# Patient Record
Sex: Female | Born: 1945
Health system: Southern US, Community
[De-identification: ages and names within clinical notes are randomized; demographics above are authoritative.]

## PROBLEM LIST (undated history)

## (undated) DIAGNOSIS — C50412 Malignant neoplasm of upper-outer quadrant of left female breast: Secondary | ICD-10-CM

## (undated) DIAGNOSIS — K5792 Diverticulitis of intestine, part unspecified, without perforation or abscess without bleeding: Secondary | ICD-10-CM

## (undated) DIAGNOSIS — T8859XA Other complications of anesthesia, initial encounter: Secondary | ICD-10-CM

## (undated) DIAGNOSIS — C50919 Malignant neoplasm of unspecified site of unspecified female breast: Secondary | ICD-10-CM

## (undated) DIAGNOSIS — Z9889 Other specified postprocedural states: Secondary | ICD-10-CM

## (undated) DIAGNOSIS — M899 Disorder of bone, unspecified: Secondary | ICD-10-CM

## (undated) DIAGNOSIS — K2100 Gastro-esophageal reflux disease with esophagitis, without bleeding: Secondary | ICD-10-CM

## (undated) DIAGNOSIS — M949 Disorder of cartilage, unspecified: Secondary | ICD-10-CM

## (undated) DIAGNOSIS — M545 Low back pain, unspecified: Secondary | ICD-10-CM

## (undated) DIAGNOSIS — IMO0001 Reserved for inherently not codable concepts without codable children: Secondary | ICD-10-CM

## (undated) DIAGNOSIS — M1990 Unspecified osteoarthritis, unspecified site: Secondary | ICD-10-CM

## (undated) DIAGNOSIS — E039 Hypothyroidism, unspecified: Secondary | ICD-10-CM

## (undated) DIAGNOSIS — M25559 Pain in unspecified hip: Secondary | ICD-10-CM

## (undated) DIAGNOSIS — R4701 Aphasia: Secondary | ICD-10-CM

## (undated) DIAGNOSIS — R0602 Shortness of breath: Secondary | ICD-10-CM

## (undated) DIAGNOSIS — N951 Menopausal and female climacteric states: Secondary | ICD-10-CM

## (undated) DIAGNOSIS — T4145XA Adverse effect of unspecified anesthetic, initial encounter: Secondary | ICD-10-CM

## (undated) HISTORY — PX: BREAST SURGERY: SHX581

## (undated) HISTORY — PX: TONSILLECTOMY: SUR1361

## (undated) HISTORY — DX: Diverticulitis of intestine, part unspecified, without perforation or abscess without bleeding: K57.92

## (undated) HISTORY — PX: ECTOPIC PREGNANCY SURGERY: SHX613

## (undated) HISTORY — PX: EYE SURGERY: SHX253

## (undated) HISTORY — PX: OVARIAN CYST SURGERY: SHX726

## (undated) HISTORY — DX: Menopausal and female climacteric states: N95.1

## (undated) HISTORY — DX: Low back pain, unspecified: M54.50

## (undated) HISTORY — DX: Low back pain: M54.5

## (undated) HISTORY — DX: Other specified postprocedural states: Z98.890

## (undated) HISTORY — PX: APPENDECTOMY: SHX54

## (undated) HISTORY — DX: Disorder of cartilage, unspecified: M94.9

## (undated) HISTORY — DX: Malignant neoplasm of unspecified site of unspecified female breast: C50.919

## (undated) HISTORY — DX: Disorder of bone, unspecified: M89.9

## (undated) HISTORY — DX: Malignant neoplasm of upper-outer quadrant of left female breast: C50.412

## (undated) HISTORY — DX: Unspecified osteoarthritis, unspecified site: M19.90

## (undated) HISTORY — DX: Aphasia: R47.01

## (undated) HISTORY — DX: Pain in unspecified hip: M25.559

## (undated) HISTORY — DX: Hypothyroidism, unspecified: E03.9

---

## 1976-04-29 HISTORY — PX: DILATION AND CURETTAGE OF UTERUS: SHX78

## 2003-08-26 ENCOUNTER — Ambulatory Visit (HOSPITAL_COMMUNITY): Admission: RE | Admit: 2003-08-26 | Discharge: 2003-08-26 | Payer: Self-pay | Admitting: *Deleted

## 2005-08-17 ENCOUNTER — Encounter: Admission: RE | Admit: 2005-08-17 | Discharge: 2005-08-17 | Payer: Self-pay | Admitting: Endocrinology

## 2010-04-29 HISTORY — PX: CATARACT EXTRACTION: SUR2

## 2010-07-31 ENCOUNTER — Other Ambulatory Visit: Payer: Self-pay | Admitting: Obstetrics and Gynecology

## 2010-08-07 ENCOUNTER — Ambulatory Visit (HOSPITAL_COMMUNITY)
Admission: RE | Admit: 2010-08-07 | Discharge: 2010-08-07 | Disposition: A | Discharge status: Home / Self Care | Payer: Medicare Other | Source: Ambulatory Visit | Attending: Obstetrics and Gynecology | Admitting: Obstetrics and Gynecology

## 2010-08-07 DIAGNOSIS — M47817 Spondylosis without myelopathy or radiculopathy, lumbosacral region: Secondary | ICD-10-CM | POA: Insufficient documentation

## 2010-08-07 DIAGNOSIS — R142 Eructation: Secondary | ICD-10-CM | POA: Insufficient documentation

## 2010-08-07 DIAGNOSIS — Q618 Other cystic kidney diseases: Secondary | ICD-10-CM | POA: Insufficient documentation

## 2010-08-07 DIAGNOSIS — R109 Unspecified abdominal pain: Secondary | ICD-10-CM | POA: Insufficient documentation

## 2010-08-07 DIAGNOSIS — R141 Gas pain: Secondary | ICD-10-CM | POA: Insufficient documentation

## 2010-08-07 DIAGNOSIS — I700 Atherosclerosis of aorta: Secondary | ICD-10-CM | POA: Insufficient documentation

## 2010-08-07 DIAGNOSIS — K5730 Diverticulosis of large intestine without perforation or abscess without bleeding: Secondary | ICD-10-CM | POA: Insufficient documentation

## 2010-08-07 MED ORDER — IOHEXOL 300 MG/ML  SOLN
100.0000 mL | Freq: Once | INTRAMUSCULAR | Status: AC | PRN
  Administered 2010-08-07: 100 mL via INTRAVENOUS

## 2011-04-30 DIAGNOSIS — Z9889 Other specified postprocedural states: Secondary | ICD-10-CM

## 2011-04-30 HISTORY — DX: Other specified postprocedural states: Z98.890

## 2011-06-19 DIAGNOSIS — Z1231 Encounter for screening mammogram for malignant neoplasm of breast: Secondary | ICD-10-CM | POA: Diagnosis not present

## 2011-06-20 DIAGNOSIS — R928 Other abnormal and inconclusive findings on diagnostic imaging of breast: Secondary | ICD-10-CM | POA: Diagnosis not present

## 2011-06-24 ENCOUNTER — Other Ambulatory Visit: Payer: Self-pay

## 2011-06-24 DIAGNOSIS — R928 Other abnormal and inconclusive findings on diagnostic imaging of breast: Secondary | ICD-10-CM | POA: Diagnosis not present

## 2011-06-24 DIAGNOSIS — Z0189 Encounter for other specified special examinations: Secondary | ICD-10-CM | POA: Diagnosis not present

## 2011-06-24 DIAGNOSIS — C50919 Malignant neoplasm of unspecified site of unspecified female breast: Secondary | ICD-10-CM | POA: Diagnosis not present

## 2011-06-25 ENCOUNTER — Other Ambulatory Visit: Payer: Self-pay | Admitting: Radiology

## 2011-06-25 DIAGNOSIS — D249 Benign neoplasm of unspecified breast: Secondary | ICD-10-CM | POA: Diagnosis not present

## 2011-06-25 DIAGNOSIS — C50912 Malignant neoplasm of unspecified site of left female breast: Secondary | ICD-10-CM

## 2011-06-27 ENCOUNTER — Other Ambulatory Visit: Payer: Self-pay | Admitting: *Deleted

## 2011-06-27 ENCOUNTER — Telehealth: Payer: Self-pay | Admitting: *Deleted

## 2011-06-27 DIAGNOSIS — C50419 Malignant neoplasm of upper-outer quadrant of unspecified female breast: Secondary | ICD-10-CM

## 2011-06-27 NOTE — Telephone Encounter (Signed)
Confirmed [facility] for 07/03/11 at 1200 .  Instructions and contact information given.  

## 2011-07-01 ENCOUNTER — Ambulatory Visit
Admission: RE | Admit: 2011-07-01 | Discharge: 2011-07-01 | Disposition: A | Discharge status: Home / Self Care | Payer: Medicare Other | Source: Ambulatory Visit | Attending: Radiology | Admitting: Radiology

## 2011-07-01 DIAGNOSIS — C50912 Malignant neoplasm of unspecified site of left female breast: Secondary | ICD-10-CM

## 2011-07-01 DIAGNOSIS — N63 Unspecified lump in unspecified breast: Secondary | ICD-10-CM | POA: Diagnosis not present

## 2011-07-01 DIAGNOSIS — C50919 Malignant neoplasm of unspecified site of unspecified female breast: Secondary | ICD-10-CM | POA: Diagnosis not present

## 2011-07-01 MED ORDER — GADOBENATE DIMEGLUMINE 529 MG/ML IV SOLN
14.0000 mL | Freq: Once | INTRAVENOUS | Status: AC | PRN
  Administered 2011-07-01: 14 mL via INTRAVENOUS

## 2011-07-02 ENCOUNTER — Other Ambulatory Visit: Payer: Self-pay | Admitting: Radiology

## 2011-07-02 DIAGNOSIS — R928 Other abnormal and inconclusive findings on diagnostic imaging of breast: Secondary | ICD-10-CM

## 2011-07-02 DIAGNOSIS — Z0189 Encounter for other specified special examinations: Secondary | ICD-10-CM | POA: Diagnosis not present

## 2011-07-02 DIAGNOSIS — C50919 Malignant neoplasm of unspecified site of unspecified female breast: Secondary | ICD-10-CM | POA: Diagnosis not present

## 2011-07-02 DIAGNOSIS — N63 Unspecified lump in unspecified breast: Secondary | ICD-10-CM | POA: Diagnosis not present

## 2011-07-02 DIAGNOSIS — D249 Benign neoplasm of unspecified breast: Secondary | ICD-10-CM | POA: Diagnosis not present

## 2011-07-03 ENCOUNTER — Ambulatory Visit (HOSPITAL_BASED_OUTPATIENT_CLINIC_OR_DEPARTMENT_OTHER): Payer: Medicare Other | Admitting: Oncology

## 2011-07-03 ENCOUNTER — Ambulatory Visit: Payer: Medicare Other

## 2011-07-03 ENCOUNTER — Encounter (INDEPENDENT_AMBULATORY_CARE_PROVIDER_SITE_OTHER): Payer: Self-pay | Admitting: General Surgery

## 2011-07-03 ENCOUNTER — Ambulatory Visit (HOSPITAL_BASED_OUTPATIENT_CLINIC_OR_DEPARTMENT_OTHER): Payer: Medicare Other | Admitting: General Surgery

## 2011-07-03 ENCOUNTER — Ambulatory Visit: Payer: Medicare Other | Attending: General Surgery | Admitting: Physical Therapy

## 2011-07-03 ENCOUNTER — Encounter: Payer: Self-pay | Admitting: Oncology

## 2011-07-03 ENCOUNTER — Ambulatory Visit
Admission: RE | Admit: 2011-07-03 | Discharge: 2011-07-03 | Disposition: A | Discharge status: Home / Self Care | Payer: Medicare Other | Source: Ambulatory Visit | Attending: Radiation Oncology | Admitting: Radiation Oncology

## 2011-07-03 ENCOUNTER — Other Ambulatory Visit (HOSPITAL_BASED_OUTPATIENT_CLINIC_OR_DEPARTMENT_OTHER): Payer: Medicare Other | Admitting: Lab

## 2011-07-03 VITALS — BP 138/78 | HR 76 | Temp 98.1°F | Ht 62.3 in | Wt 153.2 lb

## 2011-07-03 DIAGNOSIS — C50919 Malignant neoplasm of unspecified site of unspecified female breast: Secondary | ICD-10-CM | POA: Diagnosis not present

## 2011-07-03 DIAGNOSIS — D249 Benign neoplasm of unspecified breast: Secondary | ICD-10-CM | POA: Diagnosis not present

## 2011-07-03 DIAGNOSIS — R293 Abnormal posture: Secondary | ICD-10-CM | POA: Diagnosis not present

## 2011-07-03 DIAGNOSIS — C50419 Malignant neoplasm of upper-outer quadrant of unspecified female breast: Secondary | ICD-10-CM

## 2011-07-03 DIAGNOSIS — IMO0001 Reserved for inherently not codable concepts without codable children: Secondary | ICD-10-CM | POA: Diagnosis not present

## 2011-07-03 LAB — CBC WITH DIFFERENTIAL/PLATELET
BASO%: 0.5 % (ref 0.0–2.0)
Eosinophils Absolute: 0.1 10*3/uL (ref 0.0–0.5)
HCT: 40.9 % (ref 34.8–46.6)
MCHC: 33.5 g/dL (ref 31.5–36.0)
MONO%: 6.1 % (ref 0.0–14.0)
NEUT#: 2.8 10*3/uL (ref 1.5–6.5)
NEUT%: 60.9 % (ref 38.4–76.8)
RBC: 4.42 10*6/uL (ref 3.70–5.45)

## 2011-07-03 LAB — COMPREHENSIVE METABOLIC PANEL
ALT: 9 U/L (ref 0–35)
AST: 14 U/L (ref 0–37)
Alkaline Phosphatase: 60 U/L (ref 39–117)
CO2: 27 mEq/L (ref 19–32)
Calcium: 9.2 mg/dL (ref 8.4–10.5)
Chloride: 103 mEq/L (ref 96–112)
Creatinine, Ser: 0.77 mg/dL (ref 0.50–1.10)
Potassium: 4.3 mEq/L (ref 3.5–5.3)
Sodium: 138 mEq/L (ref 135–145)
Total Protein: 7.4 g/dL (ref 6.0–8.3)

## 2011-07-03 LAB — CANCER ANTIGEN 27.29: CA 27.29: 10 U/mL (ref 0–39)

## 2011-07-03 NOTE — Progress Notes (Signed)
Patient ID: Sylvia [name], female   DOB: 1945-10-27, 66 y.o.   MRN: [id number]  Chief Complaint  Patient presents with  . Other    Breast cancer    HPI Sylvia [name] is a 66 y.o. female.  Referred by Dr. Anselmo Pickler HPI This is a 67 yof who underwent a screening mammogram that showed a 1 cm mass in the lateral aspect of the left breast.  There are no right breast abnormalities.  She has no complaints referable to her breasts with no masses, discharge or tenderness.  Ultrasound shows a 13x10x8 mm mass.  She underwent ultrasound guided biopsy with clip placement.  Her pathology shows IDC with DCIS.  This is grade II.  This is ER positive 62%, PR negative, Ki67 is 12%, and Her2neu is not amplified.  She has also since undergone an MRI that showsa 2x1.5x1.5 cm irregular enhancing mass in the left breast.  There is an irregular 1.1x0.9x0.9cm located within the upper inner quadrant of left breast.  Also located centrally within the right breast in superior subareolar location at 12 o'clock is 1.3x0.6x0.6cm area of enhancement.  This has undergone a biopsy with finding of intraductal papilloma.  She comes in today to [facility] to discuss options.  Past Medical History  Diagnosis Date  . Hypothyroid     Past Surgical History  Procedure Date  . Cataract extraction 2012  . Abdominal hysterectomy   . Dilation and curettage of uterus 1978    Family History  Problem Relation Age of Onset  . Cancer Sister   . Cancer Brother   . Cancer Maternal Aunt   . Cancer Cousin   . Cancer Cousin     Social History History  Substance Use Topics  . Smoking status: Former Smoker -- 0.2 packs/day  . Smokeless tobacco: Former Neurosurgeon    Quit date: 04/29/1978  . Alcohol Use: No    Allergies  Allergen Reactions  . Benadryl (Diphenhydramine Hcl) Other (See Comments)    "Super hyper"  . Tape     Skin breakdown/redness   . Codeine Other (See Comments)    "can't wake up from it."    Current  Outpatient Prescriptions  Medication Sig Dispense Refill  . Calcium Carbonate-Vit D-Min (CALTRATE 600+D PLUS) 600-400 MG-UNIT per tablet Take 2 tablets by mouth daily.      . fish oil-omega-3 fatty acids 1000 MG capsule Take 2 g by mouth daily.      . Flaxseed, Linseed, (FLAXSEED OIL PO) Take 2 capsules by mouth daily.      Marland Kitchen GLUCOSA-CHONDR-NA CHONDR-MSM PO Take 2 tablets by mouth.      Marland Kitchen ibuprofen (ADVIL,MOTRIN) 100 MG tablet Take 200 mg by mouth every 6 (six) hours as needed.      Marland Kitchen levothyroxine (SYNTHROID, LEVOTHROID) 137 MCG tablet Take 137 mcg by mouth daily.        Review of Systems Review of Systems  Constitutional: Negative for fever, chills and unexpected weight change.  HENT: Positive for sinus pressure. Negative for hearing loss, congestion, sore throat, trouble swallowing and voice change.   Eyes: Negative for visual disturbance.  Respiratory: Positive for shortness of breath. Negative for cough and wheezing.   Cardiovascular: Negative for chest pain, palpitations and leg swelling.  Gastrointestinal: Negative for nausea, vomiting, abdominal pain, diarrhea, constipation, blood in stool, abdominal distention and anal bleeding.  Genitourinary: Negative for hematuria, vaginal bleeding and difficulty urinating.  Musculoskeletal: Positive for back pain. Negative for arthralgias.  Skin:  Negative for rash and wound.  Neurological: Negative for seizures, syncope and headaches.  Hematological: Negative for adenopathy. Does not bruise/bleed easily.  Psychiatric/Behavioral: Negative for confusion.    Wt Readings from Last 3 Encounters:  07/03/11 153 lb 3.2 oz (69.491 kg)   Temp Readings from Last 3 Encounters:  07/03/11 98.1 F (36.7 C)    BP Readings from Last 3 Encounters:  07/03/11 138/78   Pulse Readings from Last 3 Encounters:  07/03/11 76    Physical Exam Physical Exam  Vitals reviewed. Constitutional: She appears well-developed and well-nourished.  Neck: Neck  supple.  Cardiovascular: Normal rate, regular rhythm and normal heart sounds.   Pulmonary/Chest: Effort normal and breath sounds normal. She has no wheezes. She has no rales. Right breast exhibits no inverted nipple, no mass, no nipple discharge, no skin change and no tenderness. Left breast exhibits no inverted nipple, no mass, no nipple discharge, no skin change and no tenderness. Breasts are symmetrical.  Abdominal: Soft. There is no hepatomegaly.  Lymphadenopathy:    She has no cervical adenopathy.    Data Reviewed BILATERAL BREAST MRI WITH AND WITHOUT CONTRAST  Technique: Multiplanar, multisequence MR images of both breasts  were obtained prior to and following the intravenous administration  of 14ml of Multihance. Three dimensional images were evaluated at  the independent DynaCad workstation.  Comparison: 06/20/2011 and 06/23/2012 left breast ultrasound from  [facility] imaging. 06/27/2011, 06/13/2010, 05/01/2009, and 04/08/2008  digital mammograms from [facility] imaging.  Findings: There is diffuse bilateral multinodular parenchymal  enhancement with numerous tiny nodules present bilaterally . There  is a 2.0 x 1.5 x 1.5 cm oval mildly irregular enhancing mass with  central clip artifact located within the left breast located  slightly lateral and superior to the nipple at the 2 o'clock  position within the posterior third of the breast. This is  associated with plateau enhancement kinetics. This corresponds to  the recently biopsied left breast mass (ultrasound guidance). In  addition, there is an irregular 1.1 x 0.9 x 0.9 cm enhancing mass  located within the upper inner quadrant of the left breast with  plateau enhancement kinetics. This, also, is a worrisome mass and  second look ultrasound is recommended for further evaluation. If  no ultrasound correlate is found, and breast conservation is  planned, MR guided biopsy of this lesion is recommended.  Also, located centrally within  the right breast in a superior  subareolar location at the 12 o'clock position is an area of  nodular and clumped linear enhancement which measures 1.3 x 0.6 x  0.6 cm in size. This may represent an area of DCIS and second look  ultrasound of this area is recommended. If no ultrasound correlate  is found, MR guided biopsy is recommended.  IMPRESSION:  1. 2.0 cm enhancing mass within the left breast located at the 2  o'clock position with clip artifact corresponding to the recently  diagnosed invasive mammary carcinoma.  2. 1.1 cm irregular enhancing mass located within the upper inner  quadrant of the left breast. Second look ultrasound of this area  is recommended. If no ultrasound correlate is found, and breast  conservation is planned, recommend MR guided biopsy of this lesion.  3. 1.3 cm area of nodular and clumped linear enhancement located  within the superior subareolar portion of the right breast at 12  o'clock position worrisome for possible DCIS. Second look  ultrasound is recommended. If no ultrasound correlate is found, MR  guided biopsy is  recommended.    Assessment    Left breast cancer Right intraductal papilloma    Plan    She is due to get a left breast biopsy of the second lesion.  All of her options were discussed today but will wait until these final results are back. She does need a right breast wire guided excisional biopsy for the papilloma. We discussed this might be a cancer and that is reason for biopsy. We also discussed a left axillary sentinel node biopsy and the possibility of lumpectomy, lumpectomy and wire guided excisional biopsy of other area or mastectomy based on pathology from biopsy.   We discussed the staging and pathophysiology of breast cancer. We discussed all of the different options for treatment for breast cancer including surgery, chemotherapy, radiation therapy, Herceptin, and antiestrogen therapy.   We discussed a sentinel lymph node  biopsy as she does not appear to having lymph node involvement right now. We discussed the performance of that with injection of radioactive tracer and blue dye. We discussed that she would have an incision underneath her axillary hairline. We discussed that there is a bout a 10-20% chance of having a positive node with a sentinel lymph node biopsy and we will await the permanent pathology to make any other first further decisions in terms of her treatment. One of these options might be to return to the operating room to perform an axillary lymph node dissection. We discussed about a 1-2% risk lifetime of chronic shoulder pain as well as lymphedema associated with a sentinel lymph node biopsy.  We discussed the options for treatment of the breast cancer which included lumpectomy versus a mastectomy. We discussed the performance of the lumpectomy with a wire placement. We discussed a 10-20% chance of a positive margin requiring reexcision in the operating room. We also discussed that she may need radiation therapy or antiestrogen therapy or both if she undergoes lumpectomy. We discussed the mastectomy and the postoperative care for that as well. We discussed that there is no difference in her survival whether she undergoes lumpectomy with radiation therapy or antiestrogen therapy versus a mastectomy. There is a slight difference in the local recurrence rate being 3-5% with lumpectomy and about 1% with a mastectomy. We discussed the risks of operation including bleeding, infection, possible reoperation. She understands her further therapy will be based on what her stages at the time of her operation.         Welles Walthall 07/03/2011, 4:13 PM

## 2011-07-03 NOTE — Progress Notes (Signed)
Referral MD Dr Ernestina Patches Dr Karlene Lineman Dr Ewell Poe Reason for Referral: Abnormality detected on mammogram    Chief Complaint  Patient presents with  . Breast Cancer    HPI: 66 year old woman in good health, who presents with an abnormal mammogram and a  positive breast biopsy for cancer . She had a screening mammogram on 06/19/2011 which showed a 1 cm mass lateral aspect of the left breast. Ultrasound confirmed the presence of a 1.3 x 1 x 0.8 cm lobulated well-circumscribed mass at 3:00 4 cm from the nipple. Biopsy was recommended. Biopsy revealed a grade 2 ductal cancer ER +62% PR -0% proliferative index 12%. HER-2 was negative the ratio of 1. Bilateral MRI scan performed 07/01/2011 showed bilateral multinodular parenchymal enhancement with a 2 x 1.5 x 1.5 cm irregular enhancing mass left breast at the 2:00 position with corresponding to the recently biopsied mass. Initial enhancing mass was seen in the upper-outer quadrant of the left breast second look ultrasound is recommended. In addition in the right breast in 1.3 cm area of nodular clumped linear enhancement was located in the superior subareolar portion of the right breast second look ultrasound is recommended. A subsequent biopsy of the right side which showed intraductal papilloma.   Past Medical History  Diagnosis Date  . Hypothyroid   :  Past Surgical History  Procedure Date  . Cataract extraction 2012  . Abdominal hysterectomy   . Dilation and curettage of uterus 1978  :  Current outpatient prescriptions:Calcium Carbonate-Vit D-Min (CALTRATE 600+D PLUS) 600-400 MG-UNIT per tablet, Take 2 tablets by mouth daily., Disp: , Rfl: ;  fish oil-omega-3 fatty acids 1000 MG capsule, Take 2 g by mouth daily., Disp: , Rfl: ;  Flaxseed, Linseed, (FLAXSEED OIL PO), Take 2 capsules by mouth daily., Disp: , Rfl: ;  GLUCOSA-CHONDR-NA CHONDR-MSM PO, Take 2 tablets by mouth., Disp: , Rfl:  ibuprofen (ADVIL,MOTRIN) 100 MG tablet, Take 200 mg by mouth  every 6 (six) hours as needed., Disp: , Rfl: ;  levothyroxine (SYNTHROID, LEVOTHROID) 137 MCG tablet, Take 137 mcg by mouth daily., Disp: , Rfl: :    :  Allergies  Allergen Reactions  . Benadryl (Diphenhydramine Hcl) Other (See Comments)    "Super hyper"  . Tape     Skin breakdown/redness   . Codeine Other (See Comments)    "can't wake up from it."  :  Family History  Problem Relation Age of Onset  . Cancer Sister   . Cancer Brother   . Cancer breast Maternal Aunt in 13's   . Cancer breast Cousin 40-50   . Cancer Cousin   :  History   Social History  . 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 Years of Education: N/A   Occupational History  . - bookkeeper;    Social History Main Topics  . Smoking status: Former Smoker -- 0.2 packs/day  . Smokeless tobacco: Former Neurosurgeon    Quit date: 04/29/1978  . Alcohol Use: No  . Drug Use: No  . Sexually Active:    Other Topics Concern  . Not on file   Social History Narrative  . No narrative on file  :  @Reproductive  History Menarche age10 Menopause at hysterecotmy HRT x 7 y   A comprehensive review of systems was negative except for: Pt c/o soboe No recent weight changes or B symptoms  Exam:  @  IPVITALS@ General appearance: alert, cooperative and appears stated age Eyes: conjunctivae/corneas clear. PERRL, EOM's intact. Fundi benign. Throat: lips, mucosa, and tongue normal; teeth and gums normal Neck: no adenopathy, no carotid bruit, no JVD, supple, symmetrical, trachea midline and thyroid not enlarged, symmetric, no tenderness/mass/nodules Resp: clear to auscultation bilaterally and normal percussion bilaterally Chest wall: no tenderness Breasts: ecchymoses both breasts; fibrocystic type changes on both breasts Cardio: regular rate and rhythm, S1, S2 normal, no murmur, click, rub or gallop and normal apical impulse GI: soft,  non-tender; bowel sounds normal; no masses,  no organomegaly Extremities: extremities normal, atraumatic, no cyanosis or edema Pulses: 2+ and symmetric Lymph nodes: Cervical, supraclavicular, and axillary nodes normal. Neurologic: Grossly normal   Basename 07/03/11 1155  WBC 4.7  HGB 13.7  HCT 40.9  PLT 243    Basename 07/03/11 1155  NA 138  K 4.3  CL 103  CO2 27  GLUCOSE 90  BUN 9  CREATININE 0.77  CALCIUM 9.2    Blood smear review: n/a  Pathology: as above  Mr Breast Bilateral W Wo Contrast  07/01/2011  *RADIOLOGY REPORT*  Clinical Data: Recently diagnosed left breast invasive mammary carcinoma on ultrasound guided core biopsy.  Preoperative evaluation.  BUN and creatinine were obtained on site at [facility] at [address] Results:  BUN 8 mg/dL,  Creatinine 0.8 mg/dL.  BILATERAL BREAST MRI WITH AND WITHOUT CONTRAST  Technique: Multiplanar, multisequence MR images of both breasts were obtained prior to and following the intravenous administration of 14ml of Multihance.  Three dimensional images were evaluated at the independent DynaCad workstation.  Comparison:  06/20/2011 and 06/23/2012 left breast ultrasound from [facility] imaging.  06/27/2011, 06/13/2010, 05/01/2009, and 04/08/2008 digital mammograms from [facility] imaging.  Findings: There is diffuse bilateral multinodular parenchymal enhancement with numerous tiny nodules present bilaterally . There is a 2.0 x 1.5 x 1.5 cm oval mildly irregular enhancing mass with central clip artifact located within the left breast located slightly lateral and superior to the nipple at the 2 o'clock position within the posterior third of the breast. This is associated with plateau enhancement kinetics. This corresponds to the recently biopsied left breast mass (ultrasound guidance). In addition, there is an irregular 1.1 x 0.9 x 0.9 cm enhancing mass located within the upper inner quadrant of the left breast with plateau enhancement  kinetics.  This, also, is a worrisome mass and second look ultrasound is recommended for further evaluation.  If no ultrasound correlate is found,  and breast conservation is planned,  MR guided biopsy of this lesion is recommended.  Also, located centrally within the right breast in a superior subareolar location at the 12 o'clock position is an area of nodular and clumped linear enhancement which measures 1.3 x 0.6 x 0.6 cm in size.  This may represent an area of DCIS and second look ultrasound of this area is recommended.  If no ultrasound correlate is found, MR guided biopsy is recommended.  IMPRESSION:  1.  2.0 cm enhancing mass within the left breast located at the 2 o'clock position with clip artifact corresponding to the recently diagnosed invasive mammary carcinoma. 2.  1.1 cm irregular enhancing mass located within the upper inner quadrant of the left breast.  Second look ultrasound of this area is recommended.  If no ultrasound correlate is found, and breast conservation is planned, recommend MR guided biopsy of this lesion. 3.  1.3 cm area of nodular and clumped linear enhancement located within the superior subareolar  portion of the right breast at 12 o'clock position worrisome for possible DCIS.  Second look ultrasound is recommended.  If no ultrasound correlate is found, MR guided biopsy is recommended.  THREE-DIMENSIONAL MR IMAGE RENDERING ON INDEPENDENT WORKSTATION:  Three-dimensional MR images were rendered by post-processing of the original MR data on an independent workstation.  The three- dimensional MR images were interpreted, and findings were reported in the accompanying complete MRI report for this study.  BI-RADS CATEGORY 4:  Suspicious abnormality - biopsy should be considered.  Original Report Authenticated By: Rolla Plate, M.D.    Assessment and Plan:  66 year old woman with history of left-sided ER/PR positive invasive breast cancer. There is an additional lesion in the upper  quadrant that requires biopsy. In contralateral breast there is a papilloma needs to be resected as well. I spent about 45 minutes today with the patient and her family discussed the role of adjuvant chemotherapy and hormonal therapy.  In addition I outlined the role of the Oncotype test that would be performed on her invasive specimen to determine whether or not she would benefit from chemotherapy.  I plan to see her back in followup after she's had definitive surgery and Oncotype testing. In the interim we'll go ahead with a followup bone density test. She may also benefit from staging scans and we'll await results of her final pathology before ordering those.   Pierce Crane M.D. FRCP C.  45 minutes was spent with this patient half the time and patient-related counseling

## 2011-07-03 NOTE — Progress Notes (Signed)
Addended byEmelia Loron on: 07/03/2011 04:39 PM   Modules accepted: Orders

## 2011-07-03 NOTE — Progress Notes (Signed)
[facility] Radiation Oncology NEW PATIENT EVALUATION  Name: Sylvia [name] MRN: [id number]  Date: 07/03/2011  DOB: Nov 28, 1945  Status: outpatient   CC:   Emelia Loron, MD , Dr. Pierce Crane, Dr. Bufford Spikes, Dr. Kirkland Hun   REFERRING PHYSICIAN: Emelia Loron, MD   DIAGNOSIS: Stage I A. (T1, N0, M0) invasive ductal carcinoma the left breast    HISTORY OF PRESENT ILLNESS:  Sylvia [name] is a 66 y.o. female who is seen today at the [facility] for evaluation of her stage I 8 (T1, N0, M0) invasive ductal carcinoma the left breast. At the time of a screening mammogram at [facility] on 06/19/2011 she is node have a 1 cm mass along lateral aspect of left breast. Additional views and ultrasound showed a 1.3 x 1.0 x 0.8 cm mass along the lateral aspect of left breast at 3:00, 4 cm from the nipple. Ultrasound-guided biopsy on 06/24/2011 was diagnostic for invasive ductal carcinoma/DCIS which was strongly ER positive at 60%, PR negative with a proliferation marker/Ki-67 of 12%. Breast MRI on 07/01/2011 showed a 2.0 cm enhancing mass with the left breast located at 2:00 with clip artifact. In addition there was a 1.1 cm enhancing mass within the upper inner quadrant of the left breast a 1.3 cm nodular a clump linear enhancement located within the superior subareolar portion of the right breast at 12:00 worrisome for possible DCIS. The right breast was biopsied on 07/02/2011 and found to be consistent with an intraductal papilloma with microcalcifications. The second lesion within left breast is scheduled for MRI guided biopsy on March 8. Dr. Dwain Sarna feels that the right breast lesion should be excised to rule out the possibility of carcinoma. Of note is that she does report recent dyspnea on exertion and has not communicated this to her family.   PREVIOUS RADIATION THERAPY: No   PAST MEDICAL HISTORY:  has a past medical history of Hypothyroid.     PAST SURGICAL HISTORY:    Past Surgical History  Procedure Date  . Cataract extraction 2012  . Abdominal hysterectomy   . Dilation and curettage of uterus 1978     FAMILY HISTORY: family history includes Cancer in her brother, cousins, maternal aunt, and sister. Her father died of lung cancer at age 73. Her mother died from leukemia at age 54. There is a positive family history for breast cancer on her maternal side.   SOCIAL HISTORY:  reports that she has quit smoking. She quit smokeless tobacco use about 33 years ago. She reports that she does not drink alcohol or use illicit drugs.   ALLERGIES: Benadryl; Tape; and Codeine   MEDICATIONS:  Current Outpatient Prescriptions  Medication Sig Dispense Refill  . Calcium Carbonate-Vit D-Min (CALTRATE 600+D PLUS) 600-400 MG-UNIT per tablet Take 2 tablets by mouth daily.      . fish oil-omega-3 fatty acids 1000 MG capsule Take 2 g by mouth daily.      . Flaxseed, Linseed, (FLAXSEED OIL PO) Take 2 capsules by mouth daily.      Marland Kitchen GLUCOSA-CHONDR-NA CHONDR-MSM PO Take 2 tablets by mouth.      Marland Kitchen ibuprofen (ADVIL,MOTRIN) 100 MG tablet Take 200 mg by mouth every 6 (six) hours as needed.      Marland Kitchen levothyroxine (SYNTHROID, LEVOTHROID) 137 MCG tablet Take 137 mcg by mouth daily.          REVIEW OF SYSTEMS:  Pertinent items are noted in HPI.    PHYSICAL EXAM:  Alert  and oriented 66 year old white female appearing her stated age. Vital signs: Blood pressure 138/78, pulse 76, respiratory rate 20, temperature 98.1  Head and neck examination: Grossly unremarkable. Nodes: Without palpable cervical, supraclavicular, or axillary lymphadenopathy. Chest: Lungs clear. Heart: Regular rate and rhythm. Back: Without spinal or CVA tenderness. Breasts:: Right breast is a biopsy wound at approximately 9:00. No masses are appreciated. Left breast is a biopsy wound along the lateral aspect of the left breast. No discreet masses are appreciated. Abdomen: Without masses organomegaly.  Extremities: Without edema. Neurologic examination: Grossly nonfocal.    LABORATORY DATA:  Lab Results  Component Value Date   WBC 4.7 07/03/2011   HGB 13.7 07/03/2011   HCT 40.9 07/03/2011   MCV 92.4 07/03/2011   PLT 243 07/03/2011   Lab Results  Component Value Date   NA 138 07/03/2011   K 4.3 07/03/2011   CL 103 07/03/2011   CO2 27 07/03/2011   Lab Results  Component Value Date   ALT 9 07/03/2011   AST 14 07/03/2011   ALKPHOS 60 07/03/2011   BILITOT 0.4 07/03/2011      IMPRESSION: Stage I 8 (T1, N0, M0) invasive ductal carcinoma of the left breast. I explained to the patient and her family that her local treatment options include mastectomy versus partial mastectomy followed by radiation therapy. She will of the head with her MRI guided biopsy, and if this is benign, then she is a candidate for breast preservation. If this is malignant then she will be offered mastectomy, and probably not require post mastectomy radiation therapy. I reviewed the potential acute and late toxicities of radiation therapy.   PLAN: As discussed above. She'll make a decision regarding her desire for breast preservation following her left breast biopsy. Dr. Dwain Sarna will have her dyspnea on exertion evaluated prior to definitive surgery/anesthesia. I can see her for a followup visit after possible partial mastectomy.   I spent 40 minutes minutes face to face with the patient and more than 50% of that time was spent in counseling and/or coordination of care.

## 2011-07-04 ENCOUNTER — Telehealth (INDEPENDENT_AMBULATORY_CARE_PROVIDER_SITE_OTHER): Payer: Self-pay

## 2011-07-04 ENCOUNTER — Telehealth: Payer: Self-pay | Admitting: Oncology

## 2011-07-04 DIAGNOSIS — C50919 Malignant neoplasm of unspecified site of unspecified female breast: Secondary | ICD-10-CM

## 2011-07-04 NOTE — Telephone Encounter (Signed)
S/w the pt regarding her April 2013 appt along with the bone density appt at [facility]

## 2011-07-04 NOTE — Telephone Encounter (Signed)
Called pt to notify her that we made her a f/u appt to come back and see Dr Dwain Sarna on 07-09-11 to discuss her 2nd bx. I also made an appt for pt to see Dr Donato Schultz on 07-17-11 arrive at 2:30 for 3:00. I will fax records to their office for the appt. I notified Dr Renaldo Fiddler of the appt and he will email Dr Anne Fu. The pt made aware and understands.

## 2011-07-05 ENCOUNTER — Ambulatory Visit
Admission: RE | Admit: 2011-07-05 | Discharge: 2011-07-05 | Disposition: A | Discharge status: Home / Self Care | Payer: Medicare Other | Source: Ambulatory Visit | Attending: Radiology | Admitting: Radiology

## 2011-07-05 DIAGNOSIS — N6089 Other benign mammary dysplasias of unspecified breast: Secondary | ICD-10-CM | POA: Diagnosis not present

## 2011-07-05 DIAGNOSIS — R928 Other abnormal and inconclusive findings on diagnostic imaging of breast: Secondary | ICD-10-CM

## 2011-07-05 DIAGNOSIS — N6019 Diffuse cystic mastopathy of unspecified breast: Secondary | ICD-10-CM | POA: Diagnosis not present

## 2011-07-05 DIAGNOSIS — C50919 Malignant neoplasm of unspecified site of unspecified female breast: Secondary | ICD-10-CM | POA: Diagnosis not present

## 2011-07-05 MED ORDER — GADOBENATE DIMEGLUMINE 529 MG/ML IV SOLN
14.0000 mL | Freq: Once | INTRAVENOUS | Status: AC | PRN
  Administered 2011-07-05: 14 mL via INTRAVENOUS

## 2011-07-08 ENCOUNTER — Telehealth: Payer: Self-pay | Admitting: *Deleted

## 2011-07-08 ENCOUNTER — Encounter (INDEPENDENT_AMBULATORY_CARE_PROVIDER_SITE_OTHER): Payer: Self-pay | Admitting: General Surgery

## 2011-07-08 NOTE — Telephone Encounter (Signed)
Spoke to pt concerning [facility] from 3/6.  Pt denies questions or concerns regarding dx or treatment care plan.  Pt will see Dr. Dwain Sarna 3/12 to finalize surgery plan.  Encourage pt to call with needs.  Received verbal understanding.  Contact information given.

## 2011-07-09 ENCOUNTER — Ambulatory Visit (INDEPENDENT_AMBULATORY_CARE_PROVIDER_SITE_OTHER): Payer: Medicare Other | Admitting: General Surgery

## 2011-07-09 ENCOUNTER — Encounter (INDEPENDENT_AMBULATORY_CARE_PROVIDER_SITE_OTHER): Payer: Self-pay | Admitting: General Surgery

## 2011-07-09 VITALS — BP 134/88 | HR 75 | Temp 98.8°F | Ht 62.0 in | Wt 154.6 lb

## 2011-07-09 DIAGNOSIS — M949 Disorder of cartilage, unspecified: Secondary | ICD-10-CM | POA: Diagnosis not present

## 2011-07-09 DIAGNOSIS — C50419 Malignant neoplasm of upper-outer quadrant of unspecified female breast: Secondary | ICD-10-CM | POA: Diagnosis not present

## 2011-07-09 NOTE — Progress Notes (Signed)
Subjective:     Patient ID: Sylvia [name], female   DOB: 1945-07-07, 66 y.o.   MRN: [id number]  HPI This is a 66 year old female I saw her recently at the multidisciplinary clinic. She had a biopsy-proven left breast cancer in a right-sided intraductal papilloma at that time. An MRI showed an additional lesion in the upper inner quadrant of her left breast. This has since undergone an MRI guided biopsy which shows fibrocystic changes with usual ductal hyperplasia. This has been recommended for followup and does not need to be excised as it is it is been deemed concordant with the appearance. She returns today to discuss all of her surgical options which we have gone over previously in the multidisciplinary clinic. Additionally she has a new onset shortness of breath that she is concerned about and is sleeping on pillows that she is due to see cardiology on the 20th.  Review of Systems     Objective:   Physical Exam Deferred today     Assessment:     Left breast cancer    Plan:     We discussed pathology and options.  We discussed again lumpectomy vs mastectomy.  We discussed need for xrt after lumpectomy as well as positive margins requiring further surgery.  She would like to proceed with left breast lumpectomy, left ax snbx and right breast wire guided biopsy.  She is due to see cardiology for new onset sob and then I will schedule her.

## 2011-07-09 NOTE — Telephone Encounter (Signed)
error 

## 2011-07-09 NOTE — Patient Instructions (Signed)
We will schedule once seen by Dr. Anne Fu

## 2011-07-16 ENCOUNTER — Other Ambulatory Visit (INDEPENDENT_AMBULATORY_CARE_PROVIDER_SITE_OTHER): Payer: Self-pay | Admitting: General Surgery

## 2011-07-16 DIAGNOSIS — C50419 Malignant neoplasm of upper-outer quadrant of unspecified female breast: Secondary | ICD-10-CM

## 2011-07-17 ENCOUNTER — Telehealth (INDEPENDENT_AMBULATORY_CARE_PROVIDER_SITE_OTHER): Payer: Self-pay

## 2011-07-17 DIAGNOSIS — R0602 Shortness of breath: Secondary | ICD-10-CM | POA: Diagnosis not present

## 2011-07-17 DIAGNOSIS — R0789 Other chest pain: Secondary | ICD-10-CM | POA: Diagnosis not present

## 2011-07-17 DIAGNOSIS — Z01810 Encounter for preprocedural cardiovascular examination: Secondary | ICD-10-CM | POA: Diagnosis not present

## 2011-07-17 DIAGNOSIS — C50919 Malignant neoplasm of unspecified site of unspecified female breast: Secondary | ICD-10-CM | POA: Diagnosis not present

## 2011-07-17 NOTE — Telephone Encounter (Signed)
Dr Judd Gaudier nurse called to let me know that the pt has not been cleared yet b/c Dr Anne Fu has ordered some test on the pt. Dr Anne Fu wants to see the pt back in his office on 07-23-11. I notified the nurse that Dr Dwain Sarna was trying to do sx on 07-26-11 and would the pt be cleared by 07-24-11. The nurse will let Dr Anne Fu know what we are trying to plan for the pt. The nurse will call me on the 07-24-11 to let me know of the clearance.

## 2011-07-17 NOTE — Telephone Encounter (Signed)
LMOM for someone to call me back since im checking on the status of cardiac clearance. I am trying to schedule the surgery ASAP per Dr Dwain Sarna.

## 2011-07-17 NOTE — Telephone Encounter (Signed)
Can we just schedule for the 29th and cancel if we need to please?

## 2011-07-18 DIAGNOSIS — C50919 Malignant neoplasm of unspecified site of unspecified female breast: Secondary | ICD-10-CM | POA: Diagnosis not present

## 2011-07-18 DIAGNOSIS — R0789 Other chest pain: Secondary | ICD-10-CM | POA: Diagnosis not present

## 2011-07-18 DIAGNOSIS — Z01810 Encounter for preprocedural cardiovascular examination: Secondary | ICD-10-CM | POA: Diagnosis not present

## 2011-07-18 DIAGNOSIS — R0602 Shortness of breath: Secondary | ICD-10-CM | POA: Diagnosis not present

## 2011-07-19 ENCOUNTER — Encounter (HOSPITAL_COMMUNITY): Payer: Self-pay | Admitting: Pharmacy Technician

## 2011-07-22 ENCOUNTER — Encounter: Payer: Self-pay | Admitting: *Deleted

## 2011-07-22 NOTE — Progress Notes (Signed)
Mailed after appt letter to pt. 

## 2011-07-23 ENCOUNTER — Encounter (HOSPITAL_COMMUNITY): Payer: Self-pay

## 2011-07-23 ENCOUNTER — Encounter (HOSPITAL_COMMUNITY)
Admission: RE | Admit: 2011-07-23 | Discharge: 2011-07-23 | Disposition: A | Discharge status: Home / Self Care | Payer: Medicare Other | Source: Ambulatory Visit | Attending: General Surgery | Admitting: General Surgery

## 2011-07-23 DIAGNOSIS — D249 Benign neoplasm of unspecified breast: Secondary | ICD-10-CM | POA: Diagnosis not present

## 2011-07-23 DIAGNOSIS — Z01811 Encounter for preprocedural respiratory examination: Secondary | ICD-10-CM | POA: Diagnosis not present

## 2011-07-23 DIAGNOSIS — Z01818 Encounter for other preprocedural examination: Secondary | ICD-10-CM | POA: Diagnosis not present

## 2011-07-23 DIAGNOSIS — E039 Hypothyroidism, unspecified: Secondary | ICD-10-CM | POA: Diagnosis not present

## 2011-07-23 DIAGNOSIS — Z01812 Encounter for preprocedural laboratory examination: Secondary | ICD-10-CM | POA: Diagnosis not present

## 2011-07-23 DIAGNOSIS — D0590 Unspecified type of carcinoma in situ of unspecified breast: Secondary | ICD-10-CM | POA: Diagnosis not present

## 2011-07-23 DIAGNOSIS — C50919 Malignant neoplasm of unspecified site of unspecified female breast: Secondary | ICD-10-CM | POA: Diagnosis not present

## 2011-07-23 HISTORY — DX: Gastro-esophageal reflux disease with esophagitis, without bleeding: K21.00

## 2011-07-23 HISTORY — DX: Other complications of anesthesia, initial encounter: T88.59XA

## 2011-07-23 HISTORY — DX: Shortness of breath: R06.02

## 2011-07-23 HISTORY — DX: Reserved for inherently not codable concepts without codable children: IMO0001

## 2011-07-23 HISTORY — DX: Unspecified osteoarthritis, unspecified site: M19.90

## 2011-07-23 HISTORY — DX: Gastro-esophageal reflux disease with esophagitis: K21.0

## 2011-07-23 HISTORY — DX: Adverse effect of unspecified anesthetic, initial encounter: T41.45XA

## 2011-07-23 LAB — SURGICAL PCR SCREEN
MRSA, PCR: NEGATIVE
Staphylococcus aureus: NEGATIVE

## 2011-07-23 LAB — CBC
HCT: 39.1 % (ref 36.0–46.0)
Platelets: 209 10*3/uL (ref 150–400)
RDW: 13 % (ref 11.5–15.5)
WBC: 4.1 10*3/uL (ref 4.0–10.5)

## 2011-07-23 LAB — BASIC METABOLIC PANEL
Chloride: 105 mEq/L (ref 96–112)
Creatinine, Ser: 0.66 mg/dL (ref 0.50–1.10)
GFR calc Af Amer: >90 mL/min (ref >90)
Sodium: 140 mEq/L (ref 135–145)

## 2011-07-23 NOTE — Progress Notes (Signed)
Call to [facility]. , left msg. with voicemail to send most recent OV note, ekg, stress test.

## 2011-07-23 NOTE — Pre-Procedure Instructions (Signed)
20 Sylvia [name]  07/23/2011   Your procedure is scheduled on:  07/26/2011  Report to [facility] at 8:00 a.m.   Call this number if you have problems the morning of surgery: [phone]   Remember:   Do not eat food:After Midnight.  May have clear liquids: up to 4 Hours before arrival.  Clear liquids include soda, tea, black coffee, apple or grape juice, broth.  Take these medicines the morning of surgery with A SIP OF WATER: SYNTHROID   Do not wear jewelry, make-up or nail polish.  Do not wear lotions, powders, or perfumes. You may wear deodorant.  Do not shave 48 hours prior to surgery.  Do not bring valuables to the hospital.  Contacts, dentures or bridgework may not be worn into surgery.  Leave suitcase in the car. After surgery it may be brought to your room.  For patients admitted to the hospital, checkout time is 11:00 AM the day of discharge.   Patients discharged the day of surgery will not be allowed to drive home.  Name and phone number of your driver: with family  Special Instructions: CHG Shower Use Special Wash: 1/2 bottle night before surgery and 1/2 bottle morning of surgery.   Please read over the following fact sheets that you were given: Pain Booklet, Coughing and Deep Breathing, MRSA Information and Surgical Site Infection Prevention

## 2011-07-23 NOTE — Progress Notes (Signed)
Call for cardiac records, [facility] on ans. Service, will try later.

## 2011-07-24 ENCOUNTER — Encounter (INDEPENDENT_AMBULATORY_CARE_PROVIDER_SITE_OTHER): Payer: Self-pay

## 2011-07-25 MED ORDER — CEFAZOLIN SODIUM 1-5 GM-% IV SOLN
1.0000 g | INTRAVENOUS | Status: AC
  Administered 2011-07-26: 1 g via INTRAVENOUS
  Filled 2011-07-25: qty 50

## 2011-07-26 ENCOUNTER — Ambulatory Visit (HOSPITAL_COMMUNITY)
Admission: RE | Admit: 2011-07-26 | Discharge: 2011-07-26 | Disposition: A | Discharge status: Home / Self Care | Payer: Medicare Other | Source: Ambulatory Visit | Attending: General Surgery | Admitting: General Surgery

## 2011-07-26 ENCOUNTER — Encounter (HOSPITAL_COMMUNITY): Payer: Self-pay | Admitting: Anesthesiology

## 2011-07-26 ENCOUNTER — Ambulatory Visit (HOSPITAL_COMMUNITY): Payer: Medicare Other | Admitting: Anesthesiology

## 2011-07-26 ENCOUNTER — Encounter (HOSPITAL_COMMUNITY)
Admission: RE | Disposition: A | Discharge status: Home / Self Care | Payer: Self-pay | Source: Ambulatory Visit | Attending: General Surgery

## 2011-07-26 DIAGNOSIS — D249 Benign neoplasm of unspecified breast: Secondary | ICD-10-CM | POA: Insufficient documentation

## 2011-07-26 DIAGNOSIS — D0590 Unspecified type of carcinoma in situ of unspecified breast: Secondary | ICD-10-CM | POA: Insufficient documentation

## 2011-07-26 DIAGNOSIS — C50919 Malignant neoplasm of unspecified site of unspecified female breast: Secondary | ICD-10-CM | POA: Diagnosis not present

## 2011-07-26 DIAGNOSIS — Z0189 Encounter for other specified special examinations: Secondary | ICD-10-CM | POA: Diagnosis not present

## 2011-07-26 DIAGNOSIS — E039 Hypothyroidism, unspecified: Secondary | ICD-10-CM | POA: Diagnosis not present

## 2011-07-26 DIAGNOSIS — N6019 Diffuse cystic mastopathy of unspecified breast: Secondary | ICD-10-CM | POA: Diagnosis not present

## 2011-07-26 DIAGNOSIS — Z01818 Encounter for other preprocedural examination: Secondary | ICD-10-CM | POA: Insufficient documentation

## 2011-07-26 DIAGNOSIS — N63 Unspecified lump in unspecified breast: Secondary | ICD-10-CM | POA: Diagnosis not present

## 2011-07-26 DIAGNOSIS — Z01812 Encounter for preprocedural laboratory examination: Secondary | ICD-10-CM | POA: Diagnosis not present

## 2011-07-26 DIAGNOSIS — C50419 Malignant neoplasm of upper-outer quadrant of unspecified female breast: Secondary | ICD-10-CM

## 2011-07-26 HISTORY — PX: BREAST BIOPSY: SHX20

## 2011-07-26 SURGERY — BREAST LUMPECTOMY WITH NEEDLE LOCALIZATION AND AXILLARY SENTINEL LYMPH NODE BX
Anesthesia: General | Site: Breast | Laterality: Right | Wound class: Clean

## 2011-07-26 MED ORDER — MEPERIDINE HCL 25 MG/ML IJ SOLN: 12.5000 mg | Freq: Once | INTRAMUSCULAR | Status: DC

## 2011-07-26 MED ORDER — 0.9 % SODIUM CHLORIDE (POUR BTL) OPTIME
TOPICAL | Status: DC | PRN
  Administered 2011-07-26: 1000 mL

## 2011-07-26 MED ORDER — PROPOFOL 10 MG/ML IV EMUL
INTRAVENOUS | Status: DC | PRN
  Administered 2011-07-26: 150 mg via INTRAVENOUS

## 2011-07-26 MED ORDER — METHYLENE BLUE 1 % INJ SOLN
INTRAMUSCULAR | Status: DC | PRN
  Administered 2011-07-26: 2 mL

## 2011-07-26 MED ORDER — LACTATED RINGERS IV SOLN
INTRAVENOUS | Status: DC | PRN
  Administered 2011-07-26 (×2): via INTRAVENOUS

## 2011-07-26 MED ORDER — ONDANSETRON HCL 4 MG/2ML IJ SOLN: 4.0000 mg | Freq: Once | INTRAMUSCULAR | Status: DC | PRN

## 2011-07-26 MED ORDER — FENTANYL CITRATE 0.05 MG/ML IJ SOLN
50.0000 ug | INTRAMUSCULAR | Status: DC | PRN
  Administered 2011-07-26: 100 ug via INTRAVENOUS

## 2011-07-26 MED ORDER — ONDANSETRON HCL 4 MG/2ML IJ SOLN
INTRAMUSCULAR | Status: DC | PRN
  Administered 2011-07-26: 4 mg via INTRAVENOUS

## 2011-07-26 MED ORDER — MIDAZOLAM HCL 2 MG/2ML IJ SOLN
INTRAMUSCULAR | Status: AC
  Filled 2011-07-26: qty 2

## 2011-07-26 MED ORDER — FENTANYL CITRATE 0.05 MG/ML IJ SOLN
INTRAMUSCULAR | Status: AC
  Filled 2011-07-26: qty 2

## 2011-07-26 MED ORDER — FENTANYL CITRATE 0.05 MG/ML IJ SOLN
INTRAMUSCULAR | Status: DC | PRN
  Administered 2011-07-26: 50 ug via INTRAVENOUS
  Administered 2011-07-26: 100 ug via INTRAVENOUS
  Administered 2011-07-26: 50 ug via INTRAVENOUS

## 2011-07-26 MED ORDER — BUPIVACAINE-EPINEPHRINE 0.25% -1:200000 IJ SOLN
INTRAMUSCULAR | Status: DC | PRN
  Administered 2011-07-26: 30 mL

## 2011-07-26 MED ORDER — MIDAZOLAM HCL 2 MG/2ML IJ SOLN
1.0000 mg | INTRAMUSCULAR | Status: DC | PRN
  Administered 2011-07-26: 2 mg via INTRAVENOUS

## 2011-07-26 MED ORDER — OXYCODONE-ACETAMINOPHEN 5-325 MG PO TABS: 1.0000 | ORAL_TABLET | ORAL | Status: AC | PRN

## 2011-07-26 MED ORDER — LACTATED RINGERS IV SOLN
INTRAVENOUS | Status: DC
  Administered 2011-07-26: 10:00:00 via INTRAVENOUS

## 2011-07-26 MED ORDER — SODIUM CHLORIDE 0.9 % IJ SOLN
INTRAMUSCULAR | Status: DC | PRN
  Administered 2011-07-26: 3 mL via INTRAVENOUS

## 2011-07-26 MED ORDER — TECHNETIUM TC 99M SULFUR COLLOID FILTERED
1.0000 | Freq: Once | INTRAVENOUS | Status: AC | PRN
  Administered 2011-07-26: 1 via INTRADERMAL

## 2011-07-26 MED ORDER — HYDROMORPHONE HCL PF 1 MG/ML IJ SOLN
0.2500 mg | INTRAMUSCULAR | Status: DC | PRN
  Administered 2011-07-26 (×2): 0.5 mg via INTRAVENOUS

## 2011-07-26 SURGICAL SUPPLY — 61 items
ADH SKN CLS APL DERMABOND .7 (GAUZE/BANDAGES/DRESSINGS)
APL SKNCLS STERI-STRIP NONHPOA (GAUZE/BANDAGES/DRESSINGS) ×4
APPLIER CLIP 9.375 MED OPEN (MISCELLANEOUS) ×3
APR CLP MED 9.3 20 MLT OPN (MISCELLANEOUS) ×2
BENZOIN TINCTURE PRP APPL 2/3 (GAUZE/BANDAGES/DRESSINGS) ×2 IMPLANT
BINDER BREAST LRG (GAUZE/BANDAGES/DRESSINGS) IMPLANT
BINDER BREAST XLRG (GAUZE/BANDAGES/DRESSINGS) ×1 IMPLANT
BLADE SURG 10 STRL SS (BLADE) ×3 IMPLANT
BLADE SURG 15 STRL LF DISP TIS (BLADE) ×2 IMPLANT
BLADE SURG 15 STRL SS (BLADE) ×3
CANISTER SUCTION 2500CC (MISCELLANEOUS) ×3 IMPLANT
CHLORAPREP W/TINT 26ML (MISCELLANEOUS) ×3 IMPLANT
CLIP APPLIE 9.375 MED OPEN (MISCELLANEOUS) ×2 IMPLANT
CLOTH BEACON ORANGE TIMEOUT ST (SAFETY) ×3 IMPLANT
CONT SPEC STER OR (MISCELLANEOUS) ×3 IMPLANT
COVER PROBE W GEL 5X96 (DRAPES) ×3 IMPLANT
COVER SURGICAL LIGHT HANDLE (MISCELLANEOUS) ×3 IMPLANT
DECANTER SPIKE VIAL GLASS SM (MISCELLANEOUS) ×3 IMPLANT
DERMABOND ADVANCED (GAUZE/BANDAGES/DRESSINGS)
DERMABOND ADVANCED .7 DNX12 (GAUZE/BANDAGES/DRESSINGS) IMPLANT
DEVICE DUBIN SPECIMEN MAMMOGRA (MISCELLANEOUS) ×3 IMPLANT
DRAPE CHEST BREAST 15X10 FENES (DRAPES) ×3 IMPLANT
ELECT CAUTERY BLADE 6.4 (BLADE) ×3 IMPLANT
ELECT REM PT RETURN 9FT ADLT (ELECTROSURGICAL) ×3
ELECTRODE REM PT RTRN 9FT ADLT (ELECTROSURGICAL) ×2 IMPLANT
GLOVE BIO SURGEON STRL SZ7 (GLOVE) ×3 IMPLANT
GLOVE BIOGEL PI IND STRL 7.0 (GLOVE) IMPLANT
GLOVE BIOGEL PI IND STRL 7.5 (GLOVE) ×2 IMPLANT
GLOVE BIOGEL PI INDICATOR 7.0 (GLOVE) ×2
GLOVE BIOGEL PI INDICATOR 7.5 (GLOVE) ×1
GLOVE SURG SS PI 6.5 STRL IVOR (GLOVE) ×1 IMPLANT
GOWN STRL NON-REIN LRG LVL3 (GOWN DISPOSABLE) ×6 IMPLANT
KIT BASIN OR (CUSTOM PROCEDURE TRAY) ×3 IMPLANT
KIT MARKER MARGIN INK (KITS) ×1 IMPLANT
KIT ROOM TURNOVER OR (KITS) ×3 IMPLANT
NDL 18GX1X1/2 (RX/OR ONLY) (NEEDLE) ×2 IMPLANT
NDL HYPO 25GX1X1/2 BEV (NEEDLE) ×4 IMPLANT
NEEDLE 18GX1X1/2 (RX/OR ONLY) (NEEDLE) ×3 IMPLANT
NEEDLE HYPO 25GX1X1/2 BEV (NEEDLE) ×6 IMPLANT
NS IRRIG 1000ML POUR BTL (IV SOLUTION) ×3 IMPLANT
PACK GENERAL/GYN (CUSTOM PROCEDURE TRAY) ×3 IMPLANT
PACK SURGICAL SETUP 50X90 (CUSTOM PROCEDURE TRAY) ×3 IMPLANT
PAD ARMBOARD 7.5X6 YLW CONV (MISCELLANEOUS) ×6 IMPLANT
PENCIL BUTTON HOLSTER BLD 10FT (ELECTRODE) ×3 IMPLANT
SPONGE GAUZE 4X4 12PLY (GAUZE/BANDAGES/DRESSINGS) ×1 IMPLANT
SPONGE LAP 18X18 X RAY DECT (DISPOSABLE) ×3 IMPLANT
STAPLER VISISTAT 35W (STAPLE) ×3 IMPLANT
STOCKINETTE IMPERVIOUS 9X36 MD (GAUZE/BANDAGES/DRESSINGS) IMPLANT
STRIP CLOSURE SKIN 1/2X4 (GAUZE/BANDAGES/DRESSINGS) ×2 IMPLANT
SUT MNCRL AB 4-0 PS2 18 (SUTURE) ×6 IMPLANT
SUT SILK 2 0 SH (SUTURE) IMPLANT
SUT VIC AB 2-0 SH 27 (SUTURE) ×6
SUT VIC AB 2-0 SH 27XBRD (SUTURE) ×4 IMPLANT
SUT VIC AB 3-0 SH 18 (SUTURE) ×3 IMPLANT
SUT VIC AB 3-0 SH 27 (SUTURE) ×6
SUT VIC AB 3-0 SH 27XBRD (SUTURE) ×4 IMPLANT
SYR CONTROL 10ML LL (SYRINGE) ×6 IMPLANT
TOWEL OR 17X24 6PK STRL BLUE (TOWEL DISPOSABLE) ×3 IMPLANT
TOWEL OR 17X26 10 PK STRL BLUE (TOWEL DISPOSABLE) ×3 IMPLANT
TUBE CONNECTING 12X1/4 (SUCTIONS) ×3 IMPLANT
YANKAUER SUCT BULB TIP NO VENT (SUCTIONS) ×3 IMPLANT

## 2011-07-26 NOTE — Transfer of Care (Signed)
Immediate Anesthesia Transfer of Care Note  Patient: Sylvia [name]  Procedure(s) Performed: Procedure(s) (LRB): BREAST LUMPECTOMY WITH NEEDLE LOCALIZATION AND AXILLARY SENTINEL LYMPH NODE BX (Left) BREAST BIOPSY WITH NEEDLE LOCALIZATION (Right)  Patient Location: PACU  Anesthesia Type: General  Level of Consciousness: awake, alert , oriented and sedated  Airway & Oxygen Therapy: Patient Spontanous Breathing and Patient connected to nasal cannula oxygen  Post-op Assessment: Report given to PACU RN, Post -op Vital signs reviewed and stable and Patient moving all extremities  Post vital signs: Reviewed  Complications: No apparent anesthesia complications

## 2011-07-26 NOTE — Anesthesia Postprocedure Evaluation (Signed)
  Anesthesia Post-op Note  Patient: Sylvia [name]  Procedure(s) Performed: Procedure(s) (LRB): BREAST LUMPECTOMY WITH NEEDLE LOCALIZATION AND AXILLARY SENTINEL LYMPH NODE BX (Left) BREAST BIOPSY WITH NEEDLE LOCALIZATION (Right)  Patient Location: PACU  Anesthesia Type: General  Level of Consciousness: awake, alert  and oriented  Airway and Oxygen Therapy: Patient Spontanous Breathing  Post-op Pain: none  Post-op Assessment: Post-op Vital signs reviewed and Patient's Cardiovascular Status Stable  Post-op Vital Signs: stable  Complications: No apparent anesthesia complications

## 2011-07-26 NOTE — Anesthesia Preprocedure Evaluation (Addendum)
Anesthesia Evaluation  Patient identified by MRN, date of birth, ID band Patient awake    Reviewed: Allergy & Precautions, H&P , Patient's Chart, lab work & pertinent test results, reviewed documented beta blocker date and time   Airway Mallampati: I TM Distance: >3 FB Neck ROM: Full    Dental  (+) Teeth Intact   Pulmonary shortness of breath and with exertion,  breath sounds clear to auscultation        Cardiovascular Rhythm:Regular Rate:Normal     Neuro/Psych    GI/Hepatic   Endo/Other  Hypothyroidism   Renal/GU      Musculoskeletal   Abdominal (+)  Abdomen: soft.    Peds  Hematology   Anesthesia Other Findings   Reproductive/Obstetrics                          Anesthesia Physical Anesthesia Plan  ASA: II  Anesthesia Plan: General   Post-op Pain Management:    Induction: Intravenous  Airway Management Planned: Oral ETT  Additional Equipment:   Intra-op Plan:   Post-operative Plan: Extubation in OR  Informed Consent: I have reviewed the patients History and Physical, chart, labs and discussed the procedure including the risks, benefits and alternatives for the proposed anesthesia with the patient or authorized representative who has indicated his/her understanding and acceptance.   Dental advisory given  Plan Discussed with:   Anesthesia Plan Comments:         Anesthesia Quick Evaluation

## 2011-07-26 NOTE — Op Note (Signed)
Preoperative diagnosis: Clinical stage I left breast cancer, right breast papilloma Postoperative diagnosis: Same as above Procedure: #1 right breast wire-guided biopsy #2 left breast wire-guided lumpectomy #3 left axillary sentinel lymph node biopsy #4 injection of blue dye for sentinel node identification  Surgeon: Dr. Harden Mo Estimated blood loss: Minimal Drains: None Complications: None Specimens: #1 right breast tissue marked with short stitch superior, long stitch lateral, double stitch in the #2 left breast tissue marked with pain kit #3 left axillary sentinel lymph nodes with counts of 3359 and 212  Sponge needle count was correct x2 at operation Disposition of patient to recovery in stable condition  Indications: This is a 66 year old female who on a screening mammogram was found to have a left breast mass. She was also noted to have a right breast mass at that same time. An MRI was performed confirming both of these lesions as well as an additional upper inner quadrant lesion of the left breast. Biopsies of all these were taken. The right breast was a papilloma and recommended for excision. The left upper outer quadrant lesion is an invasive ductal carcinoma. The upper inner quadrant lesion is fibrocystic changes that have been deemed concordant with the appearance and not recommended for excision. She now discussed all of her options and decided to proceed with a right breast excisional biopsy, left breast wire-guided lumpectomy, and left axillary sentinel lymph node biopsy we discussed the risks and benefits of breast conservation therapy including the need for radiation therapy as well as a possibility of positive margins of further surgery.  Procedure: After informed consent was obtained the patient first went to the breast center where she had wires placed in both of these lesions. There were 2 wires on the left side as one of these was short. She was then brought to the  hospital. She had 1 g of intravenous cefazolin administered. Sequential compression devices were placed on her legs. She was then placed under general anesthesia with an LMA. A surgical timeout was then performed.  I then injected a cc in each of the cardinal positions around the nipple areolar complex on the left adn this was massaged for one minute. She was then prepped and draped in the standard sterile surgical fashion.  I first approached the right side. I made a circumareolar incision and used cautery to excise the lesion as well as the wire which was brought in remotely from the incision. A Faxitron mammogram was taken confirming removal of the lesion, clip, and wire. Hemostasis was observed. The specimen was marked as above. I then closes with 3-0 Vicryl and 4-0 Monocryl. Steri-Strips and a sterile dressing were placed.  I then went to the left side. I made a radial incision in the upper outer quadrant and it excised an ellipse of skin. I then used cautery to dissect around the wires. I was able to identify the short wire and remove this at the beginning of the procedure. The other wire was brought into the incision remotely. I then used cautery to excise the wire and the surrounding tissue all the way down to and including the pectoralis fashion for the deep margin. This was marked with the paint kit. A Faxitron mammogram was taken and confirmed removal of the wire, lesion, and mass. This was confirmed by radiology. I then placed a sponge in this. I then made an incision her left axilla. Dissection was carried out through the axillary fascia. I identified 2 sentinel nodes one of which  was hot the other hot and blue. The counts are as listed as above. These were both passed off the table as specimens. There was radioactivity less than 20 upon completion. Hemostasis was observed. I then closed the axillary fascia 2-0 Vicryl. The skin was then closed with 3-0 Vicryl for Monocryl. Steri-Strips and a  dressing were placed over this as well. I then returned the breast and placed clips and all the positions around the cavity. A closer deep breast tissue with 2-0 Vicryl. I then closed the dermis with 3-0 Vicryl and the skin with 4-0 Monocryl. I then infiltrated 10 cc of quarter percent Marcaine throughout this. I then placed airstrips a sterile dressing overlying this. A breast binder was placed. She tolerated this well was extubated and transferred to recovery in stable condition.

## 2011-07-26 NOTE — H&P (View-Only) (Signed)
Subjective:     Patient ID: Sylvia [name], female   DOB: 12/30/1945, 65 y.o.   MRN: [id number]  HPI This is a 65-year-old female I saw her recently at the multidisciplinary clinic. She had a biopsy-proven left breast cancer in a right-sided intraductal papilloma at that time. An MRI showed an additional lesion in the upper inner quadrant of her left breast. This has since undergone an MRI guided biopsy which shows fibrocystic changes with usual ductal hyperplasia. This has been recommended for followup and does not need to be excised as it is it is been deemed concordant with the appearance. She returns today to discuss all of her surgical options which we have gone over previously in the multidisciplinary clinic. Additionally she has a new onset shortness of breath that she is concerned about and is sleeping on pillows that she is due to see cardiology on the 20th.  Review of Systems     Objective:   Physical Exam Deferred today     Assessment:     Left breast cancer    Plan:     We discussed pathology and options.  We discussed again lumpectomy vs mastectomy.  We discussed need for xrt after lumpectomy as well as positive margins requiring further surgery.  She would like to proceed with left breast lumpectomy, left ax snbx and right breast wire guided biopsy.  She is due to see cardiology for new onset sob and then I will schedule her.      

## 2011-07-26 NOTE — Discharge Instructions (Signed)
[facility] Office Phone Number [phone]  BREAST BIOPSY/ PARTIAL MASTECTOMY: POST OP INSTRUCTIONS  Always review your discharge instruction sheet given to you by the facility where your surgery was performed.  IF YOU HAVE DISABILITY OR FAMILY LEAVE FORMS, YOU MUST BRING THEM TO THE OFFICE FOR PROCESSING.  DO NOT GIVE THEM TO YOUR DOCTOR.  1. A prescription for pain medication may be given to you upon discharge.  Take your pain medication as prescribed, if needed.  If narcotic pain medicine is not needed, then you may take acetaminophen (Tylenol), naprosyn (Alleve) or ibuprofen (Advil) as needed. 2. Take your usually prescribed medications unless otherwise directed 3. If you need a refill on your pain medication, please contact your pharmacy.  They will contact our office to request authorization.  Prescriptions will not be filled after 5pm or on week-ends. 4. You should eat very light the first 24 hours after surgery, such as soup, crackers, pudding, etc.  Resume your normal diet the day after surgery. 5. Most patients will experience some swelling and bruising in the breast.  Ice packs and a good support bra will help.  Wear the breast binder provided or a sports bra for 72 hours day and night.  After that wear a sports bra during the day until you return to the office. Swelling and bruising can take several days to resolve.  6. It is common to experience some constipation if taking pain medication after surgery.  Increasing fluid intake and taking a stool softener will usually help or prevent this problem from occurring.  A mild laxative (Milk of Magnesia or Miralax) should be taken according to package directions if there are no bowel movements after 48 hours. 7. Unless discharge instructions indicate otherwise, you may remove your bandages 48 hours after surgery and you may shower at that time.  You may have steri-strips (small skin tapes) in place directly over the incision.   These strips should be left on the skin for 7-10 days and will come off on their own.  If your surgeon used skin glue on the incision, you may shower in 24 hours.  The glue will flake off over the next 2-3 weeks.  Any sutures or staples will be removed at the office during your follow-up visit. 8. ACTIVITIES:  You may resume regular daily activities (gradually increasing) beginning the next day.  Wearing a good support bra or sports bra minimizes pain and swelling.  You may have sexual intercourse when it is comfortable. a. You may drive when you no longer are taking prescription pain medication, you can comfortably wear a seatbelt, and you can safely maneuver your car and apply brakes. b. RETURN TO WORK:  ______________________________________________________________________________________ 9. You should see your doctor in the office for a follow-up appointment approximately two weeks after your surgery.  Your doctor's nurse will typically make your follow-up appointment when she calls you with your pathology report.  Expect your pathology report 3-4 business days after your surgery.  You may call to check if you do not hear from [place] after three days. 10. OTHER INSTRUCTIONS: _______________________________________________________________________________________________ _____________________________________________________________________________________________________________________________________ _____________________________________________________________________________________________________________________________________ _____________________________________________________________________________________________________________________________________  WHEN TO CALL DR WAKEFIELD: 1. Fever over 101.0 2. Nausea and/or vomiting. 3. Extreme swelling or bruising. 4. Continued bleeding from incision. 5. Increased pain, redness, or drainage from the incision.  The clinic staff is available to  answer your questions during regular business hours.  Please don't hesitate to call and ask to speak to one of the nurses for  clinical concerns.  If you have a medical emergency, go to the nearest emergency room or call 911.  A surgeon from [facility] is always on call at the hospital.  For further questions, please visit [email or web address]  Instructions Following General Anesthetic, Adult A nurse specialized in giving anesthesia (anesthetist) or a doctor specialized in giving anesthesia (anesthesiologist) gave you a medicine that made you sleep while a procedure was performed. For as long as 24 hours following this procedure, you may feel:  Dizzy.   Weak.   Drowsy.  AFTER THE PROCEDURE After surgery, you will be taken to the recovery area where a nurse will monitor your progress. You will be allowed to go home when you are awake, stable, taking fluids well, and without complications. For the first 24 hours following an anesthetic:  Have a responsible person with you.   Do not drive a car. If you are alone, do not take public transportation.   Do not drink alcohol.   Do not take medicine that has not been prescribed by your caregiver.   Do not sign important papers or make important decisions.   You may resume normal diet and activities as directed.   Change bandages (dressings) as directed.   Only take over-the-counter or prescription medicines for pain, discomfort, or fever as directed by your caregiver.  If you have questions or problems that seem related to the anesthetic, call the hospital and ask for the anesthetist or anesthesiologist on call. SEEK IMMEDIATE MEDICAL CARE IF:   You develop a rash.   You have difficulty breathing.   You have chest pain.   You develop any allergic problems.  Document Released: 07/22/2000 Document Revised: 04/04/2011 Document Reviewed: 03/02/2007 [facility] Patient Information 2012 [place], [place].

## 2011-07-26 NOTE — Interval H&P Note (Signed)
History and Physical Interval Note:  07/26/2011 10:08 AM  Sylvia [name]  has presented today for surgery, with the diagnosis of Left breast cancer, Right breast mass  The various methods of treatment have been discussed with the patient and family. After consideration of risks, benefits and other options for treatment, the patient has consented to  Procedure(s) (LRB): BREAST LUMPECTOMY WITH NEEDLE LOCALIZATION AND AXILLARY SENTINEL LYMPH NODE BX (Left) BREAST BIOPSY WITH NEEDLE LOCALIZATION (Right) as a surgical intervention .  The patients' history has been reviewed, patient examined, no change in status, stable for surgery.  I have reviewed the patients' chart and labs.  Questions were answered to the patient's satisfaction.     [name]

## 2011-07-26 NOTE — Preoperative (Addendum)
Beta Blockers   Reason not to administer Beta Blockers:Not Applicable 

## 2011-07-29 ENCOUNTER — Telehealth (INDEPENDENT_AMBULATORY_CARE_PROVIDER_SITE_OTHER): Payer: Self-pay | Admitting: General Surgery

## 2011-07-29 ENCOUNTER — Telehealth (INDEPENDENT_AMBULATORY_CARE_PROVIDER_SITE_OTHER): Payer: Self-pay

## 2011-07-29 MED FILL — Hydromorphone HCl Inj 1 MG/ML: INTRAMUSCULAR | Qty: 1 | Status: AC

## 2011-07-29 MED FILL — Meperidine HCl Inj 25 MG/ML: INTRAMUSCULAR | Qty: 1 | Status: AC

## 2011-07-29 NOTE — Telephone Encounter (Signed)
Pt calling c/o rash down left arm, chest wall and up neck than began the day after surgery. No swelling. No sob. No fever. Pt states she has showered and applied cortisone cream but rash has not resolved.Pt states she is allergic to benadryl. Pt has stopped pain med Saturday. I advised pt we will review this with our MD in urgent office and call her with recommendations. Pt to call if swelling, sob or difficulty swallowing occur.

## 2011-07-29 NOTE — Telephone Encounter (Signed)
Called pt to let her that Dr Magnus Ivan wanted her to do Zantac 300 mg daily and Zyrtec daily take both at the same time per Dr Magnus Ivan

## 2011-07-30 ENCOUNTER — Encounter (HOSPITAL_COMMUNITY): Payer: Self-pay | Admitting: General Surgery

## 2011-07-30 ENCOUNTER — Telehealth: Payer: Self-pay | Admitting: *Deleted

## 2011-07-30 DIAGNOSIS — D0590 Unspecified type of carcinoma in situ of unspecified breast: Secondary | ICD-10-CM | POA: Diagnosis not present

## 2011-07-30 DIAGNOSIS — D249 Benign neoplasm of unspecified breast: Secondary | ICD-10-CM | POA: Diagnosis not present

## 2011-07-30 DIAGNOSIS — E039 Hypothyroidism, unspecified: Secondary | ICD-10-CM | POA: Diagnosis not present

## 2011-07-30 DIAGNOSIS — Z01818 Encounter for other preprocedural examination: Secondary | ICD-10-CM | POA: Diagnosis not present

## 2011-07-30 DIAGNOSIS — Z01812 Encounter for preprocedural laboratory examination: Secondary | ICD-10-CM | POA: Diagnosis not present

## 2011-07-30 NOTE — Telephone Encounter (Signed)
Spoke with the patient on 07-30-2011 confirmed over the phone the new date and time on 08-12-2011

## 2011-08-01 ENCOUNTER — Telehealth (INDEPENDENT_AMBULATORY_CARE_PROVIDER_SITE_OTHER): Payer: Self-pay | Admitting: General Surgery

## 2011-08-02 ENCOUNTER — Telehealth (INDEPENDENT_AMBULATORY_CARE_PROVIDER_SITE_OTHER): Payer: Self-pay | Admitting: General Surgery

## 2011-08-02 NOTE — Telephone Encounter (Signed)
Message copied by Liliana Cline on Fri Aug 02, 2011  9:58 AM ------      Message from: Ethlyn Gallery      Created: Thu Aug 01, 2011  4:19 PM       Pt had lumpectomy on 3-29 by Dr Dwain Sarna and she is requesting her path report. I called pt to make her f/u appt and told her the path was not ready yet. The pt might call you tomorrow to check on path before the weekend. ahs

## 2011-08-02 NOTE — Telephone Encounter (Signed)
Called pathology to check on status of path report. I spoke with Dr Raynald Blend who wants to speak directly with Dr Dwain Sarna re: this. If you get a chance, please call. [phone].

## 2011-08-02 NOTE — Telephone Encounter (Signed)
She is scheduled 08/15/11. Elease Hashimoto spoke with patient and is documented in separate encounter.

## 2011-08-02 NOTE — Telephone Encounter (Signed)
Patient aware pathology was contacted this morning, written in a previous phone note, and we will call when ready. Pathology has still not been signed out.

## 2011-08-05 ENCOUNTER — Telehealth (INDEPENDENT_AMBULATORY_CARE_PROVIDER_SITE_OTHER): Payer: Self-pay | Admitting: General Surgery

## 2011-08-05 NOTE — Telephone Encounter (Signed)
Dr Dwain Sarna calling patient now.

## 2011-08-12 ENCOUNTER — Ambulatory Visit: Payer: Medicare Other | Admitting: Oncology

## 2011-08-12 ENCOUNTER — Other Ambulatory Visit: Payer: Medicare Other | Admitting: Lab

## 2011-08-15 ENCOUNTER — Other Ambulatory Visit: Payer: Medicare Other | Admitting: Lab

## 2011-08-15 ENCOUNTER — Encounter (INDEPENDENT_AMBULATORY_CARE_PROVIDER_SITE_OTHER): Payer: Self-pay | Admitting: General Surgery

## 2011-08-15 ENCOUNTER — Ambulatory Visit (INDEPENDENT_AMBULATORY_CARE_PROVIDER_SITE_OTHER): Payer: Medicare Other | Admitting: General Surgery

## 2011-08-15 ENCOUNTER — Ambulatory Visit: Payer: Medicare Other | Admitting: Oncology

## 2011-08-15 VITALS — BP 136/78 | HR 64 | Temp 97.4°F | Resp 16 | Ht 62.0 in | Wt 151.4 lb

## 2011-08-15 DIAGNOSIS — Z09 Encounter for follow-up examination after completed treatment for conditions other than malignant neoplasm: Secondary | ICD-10-CM

## 2011-08-15 NOTE — Progress Notes (Signed)
Subjective:     Patient ID: Sylvia [name], female   DOB: 07/18/1945, 66 y.o.   MRN: [id number]  HPI This is a 66 year old female who was diagnosed with invasive breast cancer on the left side at her initial diagnosis. She underwent a thorough evaluation. She ended up undergoing a right breast lumpectomy for a papilloma. 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 She has a stage 0 breast cancer. She and I discussed this today. She reports some pain at her surgical sites. She reports some tingling of her nipple as well as some warmth. She reports an burning on the inside of her arm as well. Review of Systems     Objective:   Physical Exam  Pulmonary/Chest:         Assessment:     S/p breast biopsy on right for papilloma and lump/sn for left breast stage 0 cancer    Plan:     We discussed the change in her pathology from preop. 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 She is done with surgery. I told her that most of what she is feeling his normal postoperatively and should improve with time. I will see er back in about 3 months just to ensure that she continues to heal. She will see radiation and medical oncology.

## 2011-08-21 ENCOUNTER — Telehealth (INDEPENDENT_AMBULATORY_CARE_PROVIDER_SITE_OTHER): Payer: Self-pay | Admitting: General Surgery

## 2011-08-21 DIAGNOSIS — C50912 Malignant neoplasm of unspecified site of left female breast: Secondary | ICD-10-CM

## 2011-08-21 NOTE — Telephone Encounter (Signed)
Reinaldo Berber, RN, with Dr. Chipper Herb at the [facility], calling for referral needed for this patient.  Was told by Dr. Dwain Sarna at [facility] that she was ready for radiation therapy and pt is calling her to get appt.  Please send referral through regular channels and "touch base" with Lelon Mast [phone].)

## 2011-08-21 NOTE — Telephone Encounter (Signed)
Returned Sylvia [name]'s call back to let her know that I did route the medical and radiation referral to [facility] w/ [facility] now. I will notify the pt that the referral is in process.

## 2011-08-23 ENCOUNTER — Telehealth: Payer: Self-pay | Admitting: Oncology

## 2011-08-23 ENCOUNTER — Telehealth: Payer: Self-pay | Admitting: Radiation Oncology

## 2011-08-23 NOTE — Telephone Encounter (Signed)
I left a message to schedule a follow up appt for the pt to see Dr. Donnie Coffin.

## 2011-08-23 NOTE — Telephone Encounter (Signed)
08/22/2011 1137 Patient phoned concerned that no one from Dr. Rennie Plowman office had contact her with an appointment. Also, patient reported that Dr. Dwain Sarna cleared her to begin radiation. Patient questioned if pathology had been obtained. Confirmed pathology had been obtained. Verbalized this findings to patient. Phoned Helmut Muster, Dr. Doreen Salvage nurse, who confirmed she had referred the patient back to the breast clinic. Patient scheduled to see Dr. Donnie Coffin 08/23/11 at 1100. Called the patient and verbalized these findings to her. Routed this message to Dr. Dayton Scrape.

## 2011-08-26 ENCOUNTER — Telehealth: Payer: Self-pay | Admitting: *Deleted

## 2011-08-26 ENCOUNTER — Ambulatory Visit (HOSPITAL_BASED_OUTPATIENT_CLINIC_OR_DEPARTMENT_OTHER): Payer: Medicare Other | Admitting: Oncology

## 2011-08-26 VITALS — BP 122/75 | HR 80 | Temp 97.9°F | Ht 62.0 in | Wt 154.0 lb

## 2011-08-26 DIAGNOSIS — C50419 Malignant neoplasm of upper-outer quadrant of unspecified female breast: Secondary | ICD-10-CM | POA: Diagnosis not present

## 2011-08-26 DIAGNOSIS — E559 Vitamin D deficiency, unspecified: Secondary | ICD-10-CM | POA: Diagnosis not present

## 2011-08-26 MED ORDER — TAMOXIFEN CITRATE 20 MG PO TABS: 20.0000 mg | ORAL_TABLET | Freq: Every day | ORAL | Status: DC

## 2011-08-26 NOTE — Telephone Encounter (Signed)
patient had bone density at [facility] on 07-29-2011

## 2011-08-26 NOTE — Patient Instructions (Signed)
Please take additional 2000 units of vitamin D3 per day.  We will start tamoxifen 20 mg/ d  For hot flashes I would recommend : Taking vitamin e 400 u/ 2 x per day peridin c 2 pills ~ 3 times per day

## 2011-08-26 NOTE — Progress Notes (Signed)
Hematology and Oncology Follow Up Visit  Sylvia [name] [id number] 1945/06/19 66 y.o. 08/26/2011 2:06 PM   DIAGNOSIS: 66 year old woman with history of ductal cancer left breast, status post recent bilateral  lumpectomy 07/26/2011.   Encounter Diagnoses  Name Primary?  . Cancer of upper-outer quadrant of female breast Yes  . Unspecified vitamin D deficiency      PAST THERAPY: As above   Interim History:  Patient returns for followup. She had her lumpectomy is as noted above. Pathology showed intraductal papilloma with biopsy change on the right side. On the left side there was low-grade ductal carcinoma in situ spanning -0.1 cm. The previously noted lesion felt to be a invasive cancer was now felt to be a myoepithelial node measured 1.5 cm. 2 lymph nodes were identified and negative for malignancy. The case has been presented at our tumor conference. It was felt that she did not require radiation therapy. It was felt that she would benefit from adjuvant hormonal therapy.  Medications: I have reviewed the patient's current medications.  Allergies:  Allergies  Allergen Reactions  . Benadryl (Diphenhydramine Hcl) Other (See Comments)    "Super hyper"  . Tape     Skin breakdown/redness   . Boniva (Ibandronate Sodium) Other (See Comments)    Neck, shoulder limited mobility   . Chlorhexidine Gluconate   . Codeine Other (See Comments)    "can't wake up from it."    Past Medical History, Surgical history, Social history, and Family History were reviewed and updated.  Review of Systems: Constitutional:  Negative for fever, chills, night sweats, anorexia, weight loss, pain. Cardiovascular: no chest pain or dyspnea on exertion Respiratory: negative Neurological: negative Dermatological: negative ENT: negative Skin Gastrointestinal: negative Genito-Urinary: negative Hematological and Lymphatic: negative Breast: negative Musculoskeletal: negative Remaining ROS  negative.  Physical Exam:  Blood pressure 122/75, pulse 80, temperature 97.9 F (36.6 C), temperature source Oral, height 5\' 2"  (1.575 m), weight 154 lb (69.854 kg).  ECOG: 0  General appearance: alert, cooperative and appears stated age Eyes: conjunctivae/corneas clear. PERRL, EOM's intact. Fundi benign. Throat: lips, mucosa, and tongue normal; teeth and gums normal Resp: clear to auscultation bilaterally and normal percussion bilaterally Breasts: normal appearance, no masses or tenderness Cardio: regular rate and rhythm, S1, S2 normal, no murmur, click, rub or gallop   Lab Results: Lab Results  Component Value Date   WBC 4.1 07/23/2011   HGB 13.5 07/23/2011   HCT 39.1 07/23/2011   MCV 90.1 07/23/2011   PLT 209 07/23/2011     Chemistry      Component Value Date/Time   NA 140 07/23/2011 1219   K 4.0 07/23/2011 1219   CL 105 07/23/2011 1219   CO2 27 07/23/2011 1219   BUN 11 07/23/2011 1219   CREATININE 0.66 07/23/2011 1219      Component Value Date/Time   CALCIUM 9.1 07/23/2011 1219   ALKPHOS 60 07/03/2011 1155   AST 14 07/03/2011 1155   ALT 9 07/03/2011 1155   BILITOT 0.4 07/03/2011 1155       Radiological Studies:  No results found.   IMPRESSIONS AND PLAN: A 66 y.o. female with history of 2 lesions in either breast both of which turned out to be benign lesions a slightly tiny focus of DCIS present in the left lumpectomy specimen. We will get confirmation that this is ER and PR positive. I did bring up the option of tamoxifen therapy with the patient. I did explain side effects. She is having  problems with hot flashes and discussed various maneuvers to help control that. I did explain risk-benefit ratio of taking tamoxifen in this setting. She has agreed take tamoxifen. I will see her in 3 months time for followup.      Spent more than half the time coordinating care.    Divine Hansley 4/29/20132:06 PM

## 2011-08-29 ENCOUNTER — Ambulatory Visit: Payer: Medicare Other | Admitting: Oncology

## 2011-08-29 ENCOUNTER — Other Ambulatory Visit: Payer: Medicare Other | Admitting: Lab

## 2011-10-15 DIAGNOSIS — H52209 Unspecified astigmatism, unspecified eye: Secondary | ICD-10-CM | POA: Diagnosis not present

## 2011-10-15 DIAGNOSIS — H524 Presbyopia: Secondary | ICD-10-CM | POA: Diagnosis not present

## 2011-10-15 DIAGNOSIS — H35379 Puckering of macula, unspecified eye: Secondary | ICD-10-CM | POA: Diagnosis not present

## 2011-10-28 ENCOUNTER — Ambulatory Visit
Admission: RE | Admit: 2011-10-28 | Discharge: 2011-10-28 | Disposition: A | Discharge status: Home / Self Care | Payer: Medicare Other | Source: Ambulatory Visit | Attending: Internal Medicine | Admitting: Internal Medicine

## 2011-10-28 ENCOUNTER — Other Ambulatory Visit: Payer: Self-pay | Admitting: Internal Medicine

## 2011-10-28 DIAGNOSIS — E785 Hyperlipidemia, unspecified: Secondary | ICD-10-CM | POA: Diagnosis not present

## 2011-10-28 DIAGNOSIS — M25552 Pain in left hip: Secondary | ICD-10-CM

## 2011-10-28 DIAGNOSIS — M949 Disorder of cartilage, unspecified: Secondary | ICD-10-CM | POA: Diagnosis not present

## 2011-10-28 DIAGNOSIS — M25559 Pain in unspecified hip: Secondary | ICD-10-CM | POA: Diagnosis not present

## 2011-10-28 DIAGNOSIS — C50919 Malignant neoplasm of unspecified site of unspecified female breast: Secondary | ICD-10-CM | POA: Diagnosis not present

## 2011-10-28 DIAGNOSIS — M899 Disorder of bone, unspecified: Secondary | ICD-10-CM | POA: Diagnosis not present

## 2011-10-28 DIAGNOSIS — M2550 Pain in unspecified joint: Secondary | ICD-10-CM | POA: Diagnosis not present

## 2011-10-28 DIAGNOSIS — E039 Hypothyroidism, unspecified: Secondary | ICD-10-CM | POA: Diagnosis not present

## 2011-11-18 ENCOUNTER — Encounter (INDEPENDENT_AMBULATORY_CARE_PROVIDER_SITE_OTHER): Payer: Medicare Other | Admitting: General Surgery

## 2011-11-22 ENCOUNTER — Ambulatory Visit (INDEPENDENT_AMBULATORY_CARE_PROVIDER_SITE_OTHER): Payer: Medicare Other | Admitting: General Surgery

## 2011-11-22 ENCOUNTER — Encounter (INDEPENDENT_AMBULATORY_CARE_PROVIDER_SITE_OTHER): Payer: Self-pay | Admitting: General Surgery

## 2011-11-22 VITALS — BP 140/82 | HR 72 | Temp 97.8°F | Resp 16 | Ht 62.0 in | Wt 153.4 lb

## 2011-11-22 DIAGNOSIS — Z853 Personal history of malignant neoplasm of breast: Secondary | ICD-10-CM | POA: Diagnosis not present

## 2011-11-22 NOTE — Progress Notes (Signed)
Subjective:     Patient ID: Sylvia [name], female   DOB: 1945/07/25, 66 y.o.   MRN: [id number]  HPI Emelia Loron, MD 08/15/2011 11:21 AM Signed  Subjective:   Patient ID: Sylvia [name], female DOB: 15-Jun-1945, 66 y.o. MRN: [id number]  HPI  This is a 66 year old female who was diagnosed with invasive breast cancer on the left side at her initial diagnosis. She underwent a thorough evaluation. She ended up undergoing a right breast lumpectomy for a papilloma. 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 She has since been seen by medical oncology and to go on tamoxifen. She comes back in today just for a check after her surgery again. She reports some numbness at her axillary incision but is otherwise doing well without any complaints. She has been having some significant hot flashes from her tamoxifen.   Review of Systems     Objective:   Physical Exam  Pulmonary/Chest: Right breast exhibits no inverted nipple, no mass, no nipple discharge, no skin change and no tenderness. Left breast exhibits no inverted nipple, no mass, no nipple discharge, no skin change and no tenderness.    Lymphadenopathy:    She has no cervical adenopathy.    She has no axillary adenopathy.       Right: No supraclavicular adenopathy present.       Left: No supraclavicular adenopathy present.       Assessment:     History stage 0 breast cancer status post lumpectomy, sentinel nodee biopsy, and tamoxifen now    Plan:     She is doing well and she is in a continuous selectivity. She's going to continue her on exams monthly. I told her I would happy to see her annually for examining conjunction with medical oncology. She has no clinical evidence of recurrence at this point. I also encouraged her to discuss with medical oncology the hot flashes and she is starting to think about not taking the tamoxifen right now.  She's going to follow up with him.

## 2011-11-22 NOTE — Patient Instructions (Signed)

## 2011-11-27 ENCOUNTER — Ambulatory Visit (HOSPITAL_BASED_OUTPATIENT_CLINIC_OR_DEPARTMENT_OTHER): Payer: Medicare Other | Admitting: Oncology

## 2011-11-27 ENCOUNTER — Telehealth: Payer: Self-pay | Admitting: *Deleted

## 2011-11-27 ENCOUNTER — Other Ambulatory Visit (HOSPITAL_BASED_OUTPATIENT_CLINIC_OR_DEPARTMENT_OTHER): Payer: Medicare Other

## 2011-11-27 VITALS — BP 117/76 | HR 62 | Temp 98.8°F | Ht 62.0 in | Wt 154.4 lb

## 2011-11-27 DIAGNOSIS — C50419 Malignant neoplasm of upper-outer quadrant of unspecified female breast: Secondary | ICD-10-CM

## 2011-11-27 DIAGNOSIS — M949 Disorder of cartilage, unspecified: Secondary | ICD-10-CM | POA: Diagnosis not present

## 2011-11-27 DIAGNOSIS — D0590 Unspecified type of carcinoma in situ of unspecified breast: Secondary | ICD-10-CM

## 2011-11-27 DIAGNOSIS — E559 Vitamin D deficiency, unspecified: Secondary | ICD-10-CM

## 2011-11-27 DIAGNOSIS — M899 Disorder of bone, unspecified: Secondary | ICD-10-CM

## 2011-11-27 LAB — CBC WITH DIFFERENTIAL/PLATELET
BASO%: 0.8 % (ref 0.0–2.0)
EOS%: 0.9 % (ref 0.0–7.0)
MCH: 31.2 pg (ref 25.1–34.0)
MCHC: 33.7 g/dL (ref 31.5–36.0)
NEUT%: 54.3 % (ref 38.4–76.8)
RBC: 4.12 10*6/uL (ref 3.70–5.45)
RDW: 13.8 % (ref 11.2–14.5)
lymph#: 1.5 10*3/uL (ref 0.9–3.3)

## 2011-11-27 NOTE — Progress Notes (Signed)
Hematology and Oncology Follow Up Visit  Sylvia [name] [id number] 1946-01-12 66 y.o. 11/27/2011 1:34 PM   DIAGNOSIS: 66 year old woman with history of ductal cancer left breast, status post recent bilateral  lumpectomy 07/26/2011, current tamoxifen therapy. No diagnosis found.   PAST THERAPY: As above   Interim History:  Patient returns for followup. She is doing fairly well on tamoxifen. Her major issues hot flashes. She did get the peridin c  and is taking 2 pills 3 times a day which seems to be helping quite a bit. She is able to sleep through the night. She is taking additional vitamin D and vitamin E as well. Review of her most recent bone density results from March which showed fairly stable osteopenic bone changes.  Medications: I have reviewed the patient's current medications.  Allergies:  Allergies  Allergen Reactions  . Benadryl (Diphenhydramine Hcl) Other (See Comments)    "Super hyper"  . Tape     Skin breakdown/redness   . Boniva (Ibandronate Sodium) Other (See Comments)    Neck, shoulder limited mobility   . Chlorhexidine Gluconate   . Codeine Other (See Comments)    "can't wake up from it."    Past Medical History, Surgical history, Social history, and Family History were reviewed and updated.  Review of Systems: Constitutional:  Negative for fever, chills, night sweats, anorexia, weight loss, pain. Cardiovascular: no chest pain or dyspnea on exertion Respiratory: negative Neurological: negative Dermatological: negative ENT: negative Skin Gastrointestinal: negative Genito-Urinary: negative Hematological and Lymphatic: negative Breast: negative Musculoskeletal: negative Remaining ROS negative.  Physical Exam:  Blood pressure 117/76, pulse 62, temperature 98.8 F (37.1 C), height 5\' 2"  (1.575 m), weight 154 lb 6.4 oz (70.035 kg).  ECOG: 0  General appearance: alert, cooperative and appears stated age Eyes: conjunctivae/corneas clear. PERRL,  EOM's intact. Fundi benign. Throat: lips, mucosa, and tongue normal; teeth and gums normal Resp: clear to auscultation bilaterally and normal percussion bilaterally Breasts: normal appearance, no masses or tenderness Cardio: regular rate and rhythm, S1, S2 normal, no murmur, click, rub or gallop   Lab Results: Lab Results  Component Value Date   WBC 4.1 11/27/2011   HGB 12.9 11/27/2011   HCT 38.2 11/27/2011   MCV 92.7 11/27/2011   PLT 194 11/27/2011     Chemistry      Component Value Date/Time   NA 140 07/23/2011 1219   K 4.0 07/23/2011 1219   CL 105 07/23/2011 1219   CO2 27 07/23/2011 1219   BUN 11 07/23/2011 1219   CREATININE 0.66 07/23/2011 1219      Component Value Date/Time   CALCIUM 9.1 07/23/2011 1219   ALKPHOS 60 07/03/2011 1155   AST 14 07/03/2011 1155   ALT 9 07/03/2011 1155   BILITOT 0.4 07/03/2011 1155       Radiological Studies:  No results found.   IMPRESSIONS AND PLAN: A 66 y.o. female with history of 2 lesions in either breast both of which turned out to be benign lesions a slightly tiny focus of DCIS present in the left lumpectomy specimen. We will get confirmation that this is ER and PR positive. I did bring up the option of tamoxifen therapy with the patient. Given her hot flashes are improved she is tolerating tamoxifen fairly well. She has had a baseline eye exam. I will plan serial followup in 6 months.    Spent more than half the time coordinating care.    Merrill Deanda 7/31/20131:34 PM

## 2011-11-27 NOTE — Patient Instructions (Addendum)
pls call if hot flashes continue to be a problem

## 2011-11-27 NOTE — Telephone Encounter (Signed)
06-26-2012 mailed out calendar to inform the patient of the new date and time

## 2011-11-28 ENCOUNTER — Encounter: Payer: Self-pay | Admitting: *Deleted

## 2011-11-28 LAB — COMPREHENSIVE METABOLIC PANEL
ALT: 9 U/L (ref 0–35)
AST: 13 U/L (ref 0–37)
Calcium: 8.7 mg/dL (ref 8.4–10.5)
Chloride: 108 mEq/L (ref 96–112)
Creatinine, Ser: 0.79 mg/dL (ref 0.50–1.10)
Potassium: 4.8 mEq/L (ref 3.5–5.3)

## 2011-11-28 NOTE — Progress Notes (Signed)
Mailed after appt letter to pt. 

## 2011-12-23 DIAGNOSIS — H35369 Drusen (degenerative) of macula, unspecified eye: Secondary | ICD-10-CM | POA: Diagnosis not present

## 2012-02-27 DIAGNOSIS — R197 Diarrhea, unspecified: Secondary | ICD-10-CM | POA: Diagnosis not present

## 2012-02-27 DIAGNOSIS — N951 Menopausal and female climacteric states: Secondary | ICD-10-CM | POA: Diagnosis not present

## 2012-03-02 DIAGNOSIS — R198 Other specified symptoms and signs involving the digestive system and abdomen: Secondary | ICD-10-CM | POA: Diagnosis not present

## 2012-03-02 DIAGNOSIS — Z800 Family history of malignant neoplasm of digestive organs: Secondary | ICD-10-CM | POA: Diagnosis not present

## 2012-03-10 ENCOUNTER — Encounter: Payer: Self-pay | Admitting: Obstetrics and Gynecology

## 2012-03-10 ENCOUNTER — Ambulatory Visit (INDEPENDENT_AMBULATORY_CARE_PROVIDER_SITE_OTHER): Payer: Medicare Other | Admitting: Obstetrics and Gynecology

## 2012-03-10 VITALS — BP 100/62 | Resp 14 | Ht 62.0 in | Wt 150.0 lb

## 2012-03-10 DIAGNOSIS — Z124 Encounter for screening for malignant neoplasm of cervix: Secondary | ICD-10-CM

## 2012-03-10 DIAGNOSIS — Z01419 Encounter for gynecological examination (general) (routine) without abnormal findings: Secondary | ICD-10-CM

## 2012-03-10 DIAGNOSIS — C50919 Malignant neoplasm of unspecified site of unspecified female breast: Secondary | ICD-10-CM | POA: Diagnosis not present

## 2012-03-10 NOTE — Progress Notes (Signed)
Subjective:    Sylvia [name] is a 66 y.o. female G1P1 who presents for annual exam. The patient complaints of recent diagnosis of breast cancer. She has hot flashes associated with her tamoxifen.  She discontinued her medication for her osteoporosis because of side effects.  She is currently not sexually active and she did not use Vagifem last year.  The following portions of the patient's history were reviewed and updated as appropriate: allergies, current medications, past family history, past medical history, past social history, past surgical history and problem list.  Review of Systems Pertinent items are noted in HPI. Gastrointestinal:No change in bowel habits, no abdominal pain, no rectal bleeding Genitourinary:negative for dysuria, frequency, hematuria, nocturia and urinary incontinence    Objective:     BP 100/62  Resp 14  Ht 5\' 2"  (1.575 m)  Wt 150 lb (68.04 kg)  BMI 27.44 kg/m2  Weight:  Wt Readings from Last 1 Encounters:  03/10/12 150 lb (68.04 kg)     BMI: Body mass index is 27.44 kg/(m^2). General Appearance: Alert, appropriate appearance for age. No acute distress HEENT: Grossly normal Neck / Thyroid: Supple, no masses, nodes or enlargement Lungs: clear to auscultation bilaterally Back: No CVA tenderness Breast Exam: healed scars on the left from her recent surgery, no masses appreciated Cardiovascular: Regular rate and rhythm. S1, S2, no murmur Gastrointestinal: Soft, non-tender, no masses or organomegaly  ++++++++++++++++++++++++++++++++++++++++++++++++++++++++  Pelvic Exam: External genitalia: normal general appearance Vaginal: normal without tenderness, induration or masses and atrophic mucosa Cervix: normal appearance Adnexa: normal bimanual exam Uterus: small, nontender Rectovaginal: normal rectal, no masses  ++++++++++++++++++++++++++++++++++++++++++++++++++++++++  Lymphatic Exam: Non-palpable nodes in neck, clavicular, axillary, or inguinal  regions  Psychiatric: Alert and oriented, appropriate affect.      Assessment:    Normal gyn exam Menopause breast cancer  osteoporosis  Overweight or obese: No  Pelvic relaxation: Yes  Menopausal symptoms: Yes. Severe: Yes.   Plan:    Mammogram. Pap smear.   Follow-up:  for annual exam  The updated Pap smear screening guidelines were discussed with the patient. The patient requested that I obtain a Pap smear: Yes.  Kegel exercises discussed: Yes.  Proper diet and regular exercise were reviewed.  Annual mammograms recommended starting at age 35. Proper breast care was discussed.  Screening colonoscopy is recommended beginning at age 46.  Regular health maintenance was reviewed.  Sleep hygiene was discussed.  Adequate calcium and vitamin D intake was emphasized.  Leonard Schwartz M.D.   Regular Periods: no Mammogram: yes  Monthly Breast Ex.: no Exercise: yes  Tetanus < 10 years: no Seatbelts: yes  NI. Bladder Functn.: yes Abuse at home: no  Daily BM's: yes issues back in 01-29-12 Stressful Work: no  Healthy Diet: yes Sigmoid-Colonoscopy: scheduled 03/23/2012  Calcium: no Medical problems this year:  Breast Cancer.   LAST PAP:10/15/2010 "WNL"  Contraception: None   Mammogram:  07/2011   PCP: Dr.Tiffany Reed   PMH: Breast Cancer   FMH: No Changes  Last Bone Scan: 06/2011

## 2012-03-11 LAB — PAP IG W/ RFLX HPV ASCU

## 2012-03-23 ENCOUNTER — Other Ambulatory Visit: Payer: Self-pay | Admitting: Gastroenterology

## 2012-03-23 DIAGNOSIS — K620 Anal polyp: Secondary | ICD-10-CM | POA: Diagnosis not present

## 2012-03-23 DIAGNOSIS — K648 Other hemorrhoids: Secondary | ICD-10-CM | POA: Diagnosis not present

## 2012-03-23 DIAGNOSIS — Z800 Family history of malignant neoplasm of digestive organs: Secondary | ICD-10-CM | POA: Diagnosis not present

## 2012-03-23 DIAGNOSIS — D128 Benign neoplasm of rectum: Secondary | ICD-10-CM | POA: Diagnosis not present

## 2012-03-23 DIAGNOSIS — K621 Rectal polyp: Secondary | ICD-10-CM | POA: Diagnosis not present

## 2012-03-23 DIAGNOSIS — Z1211 Encounter for screening for malignant neoplasm of colon: Secondary | ICD-10-CM | POA: Diagnosis not present

## 2012-03-23 DIAGNOSIS — D129 Benign neoplasm of anus and anal canal: Secondary | ICD-10-CM | POA: Diagnosis not present

## 2012-03-23 DIAGNOSIS — K5730 Diverticulosis of large intestine without perforation or abscess without bleeding: Secondary | ICD-10-CM | POA: Diagnosis not present

## 2012-03-23 DIAGNOSIS — D126 Benign neoplasm of colon, unspecified: Secondary | ICD-10-CM | POA: Diagnosis not present

## 2012-03-23 LAB — HM COLONOSCOPY

## 2012-03-24 ENCOUNTER — Other Ambulatory Visit: Payer: Self-pay | Admitting: Oncology

## 2012-03-24 DIAGNOSIS — C50419 Malignant neoplasm of upper-outer quadrant of unspecified female breast: Secondary | ICD-10-CM

## 2012-03-30 DIAGNOSIS — C50919 Malignant neoplasm of unspecified site of unspecified female breast: Secondary | ICD-10-CM | POA: Diagnosis not present

## 2012-03-30 DIAGNOSIS — E039 Hypothyroidism, unspecified: Secondary | ICD-10-CM | POA: Diagnosis not present

## 2012-03-30 DIAGNOSIS — M949 Disorder of cartilage, unspecified: Secondary | ICD-10-CM | POA: Diagnosis not present

## 2012-03-30 DIAGNOSIS — Z23 Encounter for immunization: Secondary | ICD-10-CM | POA: Diagnosis not present

## 2012-03-30 DIAGNOSIS — M899 Disorder of bone, unspecified: Secondary | ICD-10-CM | POA: Diagnosis not present

## 2012-04-06 ENCOUNTER — Other Ambulatory Visit: Payer: Self-pay | Admitting: *Deleted

## 2012-04-06 DIAGNOSIS — C50919 Malignant neoplasm of unspecified site of unspecified female breast: Secondary | ICD-10-CM

## 2012-04-06 MED ORDER — VENLAFAXINE HCL ER 37.5 MG PO CP24: 37.5000 mg | ORAL_CAPSULE | Freq: Every day | ORAL | Status: DC

## 2012-04-06 NOTE — Telephone Encounter (Signed)
Received call from patient stating she has been taking the peridin - C and is not working and has given her diarrhea.  Patient is requesting something else for hot flashes.  Per Dr. Donnie Coffin patient can take Effexor XR.  Rx called into her pharmacy.

## 2012-04-29 LAB — HM PAP SMEAR: HM Pap smear: NORMAL

## 2012-05-29 ENCOUNTER — Telehealth: Payer: Self-pay | Admitting: *Deleted

## 2012-05-29 NOTE — Telephone Encounter (Signed)
Called and spoke with patient to reschedule her appt. Confirmed appt. For 06/24/12 at 115 with Amy Berry,PA.  Patient requests Dr. Darnelle Catalan.

## 2012-05-30 ENCOUNTER — Encounter: Payer: Self-pay | Admitting: Oncology

## 2012-06-04 ENCOUNTER — Telehealth: Payer: Self-pay | Admitting: *Deleted

## 2012-06-04 NOTE — Telephone Encounter (Signed)
Called and spoke with patient to reschedule her appt per provider.  Confirmed appt for 06/24/12 at 11am with Melissa Cross,NP.

## 2012-06-08 NOTE — Telephone Encounter (Signed)
Error

## 2012-06-24 ENCOUNTER — Telehealth: Payer: Self-pay | Admitting: Oncology

## 2012-06-24 ENCOUNTER — Ambulatory Visit (HOSPITAL_BASED_OUTPATIENT_CLINIC_OR_DEPARTMENT_OTHER): Payer: Medicare Other | Admitting: Lab

## 2012-06-24 ENCOUNTER — Ambulatory Visit (HOSPITAL_BASED_OUTPATIENT_CLINIC_OR_DEPARTMENT_OTHER): Payer: Medicare Other | Admitting: Gynecologic Oncology

## 2012-06-24 ENCOUNTER — Encounter: Payer: Self-pay | Admitting: Gynecologic Oncology

## 2012-06-24 ENCOUNTER — Ambulatory Visit: Payer: Medicare Other | Admitting: Physician Assistant

## 2012-06-24 VITALS — BP 118/74 | HR 73 | Temp 98.3°F | Resp 20 | Ht 62.0 in | Wt 148.5 lb

## 2012-06-24 DIAGNOSIS — D249 Benign neoplasm of unspecified breast: Secondary | ICD-10-CM | POA: Diagnosis not present

## 2012-06-24 DIAGNOSIS — C50912 Malignant neoplasm of unspecified site of left female breast: Secondary | ICD-10-CM

## 2012-06-24 DIAGNOSIS — D0590 Unspecified type of carcinoma in situ of unspecified breast: Secondary | ICD-10-CM | POA: Diagnosis not present

## 2012-06-24 LAB — COMPREHENSIVE METABOLIC PANEL (CC13)
AST: 20 U/L (ref 5–34)
Albumin: 3.7 g/dL (ref 3.5–5.0)
CO2: 26 mEq/L (ref 22–29)
Calcium: 8.6 mg/dL (ref 8.4–10.4)
Potassium: 4.3 mEq/L (ref 3.5–5.1)
Sodium: 141 mEq/L (ref 136–145)
Total Bilirubin: 0.45 mg/dL (ref 0.20–1.20)
Total Protein: 7 g/dL (ref 6.4–8.3)

## 2012-06-24 LAB — CBC WITH DIFFERENTIAL/PLATELET
Eosinophils Absolute: 0.1 10*3/uL (ref 0.0–0.5)
LYMPH%: 33.4 % (ref 14.0–49.7)
MONO#: 0.3 10*3/uL (ref 0.1–0.9)
NEUT#: 2.6 10*3/uL (ref 1.5–6.5)
Platelets: 186 10*3/uL (ref 145–400)
RBC: 4.12 10*6/uL (ref 3.70–5.45)
RDW: 13.1 % (ref 11.2–14.5)
WBC: 4.5 10*3/uL (ref 3.9–10.3)

## 2012-06-24 NOTE — Patient Instructions (Addendum)
Doing well.  Continue taking Tamoxifen as prescribed.  Begin taking Caltrate plus D twice daily.  We will contact you with your lab work from today.  Plan to have your mammogram in April of 2014 and April of 2015 and follow up with Dr.  Darnelle Catalan in May of 2015 or sooner if needed.

## 2012-06-24 NOTE — Progress Notes (Signed)
ID: Sylvia [name]   DOB: 19-Aug-1945  MR#: [id number]  WUJ#:[id number]  PCP: Bufford Spikes, DO GYN: Dr. Brandy Hale SU: Dr. Dwain Sarna OTHER MD:  HISTORY OF PRESENT ILLNESS:  Sylvia [name] is a 67 year old [place] woman, who had a screening mammogram on 06/19/2011, which showed a 1 cm mass lateral aspect of the left breast. Ultrasound confirmed the presence of a 1.3 x 1 x 0.8 cm lobulated well-circumscribed mass at 3:00 4 cm from the nipple. 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 HER-2 was negative the ratio of 1.03. Bilateral MRI scan performed 07/01/2011 showed bilateral multinodular parenchymal enhancement with a 2 x 1.5 x 1.5 cm irregular enhancing mass left breast at the 2:00 position with corresponding to the recently biopsied mass. Initial enhancing mass was seen in the upper-outer quadrant of the left breast with a second look ultrasound recommended. In addition, in the right breast a 1.3 cm area of nodular clumped linear enhancement was located in the superior subareolar portion of the right breast. A subsequent biopsy of the right breast on 07/02/11 showed an intraductal papilloma with microcalcifications.  On 07/26/11, she underwent a left and right lumpectomy with left axillary sentinel lymph node biopsy by Dr. Dwain Sarna.  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  Two examined lymph nodes were negative.  She was started on Tamoxifen in April of 2013.  INTERVAL HISTORY:  She presents today for continued follow up.  She feels that she tolerates Tamoxifen well.  She reports that she had been having hot flashes since she was 44 but adding Tamoxifen worsened the problem.  She reports having 7 to 10 hot flashes a day.  She has been using a cooling pillow at night time that assists with symptom  relief.  She tried taking Peridin C, which caused severe diarrhea.  She also took Effexor once and "got really sick and did not take it anymore."  She reports that she did not receive radiation therapy after surgery because "Dr. Dayton Scrape did not feel that it was warranted."      REVIEW OF SYSTEMS: Constitutional: Feels well.  Cardiovascular: No chest pain, shortness of breath, or edema.  Pulmonary: No cough or wheeze.  Gastrointestinal: No nausea, vomiting, or diarrhea. No bright red blood per rectum or change in bowel movement.  Genitourinary: No frequency, urgency, or dysuria. No vaginal bleeding or discharge.  Musculoskeletal: No myalgia or joint pain. Neurologic: No weakness, numbness, or change in gait.  Psychology: No depression, anxiety, or insomnia.  PAST MEDICAL HISTORY: Past Medical History  Diagnosis Date  . Hypothyroid   . S/P bilateral breast biopsy 2013  . Complication of anesthesia     slow to wake up  . Normal nuclear stress test     [facility], 07/18/2011  . Shortness of breath     /w housework  . Reflux esophagitis     pt. knowledgeable about dietary intake relative to reflux  . Cancer     left breast cancer  . Arthritis     OA- fingers, toes, back pain, hip pain     PAST SURGICAL HISTORY: Past Surgical History  Procedure Laterality Date  . Cataract extraction  2012  . Dilation and curettage of uterus  1978  . Eye surgery      R cat. extracted /w IOL  . Tonsillectomy  as a child  . Ectopic pregnancy surgery      1980  . Breast surgery      R- 1996, benign biopsy,  Dr. Johna Sheriff  . Breast biopsy  07/26/2011    Procedure: BREAST BIOPSY WITH NEEDLE LOCALIZATION;  Surgeon: Emelia Loron, MD;  Location: [facility] OR;  Service: General;  Laterality: Right;  Right breast wire guided biospy Needle localization @ [facility]  7:30    FAMILY HISTORY Family History  Problem Relation Age of Onset  . Cancer Sister     colon  . Cancer Brother     renal  . Heart  disease Brother   . Cancer Maternal Aunt     breast  . Cancer Cousin   . Cancer Cousin   . Anesthesia problems Neg Hx   . Cancer Mother     leukemia  . 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  She lives with her husband.  Her daughter lives next door to her.    ADVANCED DIRECTIVES:  Living will and Healthcare POA  HEALTH MAINTENANCE: History  Substance Use Topics  . Smoking status: Former Smoker -- 0.25 packs/day    Quit date: 07/22/1980  . Smokeless tobacco: Never Used  . Alcohol Use: No    Mammogram: 07/29/11  Colonoscopy: 04/2012  PAP:  03/10/12  Bone density:  07/09/11 with low bone mass  Lipid panel:  Managed by Dr. Renato Gails  Allergies  Allergen Reactions  . Benadryl (Diphenhydramine Hcl) Other (See Comments)    "Super hyper"  . Tape     Skin breakdown/redness   . Boniva (Ibandronate Sodium) Other (See Comments)    Neck, shoulder limited mobility   . Chlorhexidine Gluconate   . Codeine Other (See Comments)    "can't wake up from it."    Current Outpatient Prescriptions  Medication Sig Dispense Refill  . cholecalciferol (VITAMIN D) 1000 UNITS tablet Take 1,000 Units by mouth 2 (two) times daily.      Marland Kitchen GLUCOSA-CHONDR-NA CHONDR-MSM PO Take 2 tablets by mouth daily.       Marland Kitchen ibuprofen (ADVIL,MOTRIN) 100 MG tablet Take 400 mg by mouth every 6 (six) hours as needed. For pain      . levothyroxine (SYNTHROID, LEVOTHROID) 137 MCG tablet Take 137 mcg by mouth every morning.       Marland Kitchen OVER THE COUNTER MEDICATION Take 2 capsules by mouth daily. Fish oil,flaxseed,borage oil,omega 3/6/9 combination softgel      . OVER THE COUNTER MEDICATION Peridin TID      . tamoxifen (NOLVADEX) 20 MG tablet daily.      . tamoxifen (NOLVADEX) 20 MG tablet TAKE 1 TABLET BY MOUTH EVERY DAY  30 tablet  3  . venlafaxine XR (EFFEXOR-XR) 37.5 MG  24 hr capsule Take 1 capsule (37.5 mg total) by mouth daily. Take 1 capsule daily x 2weeks, then may titrate up to 2 po daily  60 capsule  0  . vitamin E 400 UNIT capsule Take 400 Units by mouth 2 (two) times daily.      . VOLTAREN 1 % GEL Ad lib.      . [DISCONTINUED] Calcium Carbonate-Vit D-Min (CALTRATE 600+D PLUS) 600-400 MG-UNIT per tablet Take 2 tablets by mouth daily.       No current facility-administered medications for this visit.  OBJECTIVE: There were no vitals filed for this visit.   There is no weight on file to calculate BMI.    ECOG FS:  Symptomatic but completely ambulatory  General: Well developed, well nourished female in no acute distress. Alert and oriented x 3.  Head/ Neck: Oropharynx clear.  Sclerae anicteric.  Supple without any enlargements.  Lymph node survey: No cervical, supraclavicular, or axillary adenopathy.  Cardiovascular: Regular rate and rhythm. S1 and S2 normal.  Lungs: Clear to auscultation bilaterally. No wheezes/crackles/rhonchi noted.  Skin: No rashes or lesions present. Back: No CVA tenderness.  Abdomen: Abdomen soft, non-tender and obese. Active bowel sounds in all quadrants. No evidence of a fluid wave or abdominal masses.  Breasts:  Inspection negative with no nodularity, masses, erythema, or drainage noted bilaterally. Extremities: No bilateral cyanosis, edema, or clubbing.   LAB RESULTS: Lab Results  Component Value Date   WBC 4.1 11/27/2011   NEUTROABS 2.2 11/27/2011   HGB 12.9 11/27/2011   HCT 38.2 11/27/2011   MCV 92.7 11/27/2011   PLT 194 11/27/2011      Chemistry      Component Value Date/Time   NA 141 11/27/2011 1309   K 4.8 11/27/2011 1309   CL 108 11/27/2011 1309   CO2 24 11/27/2011 1309   BUN 10 11/27/2011 1309   CREATININE 0.79 11/27/2011 1309      Component Value Date/Time   CALCIUM 8.7 11/27/2011 1309   ALKPHOS 40 11/27/2011 1309   AST 13 11/27/2011 1309   ALT 9 11/27/2011 1309   BILITOT 0.5 11/27/2011 1309       Lab  Results  Component Value Date   LABCA2 10 07/03/2011    No components found with this basename: LABCA125    No results found for this basename: INR,  in the last 168 hours  Urinalysis No results found for this basename: colorurine, appearanceur, labspec, phurine, glucoseu, hgbur, bilirubinur, ketonesur, proteinur, urobilinogen, nitrite, leukocytesur    STUDIES: No results found.  ASSESSMENT: 67 y.o. [place] woman: #1  S/P right and left lumpectomy by Dr. Dwain Sarna on 07/26/11 for Tis N0 DCIS, low grade, Stage 0, ER 62%, PR 0%, Ki67 12%, HER2 - with the left breast and an intraductal papilloma with the right breast.  #2  She was started on Tamoxifen in April of 2013.  PLAN:  She is to continue taking Tamoxifen as prescribed.  It is recommended that she begin taking Caltrate plus D twice daily.  She will be contacted with the results of her lab work from today.  She is to plan to have her mammogram in April of 2014 and April of 2015 and follow up with Dr.  Darnelle Catalan in May of 2015 or sooner if needed.  The patient was reviewed with Dr. Darnelle Catalan and he spoke with the patient about future plans and recommendations.  Crystin Lechtenberg DEAL    06/24/2012

## 2012-06-26 ENCOUNTER — Other Ambulatory Visit: Payer: Medicare Other | Admitting: Lab

## 2012-06-26 ENCOUNTER — Ambulatory Visit: Payer: Medicare Other | Admitting: Oncology

## 2012-07-15 ENCOUNTER — Other Ambulatory Visit: Payer: Self-pay | Admitting: Oncology

## 2012-08-04 DIAGNOSIS — Z853 Personal history of malignant neoplasm of breast: Secondary | ICD-10-CM | POA: Diagnosis not present

## 2012-09-23 ENCOUNTER — Other Ambulatory Visit: Payer: Self-pay | Admitting: *Deleted

## 2012-09-23 ENCOUNTER — Other Ambulatory Visit: Payer: Medicare Other

## 2012-09-23 DIAGNOSIS — C50919 Malignant neoplasm of unspecified site of unspecified female breast: Secondary | ICD-10-CM | POA: Diagnosis not present

## 2012-09-23 DIAGNOSIS — M899 Disorder of bone, unspecified: Secondary | ICD-10-CM

## 2012-09-23 DIAGNOSIS — E039 Hypothyroidism, unspecified: Secondary | ICD-10-CM | POA: Diagnosis not present

## 2012-09-23 DIAGNOSIS — Z13220 Encounter for screening for lipoid disorders: Secondary | ICD-10-CM | POA: Diagnosis not present

## 2012-09-24 LAB — COMPREHENSIVE METABOLIC PANEL
ALT: 16 IU/L (ref 0–32)
AST: 19 IU/L (ref 0–40)
Albumin/Globulin Ratio: 1.8 (ref 1.1–2.5)
Albumin: 4.1 g/dL (ref 3.6–4.8)
Alkaline Phosphatase: 33 IU/L — ABNORMAL LOW (ref 39–117)
BUN/Creatinine Ratio: 17 (ref 11–26)
BUN: 14 mg/dL (ref 8–27)
CO2: 25 mmol/L (ref 19–28)
Calcium: 8.5 mg/dL — ABNORMAL LOW (ref 8.6–10.2)
Chloride: 106 mmol/L (ref 97–108)
Creatinine, Ser: 0.81 mg/dL (ref 0.57–1.00)
GFR calc Af Amer: 87 mL/min/{1.73_m2} (ref >59)
GFR calc non Af Amer: 75 mL/min/{1.73_m2} (ref >59)
Globulin, Total: 2.3 g/dL (ref 1.5–4.5)
Glucose: 83 mg/dL (ref 65–99)
Potassium: 4.5 mmol/L (ref 3.5–5.2)
Sodium: 141 mmol/L (ref 134–144)
Total Bilirubin: 0.5 mg/dL (ref 0.0–1.2)
Total Protein: 6.4 g/dL (ref 6.0–8.5)

## 2012-09-24 LAB — LIPID PANEL
Chol/HDL Ratio: 2.4 ratio units (ref 0.0–4.4)
Cholesterol, Total: 153 mg/dL (ref 100–199)
HDL: 65 mg/dL (ref >39)
LDL Calculated: 70 mg/dL (ref 0–99)
Triglycerides: 91 mg/dL (ref 0–149)
VLDL Cholesterol Cal: 18 mg/dL (ref 5–40)

## 2012-09-24 LAB — CBC WITH DIFFERENTIAL/PLATELET
Basophils Absolute: 0 10*3/uL (ref 0.0–0.2)
Basos: 1 % (ref 0–3)
Eos: 2 % (ref 0–5)
Eosinophils Absolute: 0.1 10*3/uL (ref 0.0–0.4)
HCT: 38.3 % (ref 34.0–46.6)
Hemoglobin: 13.1 g/dL (ref 11.1–15.9)
Immature Grans (Abs): 0 10*3/uL (ref 0.0–0.1)
Immature Granulocytes: 0 % (ref 0–2)
Lymphocytes Absolute: 1.2 10*3/uL (ref 0.7–3.1)
Lymphs: 38 % (ref 14–46)
MCH: 32.3 pg (ref 26.6–33.0)
MCHC: 34.2 g/dL (ref 31.5–35.7)
MCV: 95 fL (ref 79–97)
Monocytes Absolute: 0.3 10*3/uL (ref 0.1–0.9)
Monocytes: 8 % (ref 4–12)
Neutrophils Absolute: 1.7 10*3/uL (ref 1.4–7.0)
Neutrophils Relative %: 51 % (ref 40–74)
RBC: 4.05 x10E6/uL (ref 3.77–5.28)
RDW: 13.6 % (ref 12.3–15.4)
WBC: 3.3 10*3/uL — ABNORMAL LOW (ref 3.4–10.8)

## 2012-09-24 LAB — TSH: TSH: 0.529 u[IU]/mL (ref 0.450–4.500)

## 2012-09-25 ENCOUNTER — Encounter: Payer: Self-pay | Admitting: *Deleted

## 2012-09-28 ENCOUNTER — Ambulatory Visit (INDEPENDENT_AMBULATORY_CARE_PROVIDER_SITE_OTHER): Payer: Medicare Other | Admitting: Internal Medicine

## 2012-09-28 ENCOUNTER — Encounter: Payer: Self-pay | Admitting: Internal Medicine

## 2012-09-28 VITALS — BP 108/72 | HR 73 | Temp 98.0°F | Resp 14 | Ht 62.0 in | Wt 150.0 lb

## 2012-09-28 DIAGNOSIS — C50419 Malignant neoplasm of upper-outer quadrant of unspecified female breast: Secondary | ICD-10-CM | POA: Diagnosis not present

## 2012-09-28 DIAGNOSIS — Z23 Encounter for immunization: Secondary | ICD-10-CM

## 2012-09-28 DIAGNOSIS — N951 Menopausal and female climacteric states: Secondary | ICD-10-CM | POA: Diagnosis not present

## 2012-09-28 DIAGNOSIS — C50412 Malignant neoplasm of upper-outer quadrant of left female breast: Secondary | ICD-10-CM

## 2012-09-28 DIAGNOSIS — E039 Hypothyroidism, unspecified: Secondary | ICD-10-CM | POA: Diagnosis not present

## 2012-09-28 DIAGNOSIS — M1990 Unspecified osteoarthritis, unspecified site: Secondary | ICD-10-CM

## 2012-09-28 DIAGNOSIS — Z170 Estrogen receptor positive status [ER+]: Secondary | ICD-10-CM | POA: Insufficient documentation

## 2012-09-28 NOTE — Progress Notes (Signed)
Patient ID: Sylvia [name], female   DOB: 1946-03-15, 67 y.o.   MRN: [id number] Code Status: will discuss living will with her next visit.  Allergies  Allergen Reactions  . Benadryl (Diphenhydramine Hcl) Other (See Comments)    "Super hyper"  . Tape     Skin breakdown/redness   . Boniva (Ibandronate Sodium) Other (See Comments)    Neck, shoulder limited mobility   . Chlorhexidine Gluconate   . Peridin-C (Ascorbic Acid)   . Venlafaxine   . Codeine Other (See Comments)    "can't wake up from it."    Chief Complaint  Patient presents with  . Medical Managment of Chronic Issues    no new problems    HPI: Patient is a 67 y.o. white female seen in the office today for mgt of chronic conditions.  Not many changes--switched to Dr. Zenda Alpers put her on calcium and she goes back in a year.  Hot flashes still there and bad.  Cannot use any estrogen driven products.    Arthritis pain is bad, also.  Bones hurt especially hands.  Doesn't take a lot of ibuprofen.  Occasionally voltaren gel if severe.    Mood is ok.  Has new client and working more now--feels mentally much better since this.  Christmas was hard b/c of losing mom.  Granddaughter graduated last week.  She'll be gone in summer and going to college.    Review of Systems:  Review of Systems  Constitutional: Negative for weight loss.  HENT: Negative for hearing loss.   Eyes: Negative for blurred vision.       Due for routine eye visit in next couple of months  Respiratory: Negative for cough and shortness of breath.   Cardiovascular: Negative for chest pain.  Gastrointestinal: Negative for abdominal pain, constipation, blood in stool and melena.  Genitourinary: Negative for dysuria and urgency.  Musculoskeletal: Positive for joint pain. Negative for falls.       Hip pain improved.  Skin: Negative for rash.  Neurological: Negative for dizziness.       Had a couple episodes where turning her head made her briefly dizzy, but  resolved on their own  Psychiatric/Behavioral: Negative for depression and memory loss. The patient does not have insomnia.        Except if hot flashes awaken her, sleeps well    Past Medical History  Diagnosis Date  . Hypothyroid   . S/P bilateral breast biopsy 2013  . Complication of anesthesia     slow to wake up  . Normal nuclear stress test     [facility], 07/18/2011  . Shortness of breath     /w housework  . Reflux esophagitis     pt. knowledgeable about dietary intake relative to reflux  . Breast cancer of upper-outer quadrant of left female breast     left breast cancer  . Arthritis     OA- fingers, toes, back pain, hip pain   . Symptomatic menopausal or female climacteric states   . Malignant neoplasm of breast (female), unspecified site   . Pain in joint, pelvic region and thigh   . Disorder of bone and cartilage, unspecified   . Lumbago   . Aphasia   . Osteoarthritis (arthritis due to wear and tear of joints)    Past Surgical History  Procedure Laterality Date  . Cataract extraction  2012  . Dilation and curettage of uterus  1978  . Eye surgery  R cat. extracted /w IOL  . Tonsillectomy      as a child  . Ectopic pregnancy surgery      1980  . Breast surgery      R- 1996, benign biopsy,  Dr. Johna Sheriff  . Breast biopsy  07/26/2011    Procedure: BREAST BIOPSY WITH NEEDLE LOCALIZATION;  Surgeon: Emelia Loron, MD;  Location: [facility] OR;  Service: General;  Laterality: Right;  Right breast wire guided biospy Needle localization @ [facility]  7:30   Social History:   reports that she quit smoking about 32 years ago. She has never used smokeless tobacco. She reports that she does not drink alcohol or use illicit drugs.  Family History  Problem Relation Age of Onset  . Cancer Sister     colon  . Cancer Brother     renal  . Heart disease Brother   . Cancer Maternal Aunt     breast  . Cancer Cousin   . Cancer Cousin   . Anesthesia problems Neg Hx   . Cancer  Mother     leukemia  . Cancer Father     lung  . Heart attack Brother     Medications: Patient's Medications  New Prescriptions   No medications on file  Previous Medications   CALCIUM CARBONATE-VITAMIN D (CALCIUM + D PO)    Take 600 mg by mouth 2 (two) times daily.   CHOLECALCIFEROL (VITAMIN D) 1000 UNITS TABLET    Take 1,000 Units by mouth 2 (two) times daily.   GLUCOSA-CHONDR-NA CHONDR-MSM PO    Take 2 tablets by mouth daily.    IBUPROFEN (ADVIL,MOTRIN) 100 MG TABLET    Take 400 mg by mouth every 6 (six) hours as needed. For pain   LEVOTHYROXINE (SYNTHROID, LEVOTHROID) 137 MCG TABLET    Take 137 mcg by mouth every morning.    TAMOXIFEN (NOLVADEX) 20 MG TABLET    TAKE ONE TABLET BY MOUTH EVERY DAY   VITAMIN E 400 UNIT CAPSULE    Take 400 Units by mouth 2 (two) times daily.   VOLTAREN 1 % GEL    Ad lib.  Modified Medications   No medications on file  Discontinued Medications   No medications on file     Physical Exam: Filed Vitals:   09/28/12 0950  BP: 108/72  Pulse: 73  Temp: 98 F (36.7 C)  TempSrc: Oral  Resp: 14  Height: 5\' 2"  (1.575 m)  Weight: 150 lb (68.04 kg)  SpO2: 98%  Physical Exam  Constitutional: She is oriented to person, place, and time. She appears well-developed and well-nourished. No distress.  HENT:  Head: Normocephalic and atraumatic.  Cardiovascular: Normal rate, regular rhythm, normal heart sounds and intact distal pulses.   Pulmonary/Chest: Effort normal and breath sounds normal.  Abdominal: Soft. Bowel sounds are normal.  Musculoskeletal: Normal range of motion. She exhibits no edema and no tenderness.  Neurological: She is alert and oriented to person, place, and time.  Skin: Skin is warm and dry.  Psychiatric: She has a normal mood and affect. Her behavior is normal. Judgment and thought content normal.   Labs reviewed: Basic Metabolic Panel:  Recent Labs  16/10/96 1309 06/24/12 1207 09/23/12 1027  NA 141 141 141  K 4.8 4.3 4.5   CL 108 107 106  CO2 24 26 25   GLUCOSE 84 91 83  BUN 10 10.7 14  CREATININE 0.79 0.8 0.81  CALCIUM 8.7 8.6 8.5*  TSH  --   --  0.529   Liver Function Tests:  Recent Labs  11/27/11 1309 06/24/12 1207 09/23/12 1027  AST 13 20 19   ALT 9 19 16   ALKPHOS 40 45 33*  BILITOT 0.5 0.45 0.5  PROT 6.4 7.0 6.4  ALBUMIN 4.0 3.7  --   CBC:  Recent Labs  11/27/11 1309 06/24/12 1207 09/23/12 1027  WBC 4.1 4.5 3.3*  NEUTROABS 2.2 2.6 1.7  HGB 12.9 13.1 13.1  HCT 38.2 38.1 38.3  MCV 92.7 92.5 95  PLT 194 186  --    Lipid Panel:  Recent Labs  09/23/12 1027  HDL 65  LDLCALC 70  TRIG 91  CHOLHDL 2.4   Past Procedures:  Assessment/Plan Cancer of upper-outer quadrant of female breast Is to f/u in a year after her mammogram.  Doing well aside from hot flashes from tamoxifen.    Hypothyroid TSH level normal.  Discussed proper administration of synthroid is first thing in am separate from other meds, especially calcium.  Symptomatic menopausal or female climacteric states Persistent hot flashes.  Cannot take any of the treatments for these due to ER positive breast cancer.    Osteoarthritis (arthritis due to wear and tear of joints) Is stable.  Seems most bothersome in her fingers these days.  Occasionally, right hip is problematic, but generally better lately.  Rarely uses ibuprofen.  Need for prophylactic vaccination against Streptococcus pneumoniae (pneumococcus) Due for pneumovax--was given today and will not require any further vaccinations for this.     Labs/tests ordered:  Tsh, cmp, cbc

## 2012-09-28 NOTE — Assessment & Plan Note (Signed)
Due for pneumovax--was given today and will not require any further vaccinations for this.

## 2012-09-28 NOTE — Assessment & Plan Note (Signed)
TSH level normal.  Discussed proper administration of synthroid is first thing in am separate from other meds, especially calcium.

## 2012-09-28 NOTE — Assessment & Plan Note (Signed)
Is to f/u in a year after her mammogram.  Doing well aside from hot flashes from tamoxifen.

## 2012-09-28 NOTE — Assessment & Plan Note (Signed)
Persistent hot flashes.  Cannot take any of the treatments for these due to ER positive breast cancer.

## 2012-09-28 NOTE — Assessment & Plan Note (Signed)
Is stable.  Seems most bothersome in her fingers these days.  Occasionally, right hip is problematic, but generally better lately.  Rarely uses ibuprofen.

## 2012-10-15 ENCOUNTER — Other Ambulatory Visit: Payer: Self-pay | Admitting: Oncology

## 2012-10-15 DIAGNOSIS — C50919 Malignant neoplasm of unspecified site of unspecified female breast: Secondary | ICD-10-CM

## 2012-10-27 ENCOUNTER — Encounter: Payer: Self-pay | Admitting: Internal Medicine

## 2012-10-27 ENCOUNTER — Encounter: Payer: Self-pay | Admitting: *Deleted

## 2012-12-24 ENCOUNTER — Other Ambulatory Visit: Payer: Medicare Other

## 2013-03-04 ENCOUNTER — Other Ambulatory Visit: Payer: Self-pay

## 2013-03-15 DIAGNOSIS — Z124 Encounter for screening for malignant neoplasm of cervix: Secondary | ICD-10-CM | POA: Diagnosis not present

## 2013-04-02 ENCOUNTER — Ambulatory Visit (INDEPENDENT_AMBULATORY_CARE_PROVIDER_SITE_OTHER): Payer: Medicare Other | Admitting: Internal Medicine

## 2013-04-02 ENCOUNTER — Encounter: Payer: Self-pay | Admitting: Internal Medicine

## 2013-04-02 VITALS — BP 114/66 | HR 68 | Temp 98.0°F | Resp 14 | Wt 149.4 lb

## 2013-04-02 DIAGNOSIS — Z23 Encounter for immunization: Secondary | ICD-10-CM | POA: Diagnosis not present

## 2013-04-02 DIAGNOSIS — N951 Menopausal and female climacteric states: Secondary | ICD-10-CM

## 2013-04-02 DIAGNOSIS — C50919 Malignant neoplasm of unspecified site of unspecified female breast: Secondary | ICD-10-CM | POA: Diagnosis not present

## 2013-04-02 DIAGNOSIS — C50412 Malignant neoplasm of upper-outer quadrant of left female breast: Secondary | ICD-10-CM

## 2013-04-02 DIAGNOSIS — C50419 Malignant neoplasm of upper-outer quadrant of unspecified female breast: Secondary | ICD-10-CM | POA: Diagnosis not present

## 2013-04-02 DIAGNOSIS — E039 Hypothyroidism, unspecified: Secondary | ICD-10-CM

## 2013-04-02 DIAGNOSIS — M1990 Unspecified osteoarthritis, unspecified site: Secondary | ICD-10-CM

## 2013-04-02 DIAGNOSIS — M899 Disorder of bone, unspecified: Secondary | ICD-10-CM

## 2013-04-02 MED ORDER — TETANUS-DIPHTHERIA TOXOIDS TD 2-2 LF/0.5ML IM SUSP: 0.5000 mL | Freq: Once | INTRAMUSCULAR | Status: DC

## 2013-04-02 NOTE — Progress Notes (Deleted)
Patient ID: Sylvia [name], female   DOB: May 23, 1945, 67 y.o.   MRN: [id number]  Location: Provider:  Gwenith Spitz. Renato Gails, D.O., C.M.D.  Code Status:  ***  Chief Complaint  Patient presents with  . Medical Managment of Chronic Issues    6 month f/u  . Immunizations    will get Flu vaccine and print Tdap  . other    ? about Tamoxifin side effects, wants to discuss    HPI:  *** Review of Systems:  *** Medications: Patient's Medications  New Prescriptions   No medications on file  Previous Medications   CALCIUM CARBONATE-VITAMIN D (CALCIUM + D PO)    Take 600 mg by mouth 2 (two) times daily.   CHOLECALCIFEROL (VITAMIN D) 1000 UNITS TABLET    Take 1,000 Units by mouth 2 (two) times daily.   DIPTHERIA-TETANUS TOXOIDS (DECAVAC) 2-2 LF/0.5ML INJECTION    Inject 0.5 mLs into the muscle once.   GLUCOSA-CHONDR-NA CHONDR-MSM PO    Takes 2 tablet PRN   IBUPROFEN (ADVIL,MOTRIN) 100 MG TABLET    Take 400 mg by mouth every 6 (six) hours as needed. For pain   LEVOTHYROXINE (SYNTHROID, LEVOTHROID) 137 MCG TABLET    Take 137 mcg by mouth every morning.    TAMOXIFEN (NOLVADEX) 20 MG TABLET    TAKE ONE TABLET BY MOUTH EVERY DAY.   VITAMIN E 400 UNIT CAPSULE    Take 400 Units by mouth 2 (two) times daily.   VOLTAREN 1 % GEL    Ad lib.  Modified Medications   No medications on file  Discontinued Medications   No medications on file    Physical Exam: Filed Vitals:   04/02/13 1010  BP: 114/66  Pulse: 68  Temp: 98 F (36.7 C)  TempSrc: Oral  Resp: 14  Weight: 149 lb 6.4 oz (67.767 kg)  SpO2: 98%   *** (delete stars and type .physexam)  Labs reviewed: Basic Metabolic Panel:  Recent Labs  41/32/44 1207 09/23/12 1027  NA 141 141  K 4.3 4.5  CL 107 106  CO2 26 25  GLUCOSE 91 83  BUN 10.7 14  CREATININE 0.8 0.81  CALCIUM 8.6 8.5*    Liver Function Tests:  Recent Labs  06/24/12 1207 09/23/12 1027  AST 20 19  ALT 19 16  ALKPHOS 45 33*  BILITOT 0.45 0.5  PROT 7.0 6.4    ALBUMIN 3.7  --     CBC:  Recent Labs  06/24/12 1207 09/23/12 1027  WBC 4.5 3.3*  NEUTROABS 2.6 1.7  HGB 13.1 13.1  HCT 38.1 38.3  MCV 92.5 95  PLT 186  --    Significant Diagnostic Results: ***  Assessment/Plan No problem-specific assessment & plan notes found for this encounter.   Family/ staff Communication: ***  Goals of care: ***  Labs/tests ordered:  ***

## 2013-04-02 NOTE — Progress Notes (Signed)
Patient ID: Sylvia [name], female   DOB: 04/10/1946, 67 y.o.   MRN: [id number]   Location:  [facility]  Allergies  Allergen Reactions  . Benadryl [Diphenhydramine Hcl] Other (See Comments)    "Super hyper"  . Tape     Skin breakdown/redness   . Boniva [Ibandronate Sodium] Other (See Comments)    Neck, shoulder limited mobility   . Chlorhexidine Gluconate   . Peridin-C [Ascorbic Acid]   . Venlafaxine   . Codeine Other (See Comments)    "can't wake up from it."    Chief Complaint  Patient presents with  . Medical Managment of Chronic Issues    6 month f/u  . Immunizations    will get Flu vaccine and print Tdap  . other    ? about Tamoxifen side effects, wants to discuss    HPI: Patient is a 67 y.o.  seen in the office today for medical mgt of chronic diseases.  She is doing well.   Had her breast cancer follow up appt back in April and doesn't go again until next year.   Hd cscope 2013. Just had mammogram in april and pap smear this month. Getting flu shot today.  Wants to discuss tamoxifen and bone cancer Had death in family over thanksgiving and one of her cousins asked about the tamoxifen.  Pt only has increased hot flashes, but otherwise ok.  Cousin's mother got bone cancer 10 years after tamoxifen completed.  Wonders what is worse, recurrent breast cancer in 5 years vs. Bone cancer 10 years later.  I discussed that she should ask the oncologist more about this--advised calling and leaving a message with the MA or nurse there so they could respond.    Will get labs today.  Arthritis in fingers is really bothering her.  Hips are doing better.  Also has a little in her toes.  Still walking and going up and down the stairs.  Has some difficulty gripping things.  Doesn't use voltaren very much.  Weather changes cause flare-ups.    Review of Systems:  Review of Systems  Constitutional: Negative for fever, chills and weight loss.        Hot flashes  HENT: Negative for congestion and hearing loss.   Eyes: Negative for blurred vision.  Respiratory: Negative for cough and shortness of breath.   Cardiovascular: Negative for chest pain and leg swelling.  Gastrointestinal: Negative for heartburn, constipation, blood in stool and melena.  Genitourinary: Negative for dysuria.  Musculoskeletal: Positive for joint pain and myalgias.  Skin: Negative for itching and rash.  Neurological: Negative for dizziness, loss of consciousness and weakness.  Psychiatric/Behavioral: Negative for depression and memory loss.    Past Medical History  Diagnosis Date  . Hypothyroid   . S/P bilateral breast biopsy 2013  . Complication of anesthesia     slow to wake up  . Normal nuclear stress test     [facility], 07/18/2011  . Shortness of breath     /w housework  . Reflux esophagitis     pt. knowledgeable about dietary intake relative to reflux  . Breast cancer of upper-outer quadrant of left female breast     left breast cancer  . Arthritis     OA- fingers, toes, back pain, hip pain   . Symptomatic menopausal or female climacteric states   . Malignant neoplasm of breast (female), unspecified site   . Pain in joint, pelvic region  and thigh   . Disorder of bone and cartilage, unspecified   . Lumbago   . Aphasia   . Osteoarthritis (arthritis due to wear and tear of joints)     Past Surgical History  Procedure Laterality Date  . Cataract extraction  2012  . Dilation and curettage of uterus  1978  . Eye surgery      R cat. extracted /w IOL  . Tonsillectomy      as a child  . Ectopic pregnancy surgery      1980  . Breast surgery      Procedure: BREAST BIOPSY WITH NEEDLE LOCALIZATION;  Sur  . Breast biopsy  07/26/2011    Procedure: BREAST BIOPSY WITH NEEDLE LOCALIZATION;  Surgeon: Emelia Loron, MD;  Location: [facility] OR;  Service: General;  Laterality: Right;  Right breast wire guided biospy Needle localization @ [facility]  7:30     Social History:   reports that she quit smoking about 32 years ago. She has never used smokeless tobacco. She reports that she does not drink alcohol or use illicit drugs.  Family History  Problem Relation Age of Onset  . Cancer Sister     colon  . Cancer Brother     renal  . Heart disease Brother   . Cancer Maternal Aunt     breast  . Cancer Cousin   . Cancer Cousin   . Anesthesia problems Neg Hx   . Cancer Mother     leukemia  . Cancer Father     lung  . Heart attack Brother     Medications: Patient's Medications  New Prescriptions   No medications on file  Previous Medications   CALCIUM CARBONATE-VITAMIN D (CALCIUM + D PO)    Take 600 mg by mouth 2 (two) times daily.   CHOLECALCIFEROL (VITAMIN D) 1000 UNITS TABLET    Take 1,000 Units by mouth 2 (two) times daily.   GLUCOSA-CHONDR-NA CHONDR-MSM PO    Takes 2 tablet PRN   IBUPROFEN (ADVIL,MOTRIN) 100 MG TABLET    Take 400 mg by mouth every 6 (six) hours as needed. For pain   LEVOTHYROXINE (SYNTHROID, LEVOTHROID) 137 MCG TABLET    Take 137 mcg by mouth every morning.    TAMOXIFEN (NOLVADEX) 20 MG TABLET    TAKE ONE TABLET BY MOUTH EVERY DAY.   VITAMIN E 400 UNIT CAPSULE    Take 400 Units by mouth 2 (two) times daily.   VOLTAREN 1 % GEL    Ad lib.  Modified Medications   Modified Medication Previous Medication   DIPTHERIA-TETANUS TOXOIDS (DECAVAC) 2-2 LF/0.5ML INJECTION diptheria-tetanus toxoids (DECAVAC) 2-2 LF/0.5ML injection      Inject 0.5 mLs into the muscle once.    Inject 0.5 mLs into the muscle once.  Discontinued Medications   No medications on file     Physical Exam: Filed Vitals:   04/02/13 1010  BP: 114/66  Pulse: 68  Temp: 98 F (36.7 C)  TempSrc: Oral  Resp: 14  Weight: 149 lb 6.4 oz (67.767 kg)  SpO2: 98%  Physical Exam  Constitutional: She is oriented to person, place, and time. She appears well-developed and well-nourished. No distress.  HENT:  Head: Normocephalic and atraumatic.   Cardiovascular: Normal rate, regular rhythm, normal heart sounds and intact distal pulses.   No murmur heard. Pulmonary/Chest: Effort normal and breath sounds normal. No respiratory distress.  Abdominal: Soft. Bowel sounds are normal. She exhibits no distension. There is no tenderness.  Musculoskeletal: Normal  range of motion. She exhibits tenderness. She exhibits no edema.  Of finger joints  Neurological: She is alert and oriented to person, place, and time. She has normal reflexes. No cranial nerve deficit.  Skin: Skin is warm and dry. There is pallor.  Psychiatric: She has a normal mood and affect. Her behavior is normal. Judgment and thought content normal.    Labs reviewed: Basic Metabolic Panel:  Recent Labs  78/29/56 1207 09/23/12 1027  NA 141 141  K 4.3 4.5  CL 107 106  CO2 26 25  GLUCOSE 91 83  BUN 10.7 14  CREATININE 0.8 0.81  CALCIUM 8.6 8.5*  TSH  --  0.529   Liver Function Tests:  Recent Labs  06/24/12 1207 09/23/12 1027  AST 20 19  ALT 19 16  ALKPHOS 45 33*  BILITOT 0.45 0.5  PROT 7.0 6.4  ALBUMIN 3.7  --    CBC:  Recent Labs  06/24/12 1207 09/23/12 1027  WBC 4.5 3.3*  NEUTROABS 2.6 1.7  HGB 13.1 13.1  HCT 38.1 38.3  MCV 92.5 95  PLT 186  --    Lipid Panel:  Recent Labs  09/23/12 1027  HDL 65  LDLCALC 70  TRIG 91  CHOLHDL 2.4    Assessment/Plan 1. Need for prophylactic vaccination with combined diphtheria-tetanus-pertussis (DTP) vaccine -[facility] vaccine to be administered there - diptheria-tetanus toxoids [facility]) 2-2 LF/0.5ML injection; Inject 0.5 mLs into the muscle once.  Dispense: 0.5 mL; Refill: 0  2. Malignant neoplasm of breast (female), unspecified site -doing well, no recurrence, taking tamoxifen which has worsened her hot flashes, but she has questions about bone cancer risk from this--I looked it up but could not find good data about this risk;  I also wondered if her relative actually had bony mets  from the breast cancer she'd previously had  3. Hypothyroid - clinically euthyroid -check TSH  4. Osteoarthritis (arthritis due to wear and tear of joints) -worsening in fingers and toes--only uses a little bit of voltaren gel when her pain is really bad -does not want additional meds  5. Disorder of bone and cartilage, unspecified -cont ca with D and weighbearing exercises  6. Symptomatic menopausal or female climacteric states -due to tamoxifen also -f/u labs as planned: - TSH - CMP - CBC with Differential  7. Breast cancer of upper-outer quadrant of left female breast -has been doing well, keep f/u with oncology as planned - CMP - CBC with Differential  Labs/tests ordered:  Cbc, cmp, tsh Next appt:  6 mos EV

## 2013-04-03 LAB — CBC WITH DIFFERENTIAL/PLATELET
Basophils Absolute: 0 10*3/uL (ref 0.0–0.2)
Basos: 1 %
Eos: 1 %
Eosinophils Absolute: 0 10*3/uL (ref 0.0–0.4)
HCT: 36.6 % (ref 34.0–46.6)
Hemoglobin: 12.7 g/dL (ref 11.1–15.9)
Immature Grans (Abs): 0 10*3/uL (ref 0.0–0.1)
Immature Granulocytes: 0 %
Lymphocytes Absolute: 1.6 10*3/uL (ref 0.7–3.1)
Lymphs: 39 %
MCH: 31.4 pg (ref 26.6–33.0)
MCHC: 34.7 g/dL (ref 31.5–35.7)
MCV: 90 fL (ref 79–97)
Monocytes Absolute: 0.3 10*3/uL (ref 0.1–0.9)
Monocytes: 8 %
Neutrophils Absolute: 2.1 10*3/uL (ref 1.4–7.0)
Neutrophils Relative %: 51 %
RBC: 4.05 x10E6/uL (ref 3.77–5.28)
RDW: 13.5 % (ref 12.3–15.4)
WBC: 4.1 10*3/uL (ref 3.4–10.8)

## 2013-04-03 LAB — COMPREHENSIVE METABOLIC PANEL
ALT: 17 IU/L (ref 0–32)
AST: 18 IU/L (ref 0–40)
Albumin/Globulin Ratio: 1.7 (ref 1.1–2.5)
Albumin: 4 g/dL (ref 3.6–4.8)
Alkaline Phosphatase: 34 IU/L — ABNORMAL LOW (ref 39–117)
BUN/Creatinine Ratio: 16 (ref 11–26)
BUN: 12 mg/dL (ref 8–27)
CO2: 24 mmol/L (ref 18–29)
Calcium: 8.6 mg/dL (ref 8.6–10.2)
Chloride: 103 mmol/L (ref 97–108)
Creatinine, Ser: 0.76 mg/dL (ref 0.57–1.00)
GFR calc Af Amer: 93 mL/min/{1.73_m2} (ref >59)
GFR calc non Af Amer: 81 mL/min/{1.73_m2} (ref >59)
Globulin, Total: 2.3 g/dL (ref 1.5–4.5)
Glucose: 84 mg/dL (ref 65–99)
Potassium: 4.2 mmol/L (ref 3.5–5.2)
Sodium: 140 mmol/L (ref 134–144)
Total Bilirubin: 0.5 mg/dL (ref 0.0–1.2)
Total Protein: 6.3 g/dL (ref 6.0–8.5)

## 2013-04-03 LAB — TSH: TSH: 0.179 u[IU]/mL — ABNORMAL LOW (ref 0.450–4.500)

## 2013-04-05 ENCOUNTER — Telehealth: Payer: Self-pay

## 2013-04-05 ENCOUNTER — Encounter: Payer: Self-pay | Admitting: Internal Medicine

## 2013-04-05 MED ORDER — LEVOTHYROXINE SODIUM 125 MCG PO TABS: 125.0000 ug | ORAL_TABLET | Freq: Every day | ORAL | Status: DC

## 2013-04-05 NOTE — Telephone Encounter (Signed)
Message copied by Maurice Small on Mon Apr 05, 2013 11:22 AM ------      Message from: [place], [place] L      Created: Mon Apr 05, 2013  8:53 AM       TSH is low meaning she is getting too much thyroid replacement.  Decrease dose to synthroid each morning on empty stomach. ------

## 2013-04-05 NOTE — Telephone Encounter (Signed)
Discussed with patient, patient verbalized understanding of results. New RX sent to [facility] on [address]

## 2013-04-15 DIAGNOSIS — Z23 Encounter for immunization: Secondary | ICD-10-CM | POA: Diagnosis not present

## 2013-05-08 DIAGNOSIS — J019 Acute sinusitis, unspecified: Secondary | ICD-10-CM | POA: Diagnosis not present

## 2013-08-05 DIAGNOSIS — R928 Other abnormal and inconclusive findings on diagnostic imaging of breast: Secondary | ICD-10-CM | POA: Diagnosis not present

## 2013-08-05 DIAGNOSIS — M899 Disorder of bone, unspecified: Secondary | ICD-10-CM | POA: Diagnosis not present

## 2013-08-05 DIAGNOSIS — M949 Disorder of cartilage, unspecified: Secondary | ICD-10-CM | POA: Diagnosis not present

## 2013-08-05 DIAGNOSIS — Z853 Personal history of malignant neoplasm of breast: Secondary | ICD-10-CM | POA: Diagnosis not present

## 2013-08-10 ENCOUNTER — Encounter: Payer: Self-pay | Admitting: Internal Medicine

## 2013-08-24 ENCOUNTER — Telehealth: Payer: Self-pay | Admitting: Oncology

## 2013-08-24 ENCOUNTER — Encounter: Payer: Self-pay | Admitting: Internal Medicine

## 2013-08-24 DIAGNOSIS — T887XXA Unspecified adverse effect of drug or medicament, initial encounter: Secondary | ICD-10-CM

## 2013-08-24 DIAGNOSIS — E039 Hypothyroidism, unspecified: Secondary | ICD-10-CM

## 2013-08-24 NOTE — Telephone Encounter (Signed)
per pof GM to move pt May appt to Sept-cld & talkd w/pt to adv of new date & time

## 2013-08-25 NOTE — Addendum Note (Signed)
Addended by: Logan Bores on: 08/25/2013 04:38 PM   Modules accepted: Orders

## 2013-08-25 NOTE — Telephone Encounter (Signed)
Open in error

## 2013-09-14 ENCOUNTER — Other Ambulatory Visit: Payer: Medicare Other

## 2013-09-14 ENCOUNTER — Encounter: Payer: Medicare Other | Admitting: Oncology

## 2013-09-17 DIAGNOSIS — J019 Acute sinusitis, unspecified: Secondary | ICD-10-CM | POA: Diagnosis not present

## 2013-09-17 DIAGNOSIS — J209 Acute bronchitis, unspecified: Secondary | ICD-10-CM | POA: Diagnosis not present

## 2013-09-17 DIAGNOSIS — T148 Other injury of unspecified body region: Secondary | ICD-10-CM | POA: Diagnosis not present

## 2013-10-04 ENCOUNTER — Other Ambulatory Visit: Payer: Self-pay | Admitting: *Deleted

## 2013-10-04 ENCOUNTER — Other Ambulatory Visit: Payer: Medicare Other

## 2013-10-04 DIAGNOSIS — T887XXA Unspecified adverse effect of drug or medicament, initial encounter: Secondary | ICD-10-CM

## 2013-10-04 DIAGNOSIS — E039 Hypothyroidism, unspecified: Secondary | ICD-10-CM | POA: Diagnosis not present

## 2013-10-04 MED ORDER — LEVOTHYROXINE SODIUM 125 MCG PO TABS: 125.0000 ug | ORAL_TABLET | Freq: Every day | ORAL | Status: DC

## 2013-10-04 NOTE — Telephone Encounter (Signed)
Patient requested 3 days worth because she is coming in on Wednesday to be seen and don't know if her thyroid medication will be changed or not. Just got labs done today

## 2013-10-05 ENCOUNTER — Telehealth: Payer: Self-pay

## 2013-10-05 LAB — COMPREHENSIVE METABOLIC PANEL
ALT: 21 IU/L (ref 0–32)
AST: 21 IU/L (ref 0–40)
Albumin/Globulin Ratio: 1.8 (ref 1.1–2.5)
Albumin: 3.9 g/dL (ref 3.6–4.8)
Alkaline Phosphatase: 35 IU/L — ABNORMAL LOW (ref 39–117)
BUN/Creatinine Ratio: 15 (ref 11–26)
BUN: 12 mg/dL (ref 8–27)
CO2: 24 mmol/L (ref 18–29)
Calcium: 8.2 mg/dL — ABNORMAL LOW (ref 8.7–10.3)
Chloride: 102 mmol/L (ref 97–108)
Creatinine, Ser: 0.82 mg/dL (ref 0.57–1.00)
GFR calc Af Amer: 84 mL/min/{1.73_m2} (ref >59)
GFR calc non Af Amer: 73 mL/min/{1.73_m2} (ref >59)
Globulin, Total: 2.2 g/dL (ref 1.5–4.5)
Glucose: 84 mg/dL (ref 65–99)
Potassium: 4.2 mmol/L (ref 3.5–5.2)
Sodium: 139 mmol/L (ref 134–144)
Total Bilirubin: 0.4 mg/dL (ref 0.0–1.2)
Total Protein: 6.1 g/dL (ref 6.0–8.5)

## 2013-10-05 LAB — CBC WITH DIFFERENTIAL/PLATELET
Basophils Absolute: 0 10*3/uL (ref 0.0–0.2)
Basos: 1 %
Eos: 3 %
Eosinophils Absolute: 0.1 10*3/uL (ref 0.0–0.4)
HCT: 37.2 % (ref 34.0–46.6)
Hemoglobin: 12.6 g/dL (ref 11.1–15.9)
Immature Grans (Abs): 0 10*3/uL (ref 0.0–0.1)
Immature Granulocytes: 0 %
Lymphocytes Absolute: 1.4 10*3/uL (ref 0.7–3.1)
Lymphs: 36 %
MCH: 31 pg (ref 26.6–33.0)
MCHC: 33.9 g/dL (ref 31.5–35.7)
MCV: 91 fL (ref 79–97)
Monocytes Absolute: 0.4 10*3/uL (ref 0.1–0.9)
Monocytes: 9 %
Neutrophils Absolute: 2 10*3/uL (ref 1.4–7.0)
Neutrophils Relative %: 51 %
RBC: 4.07 x10E6/uL (ref 3.77–5.28)
RDW: 13.9 % (ref 12.3–15.4)
WBC: 4 10*3/uL (ref 3.4–10.8)

## 2013-10-05 LAB — TSH: TSH: 0.057 u[IU]/mL — ABNORMAL LOW (ref 0.450–4.500)

## 2013-10-05 LAB — LIPID PANEL
Chol/HDL Ratio: 2.6 ratio units (ref 0.0–4.4)
Cholesterol, Total: 153 mg/dL (ref 100–199)
HDL: 60 mg/dL (ref >39)
LDL Calculated: 71 mg/dL (ref 0–99)
Triglycerides: 110 mg/dL (ref 0–149)
VLDL Cholesterol Cal: 22 mg/dL (ref 5–40)

## 2013-10-05 MED ORDER — LEVOTHYROXINE SODIUM 100 MCG PO TABS: 100.0000 ug | ORAL_TABLET | Freq: Every day | ORAL | Status: DC

## 2013-10-05 NOTE — Telephone Encounter (Signed)
Message copied by Logan Bores on Tue Oct 05, 2013  2:51 PM ------      Message from: [place], Rexene Edison      Created: Tue Oct 05, 2013  7:57 AM       Labs are normal except TSH remains low.  Reduce synthroid to 145mcg from 183mcg daily first thing in am on empty stomach. ------

## 2013-10-05 NOTE — Telephone Encounter (Signed)
Discussed with patient, patient verbalized understanding of results. RX sent to [facility].

## 2013-10-07 ENCOUNTER — Ambulatory Visit (INDEPENDENT_AMBULATORY_CARE_PROVIDER_SITE_OTHER): Payer: Medicare Other | Admitting: Internal Medicine

## 2013-10-07 ENCOUNTER — Encounter: Payer: Self-pay | Admitting: Internal Medicine

## 2013-10-07 VITALS — BP 122/74 | HR 74 | Temp 98.5°F | Resp 10 | Ht 62.5 in | Wt 152.2 lb

## 2013-10-07 DIAGNOSIS — C50419 Malignant neoplasm of upper-outer quadrant of unspecified female breast: Secondary | ICD-10-CM

## 2013-10-07 DIAGNOSIS — Z23 Encounter for immunization: Secondary | ICD-10-CM

## 2013-10-07 DIAGNOSIS — N951 Menopausal and female climacteric states: Secondary | ICD-10-CM

## 2013-10-07 DIAGNOSIS — E039 Hypothyroidism, unspecified: Secondary | ICD-10-CM | POA: Diagnosis not present

## 2013-10-07 DIAGNOSIS — Z79810 Long term (current) use of selective estrogen receptor modulators (SERMs): Secondary | ICD-10-CM | POA: Diagnosis not present

## 2013-10-07 DIAGNOSIS — M1990 Unspecified osteoarthritis, unspecified site: Secondary | ICD-10-CM

## 2013-10-07 DIAGNOSIS — C50412 Malignant neoplasm of upper-outer quadrant of left female breast: Secondary | ICD-10-CM

## 2013-10-07 MED ORDER — ZOSTER VACCINE LIVE 19400 UNT/0.65ML ~~LOC~~ SOLR: 0.6500 mL | Freq: Once | SUBCUTANEOUS | Status: DC

## 2013-10-07 NOTE — Progress Notes (Signed)
Patient ID: Sylvia [name], female   DOB: 15-May-1945, 68 y.o.   MRN: [id number]   Location:  [facility]  Code Status: DNR   Allergies  Allergen Reactions  . Benadryl [Diphenhydramine Hcl] Other (See Comments)    "Super hyper"  . Tape     Skin breakdown/redness   . Boniva [Ibandronate Sodium] Other (See Comments)    Neck, shoulder limited mobility   . Chlorhexidine Gluconate   . Peridin-C [Ascorbic Acid]   . Venlafaxine   . Codeine Other (See Comments)    "can't wake up from it."    Chief Complaint  Patient presents with  . Medical Management of Chronic Issues    6 month follow-up, discuss labs (copy printed)   . Results    BMD results   . Immunizations    Discuss Prevnar Vaccine     HPI: Patient is a 68 y.o. white female seen in the office today for medical mgt of chronic diseases.  She is here to f/u on her labs and bone density test as well as discuss prevnar vaccine.    Has had a couple of sinus episodes with bronchitis.  Happened in January and again in May.  Was down almost a month.  Still having a lot of postnasal drip and phlegm coming up.  Not taking any allergy medicine.  Wants to avoid if she can.    Osteoarthritis of fingers no worse.    Is taking calcium with D 600mg  po bid.  Eats yogurt and drinks almond milk.  Doing walking and goes up and down steps a lot.    Had mammogram last month and sonogram.  Something in milk gland on right breast so needs 6 month f/u mammo.  Next appt is now in sept at [facility] center.  Continues on tamoxifen intended for a total of 5 years.  3 different people she talked to had recurrence when they stopped it.    Review of Systems:  Review of Systems  Constitutional: Negative for fever, weight loss and malaise/fatigue.  HENT: Negative for congestion.   Eyes: Negative for blurred vision.  Respiratory: Negative for cough and shortness of breath.   Cardiovascular: Negative for chest pain,  palpitations and leg swelling.  Gastrointestinal: Positive for constipation. Negative for abdominal pain, diarrhea, blood in stool and melena.  Genitourinary: Negative for dysuria.  Musculoskeletal: Positive for joint pain. Negative for falls and myalgias.  Skin: Negative for rash.  Neurological: Negative for dizziness, loss of consciousness, weakness and headaches.  Endo/Heme/Allergies:       Hot flashes  Psychiatric/Behavioral: Negative for depression and memory loss.    Past Medical History  Diagnosis Date  . Hypothyroid   . S/P bilateral breast biopsy 2013  . Complication of anesthesia     slow to wake up  . Normal nuclear stress test     [facility], 07/18/2011  . Shortness of breath     /w housework  . Reflux esophagitis     pt. knowledgeable about dietary intake relative to reflux  . Breast cancer of upper-outer quadrant of left female breast     left breast cancer  . Arthritis     OA- fingers, toes, back pain, hip pain   . Symptomatic menopausal or female climacteric states   . Malignant neoplasm of breast (female), unspecified site   . Pain in joint, pelvic region and thigh   . Disorder of bone and cartilage, unspecified   .  Lumbago   . Aphasia   . Osteoarthritis (arthritis due to wear and tear of joints)     Past Surgical History  Procedure Laterality Date  . Cataract extraction  2012  . Dilation and curettage of uterus  1978  . Eye surgery      R cat. extracted /w IOL  . Tonsillectomy      as a child  . Ectopic pregnancy surgery      1980  . Breast surgery      Procedure: BREAST BIOPSY WITH NEEDLE LOCALIZATION;  Sur  . Breast biopsy  07/26/2011    Procedure: BREAST BIOPSY WITH NEEDLE LOCALIZATION;  Surgeon: Rolm Bookbinder, MD;  Location: [place];  Service: General;  Laterality: Right;  Right breast wire guided biospy Needle localization @ [facility]  7:30    Social History:   reports that she quit smoking about 33 years ago. She has never used smokeless  tobacco. She reports that she does not drink alcohol or use illicit drugs.  Family History  Problem Relation Age of Onset  . Cancer Sister     colon  . Cancer Brother     renal  . Heart disease Brother   . Cancer Maternal Aunt     breast  . Cancer Cousin   . Cancer Cousin   . Anesthesia problems Neg Hx   . Cancer Mother     leukemia  . Cancer Father     lung  . Heart attack Brother     Medications: Patient's Medications  New Prescriptions   No medications on file  Previous Medications   CALCIUM CARBONATE-VITAMIN D (CALCIUM + D PO)    Take 600 mg by mouth 2 (two) times daily.   CHOLECALCIFEROL (VITAMIN D) 1000 UNITS TABLET    Take 1,000 Units by mouth 2 (two) times daily.   DIPTHERIA-TETANUS TOXOIDS (DECAVAC) 2-2 LF/0.5ML INJECTION    Inject 0.5 mLs into the muscle once.   IBUPROFEN (ADVIL,MOTRIN) 100 MG TABLET    Take 400 mg by mouth every 6 (six) hours as needed. For pain   LEVOTHYROXINE (SYNTHROID, LEVOTHROID) 100 MCG TABLET    Take 1 tablet (100 mcg total) by mouth daily before breakfast.   TAMOXIFEN (NOLVADEX) 20 MG TABLET    TAKE ONE TABLET BY MOUTH EVERY DAY.   VITAMIN E 400 UNIT CAPSULE    Take 400 Units by mouth 2 (two) times daily.   VOLTAREN 1 % GEL    Ad lib.  Modified Medications   Modified Medication Previous Medication   ZOSTER VACCINE LIVE, PF, (ZOSTAVAX) 16109 UNT/0.65ML INJECTION zoster vaccine live, PF, (ZOSTAVAX) 60454 UNT/0.65ML injection      Inject 19,400 Units into the skin once.    Inject 0.65 mLs into the skin once.  Discontinued Medications   GLUCOSA-CHONDR-NA CHONDR-MSM PO    Takes 2 tablet PRN     Physical Exam: Filed Vitals:   10/07/13 0957  Height: 5' 2.5" (1.588 m)  Weight: 152 lb 3.2 oz (69.037 kg)  Physical Exam  Constitutional: She is oriented to person, place, and time. She appears well-developed and well-nourished. No distress.  Cardiovascular: Normal rate, regular rhythm, normal heart sounds and intact distal pulses.     Pulmonary/Chest: Effort normal and breath sounds normal. No respiratory distress.  Abdominal: Soft. Bowel sounds are normal. She exhibits no distension and no mass. There is no tenderness.  Musculoskeletal: Normal range of motion.  Neurological: She is alert and oriented to person, place, and time.  Skin: Skin is warm and dry.  Psychiatric: She has a normal mood and affect.    Labs reviewed: Basic Metabolic Panel:  Recent Labs  04/02/13 1054 10/04/13 0952  NA 140 139  K 4.2 4.2  CL 103 102  CO2 24 24  GLUCOSE 84 84  BUN 12 12  CREATININE 0.76 0.82  CALCIUM 8.6 8.2*  TSH 0.179* 0.057*   Liver Function Tests:  Recent Labs  04/02/13 1054 10/04/13 0952  AST 18 21  ALT 17 21  ALKPHOS 34* 35*  BILITOT 0.5 0.4  PROT 6.3 6.1  CBC:  Recent Labs  04/02/13 1054 10/04/13 0952  WBC 4.1 4.0  NEUTROABS 2.1 2.0  HGB 12.7 12.6  HCT 36.6 37.2  MCV 90 91   Lipid Panel:  Recent Labs  10/04/13 0952  HDL 60  LDLCALC 71  TRIG 110  CHOLHDL 2.6   Assessment/Plan 1. Breast cancer of upper-outer quadrant of left female breast -no evidence of recurrence, but has abnormality on right breast during diagnostic mammo so next one is 6 mos from april -cont tamoxifen as planned for at least 5 years  2. Osteoarthritis (arthritis due to wear and tear of joints) -of hands primarily  3. Symptomatic menopausal or female climacteric states -continues with hot flashes related to tamoxifen  4. Hypothyroid -cont synthroid, but reduced dose to 153mcg from 155mcg  5. Need for vaccination with 13-polyvalent pneumococcal conjugate vaccine -will need prevnar next time  6. Need for Zostavax administration -given script for shingles vaccine  Labs/tests ordered: Orders Placed This Encounter  Procedures  . TSH    Standing Status: Future     Number of Occurrences:      Standing Expiration Date: 10/08/2014  . Basic metabolic panel    Standing Status: Future     Number of Occurrences:       Standing Expiration Date: 10/08/2014    Next appt:  6 mos for annual exam and prevnar

## 2013-10-13 ENCOUNTER — Other Ambulatory Visit: Payer: Self-pay | Admitting: Oncology

## 2013-10-15 ENCOUNTER — Encounter: Payer: Self-pay | Admitting: Internal Medicine

## 2014-01-02 ENCOUNTER — Other Ambulatory Visit: Payer: Self-pay | Admitting: Internal Medicine

## 2014-01-18 ENCOUNTER — Ambulatory Visit: Payer: Medicare Other

## 2014-01-18 ENCOUNTER — Ambulatory Visit (HOSPITAL_BASED_OUTPATIENT_CLINIC_OR_DEPARTMENT_OTHER): Payer: Medicare Other | Admitting: Oncology

## 2014-01-18 ENCOUNTER — Other Ambulatory Visit (HOSPITAL_BASED_OUTPATIENT_CLINIC_OR_DEPARTMENT_OTHER): Payer: Medicare Other

## 2014-01-18 ENCOUNTER — Other Ambulatory Visit: Payer: Self-pay | Admitting: *Deleted

## 2014-01-18 ENCOUNTER — Telehealth: Payer: Self-pay | Admitting: Oncology

## 2014-01-18 VITALS — BP 120/65 | HR 64 | Temp 98.1°F | Resp 18 | Ht 62.5 in | Wt 153.1 lb

## 2014-01-18 DIAGNOSIS — C50412 Malignant neoplasm of upper-outer quadrant of left female breast: Secondary | ICD-10-CM

## 2014-01-18 DIAGNOSIS — M949 Disorder of cartilage, unspecified: Secondary | ICD-10-CM | POA: Diagnosis not present

## 2014-01-18 DIAGNOSIS — R61 Generalized hyperhidrosis: Secondary | ICD-10-CM

## 2014-01-18 DIAGNOSIS — D0590 Unspecified type of carcinoma in situ of unspecified breast: Secondary | ICD-10-CM

## 2014-01-18 DIAGNOSIS — Z170 Estrogen receptor positive status [ER+]: Secondary | ICD-10-CM | POA: Diagnosis not present

## 2014-01-18 DIAGNOSIS — R928 Other abnormal and inconclusive findings on diagnostic imaging of breast: Secondary | ICD-10-CM | POA: Diagnosis not present

## 2014-01-18 DIAGNOSIS — M899 Disorder of bone, unspecified: Secondary | ICD-10-CM | POA: Diagnosis not present

## 2014-01-18 LAB — CBC WITH DIFFERENTIAL/PLATELET
BASO%: 0.6 % (ref 0.0–2.0)
Basophils Absolute: 0 10*3/uL (ref 0.0–0.1)
EOS%: 1.4 % (ref 0.0–7.0)
Eosinophils Absolute: 0.1 10*3/uL (ref 0.0–0.5)
HCT: 39.8 % (ref 34.8–46.6)
HEMOGLOBIN: 13 g/dL (ref 11.6–15.9)
LYMPH%: 35.5 % (ref 14.0–49.7)
MCH: 30.9 pg (ref 25.1–34.0)
MCHC: 32.7 g/dL (ref 31.5–36.0)
MCV: 94.5 fL (ref 79.5–101.0)
MONO#: 0.4 10*3/uL (ref 0.1–0.9)
MONO%: 7.9 % (ref 0.0–14.0)
NEUT%: 54.6 % (ref 38.4–76.8)
NEUTROS ABS: 2.6 10*3/uL (ref 1.5–6.5)
Platelets: 196 10*3/uL (ref 145–400)
RBC: 4.21 10*6/uL (ref 3.70–5.45)
RDW: 13.2 % (ref 11.2–14.5)
WBC: 4.8 10*3/uL (ref 3.9–10.3)
lymph#: 1.7 10*3/uL (ref 0.9–3.3)

## 2014-01-18 LAB — COMPREHENSIVE METABOLIC PANEL (CC13)
ALK PHOS: 37 U/L — AB (ref 40–150)
ALT: 17 U/L (ref 0–55)
AST: 19 U/L (ref 5–34)
Albumin: 3.6 g/dL (ref 3.5–5.0)
Anion Gap: 5 mEq/L (ref 3–11)
BILIRUBIN TOTAL: 0.33 mg/dL (ref 0.20–1.20)
BUN: 11.3 mg/dL (ref 7.0–26.0)
CO2: 29 mEq/L (ref 22–29)
CREATININE: 0.9 mg/dL (ref 0.6–1.1)
Calcium: 8.8 mg/dL (ref 8.4–10.4)
Chloride: 107 mEq/L (ref 98–109)
GLUCOSE: 98 mg/dL (ref 70–140)
Potassium: 4.3 mEq/L (ref 3.5–5.1)
SODIUM: 141 meq/L (ref 136–145)
TOTAL PROTEIN: 6.9 g/dL (ref 6.4–8.3)

## 2014-01-18 MED ORDER — GABAPENTIN 300 MG PO CAPS: 300.0000 mg | ORAL_CAPSULE | Freq: Every day | ORAL | Status: DC

## 2014-01-18 NOTE — Telephone Encounter (Signed)
per pof to sch pt appt-sch & gave pt copy of sch °

## 2014-01-18 NOTE — Progress Notes (Signed)
ID: Sylvia [name]   DOB: 06-08-1945  MR#: [id number]  WUJ#:[id number]  PCP: Hollace Kinnier, DO GYN: Dr. Tracie Harrier SU: Dr. Donne Hazel OTHER MD:   CHIEF COMPLAINT: Ductal carcinoma in situ  CURRENT TREATMENT: Tamoxifen   BREAST CANCER HISTORY: From the original intake note:   68 year old [place] woman, who had a screening mammogram on 06/19/2011, which showed a 1 cm mass lateral aspect of the left breast. Ultrasound confirmed the presence of a 1.3 x 1 x 0.8 cm lobulated well-circumscribed mass at 3:00 4 cm from the nipple. 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 HER-2 was negative the ratio of 1.03. Bilateral MRI scan performed 07/01/2011 showed bilateral multinodular parenchymal enhancement with a 2 x 1.5 x 1.5 cm irregular enhancing mass left breast at the 2:00 position with corresponding to the recently biopsied mass. Initial enhancing mass was seen in the upper-outer quadrant of the left breast with a second look ultrasound recommended. In addition, in the right breast a 1.3 cm area of nodular clumped linear enhancement was located in the superior subareolar portion of the right breast. A subsequent biopsy of the right breast on 07/02/11 showed an intraductal papilloma with microcalcifications.  On 07/26/11, she underwent a left and right lumpectomy with left axillary sentinel lymph node biopsy by Dr. Donne Hazel.  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  Two examined lymph nodes were negative.  She was started on Tamoxifen in April of 2013.  Her subsequent history is as detailed below   INTERVAL HISTORY:   Solymar returns today for followup of her noninvasive breast cancer. She is on tamoxifen with good tolerance except for hot flashes. These occur both at night and in the daytime.  They are associated with sweating. They do wake her up. She tried. In C. for these but he gave her diarrhea. She tried venlafaxine but it nauseated her.   REVIEW OF SYSTEMS:  aside from the hot flashes she is doing fine. There is mild sinus problems. She has the usual joint pains here in there which are not more persistent or intense than before. She walks her dog at least twice a day. That is her only form of exercise. A detailed review of systems today was otherwise noncontributory   PAST MEDICAL HISTORY: Past Medical History  Diagnosis Date  . Hypothyroid   . S/P bilateral breast biopsy 2013  . Complication of anesthesia     slow to wake up  . Normal nuclear stress test     [facility], 07/18/2011  . Shortness of breath     /w housework  . Reflux esophagitis     pt. knowledgeable about dietary intake relative to reflux  . Breast cancer of upper-outer quadrant of left female breast     left breast cancer  . Arthritis     OA- fingers, toes, back pain, hip pain   . Symptomatic menopausal or female climacteric states   . Malignant neoplasm of breast (female), unspecified site   . Pain in joint, pelvic region and thigh   . Disorder of bone and cartilage, unspecified   . Lumbago   . Aphasia   . Osteoarthritis (arthritis due to wear and tear of joints)     PAST SURGICAL HISTORY: Past Surgical History  Procedure Laterality Date  . Cataract extraction  2012  . Dilation and curettage  of uterus  1978  . Eye surgery      R cat. extracted /w IOL  . Tonsillectomy      as a child  . Ectopic pregnancy surgery      1980  . Breast surgery      Procedure: BREAST BIOPSY WITH NEEDLE LOCALIZATION;  Sur  . Breast biopsy  07/26/2011    Procedure: BREAST BIOPSY WITH NEEDLE LOCALIZATION;  Surgeon: Rolm Bookbinder, MD;  Location: [facility];  Service: General;  Laterality: Right;  Right breast wire guided biospy Needle localization @ [facility]  7:30    FAMILY HISTORY Family History  Problem Relation  Age of Onset  . Cancer Sister     colon  . Cancer Brother     renal  . Heart disease Brother   . Cancer Maternal Aunt     breast  . Cancer Cousin   . Cancer Cousin   . Anesthesia problems Neg Hx   . Cancer Mother     leukemia  . Cancer Father     lung  . 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  She lives with her husband.  Her daughter lives next door to her.    ADVANCED DIRECTIVES:  Living will and Healthcare POA  HEALTH MAINTENANCE: History  Substance Use Topics  . Smoking status: Former Smoker -- 0.25 packs/day    Quit date: 07/22/1980  . Smokeless tobacco: Never Used  . Alcohol Use: No    Mammogram: 07/29/11  Colonoscopy: 04/2012  PAP:  03/10/12  Bone density:  07/09/11 with low bone mass  Lipid panel:  Managed by Dr. Mariea Clonts  Allergies  Allergen Reactions  . Benadryl [Diphenhydramine Hcl] Other (See Comments)    "Super hyper"  . Tape     Skin breakdown/redness   . Boniva [Ibandronate Sodium] Other (See Comments)    Neck, shoulder limited mobility   . Chlorhexidine Gluconate   . Peridin-C [Ascorbic Acid]   . Venlafaxine   . Codeine Other (See Comments)    "can't wake up from it."    Current Outpatient Prescriptions  Medication Sig Dispense Refill  . Calcium Carbonate-Vitamin D (CALCIUM + D PO) Take 600 mg by mouth 2 (two) times daily.      . cholecalciferol (VITAMIN D) 1000 UNITS tablet Take 1,000 Units by mouth 2 (two) times daily.      Marland Kitchen diptheria-tetanus toxoids (DECAVAC) 2-2 LF/0.5ML injection Inject 0.5 mLs into the muscle once.  0.5 mL  0  . ibuprofen (ADVIL,MOTRIN) 100 MG tablet Take 400 mg by mouth every 6 (six) hours as needed. For pain      . SYNTHROID 100 MCG tablet TAKE ONE TABLET BY MOUTH ONCE DAILY BEFORE BREAKFAST  90 tablet  0  . tamoxifen (NOLVADEX) 20 MG tablet TAKE ONE  TABLET BY MOUTH ONCE DAILY  30 tablet  3  . vitamin E 400 UNIT capsule Take 400 Units by mouth 2 (two) times daily.      . VOLTAREN 1 % GEL Ad lib.      Marland Kitchen zoster vaccine live, PF, (ZOSTAVAX) 57846 UNT/0.65ML injection Inject 19,400 Units into the skin once.  1 each  0  . [DISCONTINUED] Calcium Carbonate-Vit D-Min (CALTRATE 600+D PLUS) 600-400 MG-UNIT per tablet Take 2 tablets by mouth daily.  No current facility-administered medications for this visit.    OBJECTIVE: Filed Vitals:   01/18/14 1445  BP: 120/65  Pulse: 64  Temp: 98.1 F (36.7 C)  Resp: 18     Body mass index is 27.54 kg/(m^2).    ECOG FS:  Symptomatic but completely ambulatory  General: Well developed, well nourished female in no acute distress. Alert and oriented x 3.  Head/ Neck: Oropharynx clear.  Sclerae anicteric.  Supple without any enlargements.  Lymph node survey: No cervical, supraclavicular, or axillary adenopathy.  Cardiovascular: Regular rate and rhythm. S1 and S2 normal.  Lungs: Clear to auscultation bilaterally. No wheezes/crackles/rhonchi noted.  Skin: No rashes or lesions present. Back: No CVA tenderness.  Abdomen: Abdomen soft, non-tender and obese. Active bowel sounds in all quadrants. No evidence of a fluid wave or abdominal masses.  Breasts:  Inspection negative with no nodularity, masses, erythema, or drainage noted bilaterally. Extremities: No bilateral cyanosis, edema, or clubbing.   LAB RESULTS: Lab Results  Component Value Date   WBC 4.8 01/18/2014   NEUTROABS 2.6 01/18/2014   HGB 13.0 01/18/2014   HCT 39.8 01/18/2014   MCV 94.5 01/18/2014   PLT 196 01/18/2014      Chemistry      Component Value Date/Time   NA 141 01/18/2014 1421   NA 139 10/04/2013 0952   NA 141 11/27/2011 1309   K 4.3 01/18/2014 1421   K 4.2 10/04/2013 0952   CL 102 10/04/2013 0952   CL 107 06/24/2012 1207   CO2 29 01/18/2014 1421   CO2 24 10/04/2013 0952   BUN 11.3 01/18/2014 1421   BUN 12 10/04/2013 0952   BUN 10  11/27/2011 1309   CREATININE 0.9 01/18/2014 1421   CREATININE 0.82 10/04/2013 0952      Component Value Date/Time   CALCIUM 8.8 01/18/2014 1421   CALCIUM 8.2* 10/04/2013 0952   ALKPHOS 37* 01/18/2014 1421   ALKPHOS 35* 10/04/2013 0952   AST 19 01/18/2014 1421   AST 21 10/04/2013 0952   ALT 17 01/18/2014 1421   ALT 21 10/04/2013 0952   BILITOT 0.33 01/18/2014 1421   BILITOT 0.4 10/04/2013 0952       Lab Results  Component Value Date   LABCA2 10 07/03/2011    No components found with this basename: [id number]    No results found for this basename: INR,  in the last 168 hours  Urinalysis No results found for this basename: colorurine,  appearanceur,  labspec,  phurine,  glucoseu,  hgbur,  bilirubinur,  ketonesur,  proteinur,  urobilinogen,  nitrite,  leukocytesur    STUDIES: Mammography at [facility] for 01/16/2014 showed an indeterminate 5 mm oval nodule in the right breast.  she is scheduled for 6 month followup in a week  ASSESSMENT: 68 y.o. [place] woman: #1  S/P right and left lumpectomy by Dr. Donne Hazel on 07/26/11 for Tis N0 DCIS, low grade, Stage 0, ER 62%, PR 0%, Ki67 12%, HER2 - with the left breast and an intraductal papilloma with the right breast.  #2  She was started on Tamoxifen in April of 2013.  #3 osteopenia with a T score of -1.8 on DEXA scan at [facility] 08/05/2013.  PLAN:  She has tolerated the tamoxifen well except for the hot flashes. I think she would do better if she took gabapentin at bedtime. We discussed the possible toxicities, side effects and complications of that agent and it turns out her husband is already taking it for his own  problems namely nighttime cramps.  She will let me know if she has any side effects from the gabapentin but I expect it will help her sleep better. For the daytime hot flashes we usually use venlafaxine but she tolerated that poorly (nausea). She feels she can "live with" the daytime hot flashes so we are not going to go to Catapres or similar  agents at this point.  She has heard some were stories of people going off tamoxifen and immediately having metastatic breast cancer. I reassured her that her cancer was not invasive and therefore cannot do that. However if she wishes to continue tamoxifen for total of 10 years, even though we don't generally do that for noninvasive breast cancer, I would be comfortable with that option, particularly since she has osteopenia and tamoxifen can be helpful in terms of bone density issues.  She will see me again in one year. She knows to call for any problems that may develop before then. Ebonique Hallstrom C    01/18/2014

## 2014-01-24 ENCOUNTER — Encounter: Payer: Self-pay | Admitting: Internal Medicine

## 2014-01-27 ENCOUNTER — Encounter: Payer: Self-pay | Admitting: *Deleted

## 2014-01-27 DIAGNOSIS — R928 Other abnormal and inconclusive findings on diagnostic imaging of breast: Secondary | ICD-10-CM | POA: Diagnosis not present

## 2014-01-27 DIAGNOSIS — Z09 Encounter for follow-up examination after completed treatment for conditions other than malignant neoplasm: Secondary | ICD-10-CM | POA: Diagnosis not present

## 2014-01-27 DIAGNOSIS — N6459 Other signs and symptoms in breast: Secondary | ICD-10-CM | POA: Diagnosis not present

## 2014-01-27 LAB — HM MAMMOGRAPHY

## 2014-02-03 ENCOUNTER — Encounter: Payer: Self-pay | Admitting: Internal Medicine

## 2014-02-07 ENCOUNTER — Other Ambulatory Visit: Payer: Self-pay | Admitting: Oncology

## 2014-02-07 DIAGNOSIS — C50412 Malignant neoplasm of upper-outer quadrant of left female breast: Secondary | ICD-10-CM

## 2014-02-22 ENCOUNTER — Other Ambulatory Visit: Payer: Self-pay | Admitting: *Deleted

## 2014-02-22 ENCOUNTER — Encounter: Payer: Self-pay | Admitting: Oncology

## 2014-02-22 DIAGNOSIS — C50412 Malignant neoplasm of upper-outer quadrant of left female breast: Secondary | ICD-10-CM

## 2014-02-22 MED ORDER — GABAPENTIN 100 MG PO CAPS: 100.0000 mg | ORAL_CAPSULE | Freq: Every day | ORAL | Status: DC

## 2014-02-28 ENCOUNTER — Encounter: Payer: Self-pay | Admitting: Internal Medicine

## 2014-03-18 DIAGNOSIS — Z23 Encounter for immunization: Secondary | ICD-10-CM | POA: Diagnosis not present

## 2014-04-03 ENCOUNTER — Other Ambulatory Visit: Payer: Self-pay | Admitting: Internal Medicine

## 2014-04-08 ENCOUNTER — Ambulatory Visit (INDEPENDENT_AMBULATORY_CARE_PROVIDER_SITE_OTHER): Payer: Medicare Other | Admitting: Internal Medicine

## 2014-04-08 ENCOUNTER — Encounter: Payer: Self-pay | Admitting: Internal Medicine

## 2014-04-08 VITALS — BP 138/72 | HR 68 | Temp 98.3°F | Resp 12 | Ht 62.0 in | Wt 152.4 lb

## 2014-04-08 DIAGNOSIS — M150 Primary generalized (osteo)arthritis: Secondary | ICD-10-CM | POA: Diagnosis not present

## 2014-04-08 DIAGNOSIS — N951 Menopausal and female climacteric states: Secondary | ICD-10-CM | POA: Diagnosis not present

## 2014-04-08 DIAGNOSIS — C50412 Malignant neoplasm of upper-outer quadrant of left female breast: Secondary | ICD-10-CM

## 2014-04-08 DIAGNOSIS — E039 Hypothyroidism, unspecified: Secondary | ICD-10-CM | POA: Diagnosis not present

## 2014-04-08 DIAGNOSIS — Z23 Encounter for immunization: Secondary | ICD-10-CM

## 2014-04-08 DIAGNOSIS — Z8249 Family history of ischemic heart disease and other diseases of the circulatory system: Secondary | ICD-10-CM | POA: Diagnosis not present

## 2014-04-08 DIAGNOSIS — M159 Polyosteoarthritis, unspecified: Secondary | ICD-10-CM

## 2014-04-08 DIAGNOSIS — M8949 Other hypertrophic osteoarthropathy, multiple sites: Secondary | ICD-10-CM

## 2014-04-08 NOTE — Addendum Note (Signed)
Addended by: Jearld Adjutant on: 04/08/2014 09:47 AM   Modules accepted: Orders

## 2014-04-08 NOTE — Progress Notes (Signed)
Patient ID: Sylvia [name], female   DOB: Jun 17, 1945, 68 y.o.   MRN: [id number]   Location:  [facility]  Code Status: DNR  Allergies  Allergen Reactions  . Benadryl [Diphenhydramine Hcl] Other (See Comments)    "Super hyper"  . Tape     Skin breakdown/redness   . Boniva [Ibandronate Sodium] Other (See Comments)    Neck, shoulder limited mobility   . Chlorhexidine Gluconate   . Peridin-C [Ascorbic Acid]   . Venlafaxine   . Codeine Other (See Comments)    "can't wake up from it."    Chief Complaint  Patient presents with  . Annual Exam    Yearly check-up, fasting for any labs due. Pap completed by GYN 2014- Normal. MMSE 29/30, passed clock drawing   . Advice Only    Discuss Hep C testing for patient's age group   . Results    Discuss genetic test results    HPI: Patient is a 68 y.o.  seen in the office today for annual exam.   MMSE 29/30 and passed clock today Had pap at gyn. 03/10/14 Fasting for labs. Wants hep c testing if needed--she .   Had flu vaccine 03/18/14 Had pneumococcal 23 09/28/12 Needs prevnar today tdap 04/15/13 Cscope was 03/30/12 Mammo 02/22/14 Bone density 08/05/13 No falls and not depressed Zostavax was 01/24/14 Asked if she should have  Was started on gabapentin 100mg  at hs for her hot flashes.  Has been very helpful.  Is able to sleep all night.  Review of Systems:  Review of Systems  Constitutional: Negative for fever and chills.  HENT: Negative for congestion.   Eyes: Positive for blurred vision.       Left eye--cataract due for excision-has appt; also has dry eyes  Respiratory: Negative for shortness of breath.   Cardiovascular: Negative for chest pain.  Gastrointestinal: Negative for abdominal pain, constipation, blood in stool and melena.  Genitourinary: Negative for dysuria, urgency and frequency.  Musculoskeletal: Positive for joint pain. Negative for falls.  Skin: Negative for rash.    Neurological: Negative for dizziness.  Endo/Heme/Allergies:       No longer having hot flashes  Psychiatric/Behavioral: Negative for depression and memory loss. The patient does not have insomnia.      Past Medical History  Diagnosis Date  . Hypothyroid   . S/P bilateral breast biopsy 2013  . Complication of anesthesia     slow to wake up  . Normal nuclear stress test     [facility], 07/18/2011  . Shortness of breath     /w housework  . Reflux esophagitis     pt. knowledgeable about dietary intake relative to reflux  . Breast cancer of upper-outer quadrant of left female breast     left breast cancer  . Arthritis     OA- fingers, toes, back pain, hip pain   . Symptomatic menopausal or female climacteric states   . Malignant neoplasm of breast (female), unspecified site   . Pain in joint, pelvic region and thigh   . Disorder of bone and cartilage, unspecified   . Lumbago   . Aphasia   . Osteoarthritis (arthritis due to wear and tear of joints)     Past Surgical History  Procedure Laterality Date  . Cataract extraction  2012  . Dilation and curettage of uterus  1978  . Eye surgery      R cat. extracted /w IOL  . Tonsillectomy  as a child  . Ectopic pregnancy surgery      1980  . Breast surgery      Procedure: BREAST BIOPSY WITH NEEDLE LOCALIZATION;  Sur  . Breast biopsy  07/26/2011    Procedure: BREAST BIOPSY WITH NEEDLE LOCALIZATION;  Surgeon: Rolm Bookbinder, MD;  Location: [place];  Service: General;  Laterality: Right;  Right breast wire guided biospy Needle localization @ [facility]  7:30    Social History:   reports that she quit smoking about 33 years ago. She has never used smokeless tobacco. She reports that she does not drink alcohol or use illicit drugs.  Family History  Problem Relation Age of Onset  . Cancer Sister     colon  . Cancer Brother     renal  . Heart disease Brother   . Cancer Maternal Aunt     breast  . Cancer Cousin   . Cancer  Cousin   . Anesthesia problems Neg Hx   . Cancer Mother     leukemia  . Cancer Father     lung  . Heart attack Brother     Medications: Patient's Medications  New Prescriptions   No medications on file  Previous Medications   CALCIUM CARBONATE-VITAMIN D (CALCIUM + D PO)    Take 600 mg by mouth 2 (two) times daily.   CHOLECALCIFEROL (VITAMIN D) 1000 UNITS TABLET    Take 1,000 Units by mouth 2 (two) times daily.   GABAPENTIN (NEURONTIN) 100 MG CAPSULE    Take 1-2 capsules (100-200 mg total) by mouth at bedtime.   IBUPROFEN (ADVIL,MOTRIN) 100 MG TABLET    Take 400 mg by mouth every 6 (six) hours as needed. For pain   SYNTHROID 100 MCG TABLET    TAKE ONE TABLET BY MOUTH ONCE DAILY BEFORE BREAKFAST   TAMOXIFEN (NOLVADEX) 20 MG TABLET    TAKE ONE TABLET BY MOUTH ONCE DAILY   VITAMIN E 400 UNIT CAPSULE    Take 400 Units by mouth 2 (two) times daily.   VOLTAREN 1 % GEL    Ad lib.  Modified Medications   No medications on file  Discontinued Medications   DIPTHERIA-TETANUS TOXOIDS (DECAVAC) 2-2 LF/0.5ML INJECTION    Inject 0.5 mLs into the muscle once.   ZOSTER VACCINE LIVE, PF, (ZOSTAVAX) 16109 UNT/0.65ML INJECTION    Inject 19,400 Units into the skin once.   Physical Exam: Filed Vitals:   04/08/14 0842  BP: 138/72  Pulse: 68  Temp: 98.3 F (36.8 C)  TempSrc: Oral  Resp: 12  Height: 5\' 2"  (1.575 m)  Weight: 152 lb 6.4 oz (69.128 kg)  SpO2: 99%  Physical Exam  Constitutional: She is oriented to person, place, and time. She appears well-developed and well-nourished. No distress.  HENT:  Head: Normocephalic and atraumatic.  Right Ear: External ear normal.  Left Ear: External ear normal.  Nose: Nose normal.  Mouth/Throat: Oropharynx is clear and moist. No oropharyngeal exudate.  Eyes: Conjunctivae and EOM are normal. Pupils are equal, round, and reactive to light. No scleral icterus.  Neck: Normal range of motion. Neck supple. No JVD present.  Cardiovascular: Normal rate, regular  rhythm, normal heart sounds and intact distal pulses.   Pulmonary/Chest: Effort normal and breath sounds normal. No respiratory distress.  Breast exam done at gyn   Abdominal: Soft. Bowel sounds are normal. She exhibits no distension and no mass. There is no tenderness.  Genitourinary:  Pelvic exam at gyn  Musculoskeletal: Normal range of motion.  She exhibits no edema or tenderness.  Neurological: She is alert and oriented to person, place, and time. She has normal reflexes.  Skin: Skin is warm and dry.  Psychiatric: She has a normal mood and affect.     Labs reviewed: Basic Metabolic Panel:  Recent Labs  10/04/13 0952 01/18/14 1421  NA 139 141  K 4.2 4.3  CL 102  --   CO2 24 29  GLUCOSE 84 98  BUN 12 11.3  CREATININE 0.82 0.9  CALCIUM 8.2* 8.8  TSH 0.057*  --    Liver Function Tests:  Recent Labs  10/04/13 0952 01/18/14 1421  AST 21 19  ALT 21 17  ALKPHOS 35* 37*  BILITOT 0.4 0.33  PROT 6.1 6.9  ALBUMIN  --  3.6   No results for input(s): LIPASE, AMYLASE in the last 8760 hours. No results for input(s): AMMONIA in the last 8760 hours. CBC:  Recent Labs  10/04/13 0952 01/18/14 1421  WBC 4.0 4.8  NEUTROABS 2.0 2.6  HGB 12.6 13.0  HCT 37.2 39.8  MCV 91 94.5  PLT  --  196   Lipid Panel:  Recent Labs  10/04/13 0952  HDL 60  LDLCALC 71  TRIG 110  CHOLHDL 2.6  Assessment/Plan 1. Hypothyroidism, unspecified hypothyroidism type -had reduced her synthroid at last visit, will recheck tsh today and she will get just enough pills to last through the weekend  2. Breast cancer of upper-outer quadrant of left female breast -in remission  3. Primary osteoarthritis involving multiple joints -stable, no complaints today, rare use of ibuprofen or tylenol  4. Family history of carotid artery stenosis -she asks about screening for this due to her mom's severe PAD - US Carotid Bilateral; Future  5. Symptomatic menopausal or female climacteric  states -resolved with low dose gabapentin  6. Need for vaccination with 13-polyvalent pneumococcal conjugate vaccine -prevnar given  7. Family history of coronary artery disease - EKG 12-Lead done today  Labs/tests ordered:  Get bmp, tsh today ("future" orders) Next appt:  6 mos  Jenisis Harmsen L. Tawney Vanorman, D.O. [facility] [address], [place] [address] Cell Phone (Mon-Fri 8am-5pm):  [phone] On Call:  [phone] & follow prompts after 5pm & weekends Office Phone:  [phone] Office Fax:  [phone]

## 2014-04-08 NOTE — Addendum Note (Signed)
Addended by: Logan Bores on: 04/08/2014 09:45 AM   Modules accepted: Orders

## 2014-04-08 NOTE — Progress Notes (Signed)
Passed clock drawing 

## 2014-04-09 LAB — BASIC METABOLIC PANEL
BUN/Creatinine Ratio: 11 (ref 11–26)
BUN: 10 mg/dL (ref 8–27)
CO2: 24 mmol/L (ref 18–29)
Calcium: 8.3 mg/dL — ABNORMAL LOW (ref 8.7–10.3)
Chloride: 108 mmol/L (ref 97–108)
Creatinine, Ser: 0.89 mg/dL (ref 0.57–1.00)
GFR calc Af Amer: 76 mL/min/{1.73_m2} (ref >59)
GFR calc non Af Amer: 66 mL/min/{1.73_m2} (ref >59)
Glucose: 83 mg/dL (ref 65–99)
Potassium: 4.5 mmol/L (ref 3.5–5.2)
Sodium: 143 mmol/L (ref 134–144)

## 2014-04-09 LAB — TSH: TSH: 0.239 u[IU]/mL — ABNORMAL LOW (ref 0.450–4.500)

## 2014-04-11 ENCOUNTER — Encounter: Payer: Self-pay | Admitting: Internal Medicine

## 2014-04-12 ENCOUNTER — Telehealth: Payer: Self-pay | Admitting: *Deleted

## 2014-04-12 ENCOUNTER — Other Ambulatory Visit: Payer: Self-pay | Admitting: *Deleted

## 2014-04-12 MED ORDER — LEVOTHYROXINE SODIUM 112 MCG PO TABS: 112.0000 ug | ORAL_TABLET | Freq: Every day | ORAL | Status: DC

## 2014-04-12 NOTE — Telephone Encounter (Signed)
Sylvia [name] with [name] called and stated that the Carotid Screening you ordered is not covered by the insurance. They do offer a Vascu Screening, but patient has to pay up front $50.00 for just the Carotid screening and $135.00 for all checked. Patient does have to pay up front for the testing. Please Advise.

## 2014-04-12 NOTE — Addendum Note (Signed)
Addended by: Logan Bores on: 04/12/2014 10:09 AM   Modules accepted: Orders

## 2014-04-12 NOTE — Telephone Encounter (Signed)
-----   Message from Gayland Curry, DO sent at 04/11/2014  9:38 AM EST ----- TSH is still low.  We need to reduce her synthroid to 175mcg daily

## 2014-04-12 NOTE — Telephone Encounter (Signed)
Called patient to give lab results on her thyroid test, she states that she understood that her medication has been increased and would pick it up later this afternoon.

## 2014-04-13 ENCOUNTER — Telehealth: Payer: Self-pay | Admitting: Internal Medicine

## 2014-04-13 NOTE — Telephone Encounter (Signed)
Sylvia [name] from [facility] called in regards to the Carotid test that was ordered. Sylvia [name] stated that this test will not be covered by insurance and that for the Carotid Test it would be $50.00 or she can have a Vascular Screening which is 3 tests that includes the carotid for $125. Patient would have to pay out of pocket at the visit for these tests.  I called the patient to discuss and she said she wants to hold off on this test for now and will discuss this with Dr. Mariea Clonts at her next appointment. (Patients next appointment is in June) [place] back and let her know the patient wants to cancel appointment.

## 2014-04-13 NOTE — Telephone Encounter (Signed)
Please inform the patient of the need to pay for these up front.  She had requested this testing be done due to her family history.  She has no symptoms and is generally healthy.

## 2014-04-15 NOTE — Telephone Encounter (Signed)
Patient stated she has already spoke with the Cardiology office and she canceled test due to cost. She will discuss with Dr. Mariea Clonts at Appointment.

## 2014-04-18 ENCOUNTER — Encounter (HOSPITAL_COMMUNITY): Payer: Medicare Other

## 2014-04-23 ENCOUNTER — Other Ambulatory Visit: Payer: Self-pay | Admitting: Oncology

## 2014-04-23 DIAGNOSIS — C50412 Malignant neoplasm of upper-outer quadrant of left female breast: Secondary | ICD-10-CM

## 2014-08-05 ENCOUNTER — Telehealth: Payer: Self-pay | Admitting: Oncology

## 2014-08-05 NOTE — Telephone Encounter (Signed)
Due to PAL moved 9/22 lab and 9/29 f/u to 10/20 and 10/27. Left message for patient and mailed schedule. Pt also mychart active.

## 2014-08-22 ENCOUNTER — Encounter: Payer: Self-pay | Admitting: Internal Medicine

## 2014-10-04 DIAGNOSIS — H2512 Age-related nuclear cataract, left eye: Secondary | ICD-10-CM | POA: Diagnosis not present

## 2014-10-10 ENCOUNTER — Encounter: Payer: Self-pay | Admitting: Internal Medicine

## 2014-10-10 ENCOUNTER — Ambulatory Visit (INDEPENDENT_AMBULATORY_CARE_PROVIDER_SITE_OTHER): Payer: Medicare Other | Admitting: Internal Medicine

## 2014-10-10 VITALS — BP 120/76 | HR 74 | Temp 98.1°F | Resp 20 | Ht 62.0 in | Wt 149.4 lb

## 2014-10-10 DIAGNOSIS — M150 Primary generalized (osteo)arthritis: Secondary | ICD-10-CM | POA: Diagnosis not present

## 2014-10-10 DIAGNOSIS — Z8249 Family history of ischemic heart disease and other diseases of the circulatory system: Secondary | ICD-10-CM | POA: Diagnosis not present

## 2014-10-10 DIAGNOSIS — C50412 Malignant neoplasm of upper-outer quadrant of left female breast: Secondary | ICD-10-CM

## 2014-10-10 DIAGNOSIS — M159 Polyosteoarthritis, unspecified: Secondary | ICD-10-CM

## 2014-10-10 DIAGNOSIS — E039 Hypothyroidism, unspecified: Secondary | ICD-10-CM | POA: Diagnosis not present

## 2014-10-10 DIAGNOSIS — M8580 Other specified disorders of bone density and structure, unspecified site: Secondary | ICD-10-CM

## 2014-10-10 DIAGNOSIS — T451X5A Adverse effect of antineoplastic and immunosuppressive drugs, initial encounter: Secondary | ICD-10-CM

## 2014-10-10 DIAGNOSIS — R232 Flushing: Secondary | ICD-10-CM

## 2014-10-10 DIAGNOSIS — M8949 Other hypertrophic osteoarthropathy, multiple sites: Secondary | ICD-10-CM

## 2014-10-10 NOTE — Progress Notes (Signed)
Patient ID: Sylvia [name], female   DOB: July 11, 1945, 69 y.o.   MRN: [id number]   Location:  [facility]   Goals of Care: Advanced Directive information Does patient have an advance directive?: Yes, Type of Advance Directive: Living will   Chief Complaint  Patient presents with  . Medical Management of Chronic Issues    6 month follow-up,talk about getting generic for synthroid    HPI: Patient is a 69 y.o. white female seen in the office today for med mgt of chronic diseases.  She is doing well and has no new concerns.  Sees Dr. Jana Hakim next in October.   Discussed synthroid.  Decided to keep with brand b/c we've had trouble regulating even with it.  I wonder if her breast cancer and hormonal changes haven't also altered her thyroid?    Lately her hips are giving her a fit on occasion.  Some days good and some days, no pain whatsoever.  She is still able to walk the dog and do the stairs. Doesn't use voltaren gel or ibuprofen much at all.   Gabapentin still works to help her sleep for hot flashes just a period of three wks, then holds it for a bit, then restarts.  Goes up 2 lbs when she's on it.    Medicare would not cover carotid duplex for fh/o carotid disease.  Review of Systems:  Review of Systems  Constitutional: Negative for fever and chills.  HENT: Negative for hearing loss.   Eyes: Negative for blurred vision.  Respiratory: Negative for shortness of breath.   Cardiovascular: Negative for chest pain and leg swelling.  Gastrointestinal: Negative for abdominal pain, constipation, blood in stool and melena.  Genitourinary: Negative for dysuria, urgency and frequency.  Musculoskeletal: Positive for back pain and joint pain. Negative for falls.  Skin: Negative for itching and rash.  Endo/Heme/Allergies:       Hot flashes when doesn't take gabapentin  Psychiatric/Behavioral: Negative for depression and memory loss.    Past  Medical History  Diagnosis Date  . Hypothyroid   . S/P bilateral breast biopsy 2013  . Complication of anesthesia     slow to wake up  . Normal nuclear stress test     [facility], 07/18/2011  . Shortness of breath     /w housework  . Reflux esophagitis     pt. knowledgeable about dietary intake relative to reflux  . Breast cancer of upper-outer quadrant of left female breast     left breast cancer  . Arthritis     OA- fingers, toes, back pain, hip pain   . Symptomatic menopausal or female climacteric states   . Malignant neoplasm of breast (female), unspecified site   . Pain in joint, pelvic region and thigh   . Disorder of bone and cartilage, unspecified   . Lumbago   . Aphasia   . Osteoarthritis (arthritis due to wear and tear of joints)     Past Surgical History  Procedure Laterality Date  . Cataract extraction  2012  . Dilation and curettage of uterus  1978  . Eye surgery      R cat. extracted /w IOL  . Tonsillectomy      as a child  . Ectopic pregnancy surgery      1980  . Breast surgery      Procedure: BREAST BIOPSY WITH NEEDLE LOCALIZATION;  Sur  . Breast biopsy  07/26/2011    Procedure: BREAST  BIOPSY WITH NEEDLE LOCALIZATION;  Surgeon: Rolm Bookbinder, MD;  Location: [place];  Service: General;  Laterality: Right;  Right breast wire guided biospy Needle localization @ [facility]  7:30    Allergies  Allergen Reactions  . Benadryl [Diphenhydramine Hcl] Other (See Comments)    "Super hyper"  . Tape     Skin breakdown/redness   . Boniva [Ibandronate Sodium] Other (See Comments)    Neck, shoulder limited mobility   . Chlorhexidine Gluconate   . Peridin-C [Ascorbic Acid]   . Venlafaxine   . Codeine Other (See Comments)    "can't wake up from it."   Medications: Patient's Medications  New Prescriptions   No medications on file  Previous Medications   CALCIUM CARBONATE-VITAMIN D (CALCIUM + D PO)    Take 600 mg by mouth 2 (two) times daily.   CHOLECALCIFEROL  (VITAMIN D) 1000 UNITS TABLET    Take 1,000 Units by mouth 2 (two) times daily.   GABAPENTIN (NEURONTIN) 100 MG CAPSULE    TAKE ONE TO TWO CAPSULES BY MOUTH AT BEDTIME   IBUPROFEN (ADVIL,MOTRIN) 100 MG TABLET    Take 400 mg by mouth every 6 (six) hours as needed. For pain   LEVOTHYROXINE (SYNTHROID, LEVOTHROID) 112 MCG TABLET    Take 1 tablet (112 mcg total) by mouth daily.   TAMOXIFEN (NOLVADEX) 20 MG TABLET    TAKE ONE TABLET BY MOUTH ONCE DAILY   VITAMIN E 400 UNIT CAPSULE    Take 400 Units by mouth 2 (two) times daily.   VOLTAREN 1 % GEL    Ad lib.  Modified Medications   No medications on file  Discontinued Medications   No medications on file    Physical Exam: Filed Vitals:   10/10/14 1013  BP: 120/76  Pulse: 74  Temp: 98.1 F (36.7 C)  TempSrc: Oral  Resp: 20  Height: 5\' 2"  (1.575 m)  Weight: 149 lb 6.4 oz (67.767 kg)  SpO2: 97%   Physical Exam  Constitutional: She is oriented to person, place, and time. She appears well-developed and well-nourished. No distress.  Cardiovascular: Normal rate, regular rhythm, normal heart sounds and intact distal pulses.   Pulmonary/Chest: Effort normal and breath sounds normal. No respiratory distress.  Abdominal: Bowel sounds are normal.  Musculoskeletal: Normal range of motion. She exhibits no tenderness.  Neurological: She is alert and oriented to person, place, and time.  Skin: Skin is warm and dry.    Labs reviewed: Basic Metabolic Panel:  Recent Labs  01/18/14 1421 04/08/14 0947  NA 141 143  K 4.3 4.5  CL  --  108  CO2 29 24  GLUCOSE 98 83  BUN 11.3 10  CREATININE 0.9 0.89  CALCIUM 8.8 8.3*  TSH  --  0.239*   Liver Function Tests:  Recent Labs  01/18/14 1421  AST 19  ALT 17  ALKPHOS 37*  BILITOT 0.33  PROT 6.9  ALBUMIN 3.6   No results for input(s): LIPASE, AMYLASE in the last 8760 hours. No results for input(s): AMMONIA in the last 8760 hours. CBC:  Recent Labs  01/18/14 1421  WBC 4.8  NEUTROABS  2.6  HGB 13.0  HCT 39.8  MCV 94.5  PLT 196   Assessment/Plan 1. Hypothyroidism, unspecified hypothyroidism type - cont current synthroid until we receive results -is taking first thing on empty stomach as directed - TSH  2. Primary osteoarthritis involving multiple joints -cont as needed medications, denies really using these and is still functioning well  despite back and hip arthritis pains -does not want additional interventions at present and will let me know if she does  3. Family history of carotid artery stenosis -medicare would not cover screening doppler she requested, fortunately she had not bruits on exam last physical  4. Breast cancer of upper-outer quadrant of left female breast -has f/u with Dr. Jana Hakim in October -had mammo in December of last year -no new concerns -continues tamoxifen for this   5.  Hot flashes due to tamoxifen -cont on gabapentin for this -holds it occasionally due to some slight weight gain when taking  6.  Osteopenia: -cont ca with D--is taking separately from synthroid  Labs/tests ordered:  Orders Placed This Encounter  Procedures  . TSH   Next appt:  6 mos for annual exam  Bentleigh Waren L. Georgenia Salim, D.O. [facility] [address], [place] [address] Cell Phone (Mon-Fri 8am-5pm):  [phone] On Call:  [phone] & follow prompts after 5pm & weekends Office Phone:  [phone] Office Fax:  [phone]

## 2014-10-11 ENCOUNTER — Telehealth: Payer: Self-pay

## 2014-10-11 DIAGNOSIS — E039 Hypothyroidism, unspecified: Secondary | ICD-10-CM

## 2014-10-11 LAB — TSH: TSH: 0.021 u[IU]/mL — ABNORMAL LOW (ref 0.450–4.500)

## 2014-10-11 MED ORDER — LEVOTHYROXINE SODIUM 100 MCG PO TABS: ORAL_TABLET | ORAL | Status: DC

## 2014-10-11 NOTE — Telephone Encounter (Signed)
Spoke with patient about labs, fax Rx to [facility], return 11/22/14 for labs

## 2014-11-22 ENCOUNTER — Other Ambulatory Visit: Payer: Medicare Other

## 2014-11-22 DIAGNOSIS — E039 Hypothyroidism, unspecified: Secondary | ICD-10-CM

## 2014-11-23 ENCOUNTER — Telehealth: Payer: Self-pay

## 2014-11-23 ENCOUNTER — Encounter: Payer: Self-pay | Admitting: Internal Medicine

## 2014-11-23 LAB — TSH: TSH: 0.072 u[IU]/mL — ABNORMAL LOW (ref 0.450–4.500)

## 2014-11-23 MED ORDER — LEVOTHYROXINE SODIUM 75 MCG PO TABS: 75.0000 ug | ORAL_TABLET | Freq: Every day | ORAL | Status: DC

## 2014-11-23 NOTE — Telephone Encounter (Signed)
Discussed results with patient, patient verbalized understanding of results. RX sent to pharmacy  

## 2014-11-23 NOTE — Telephone Encounter (Signed)
-----   Message from Gayland Curry, DO sent at 11/23/2014  8:49 AM EDT ----- Let's decrease her synthroid (brand name) to 61mcg daily first thing in the morning on an empty stomach separate from all food and meds.

## 2014-11-23 NOTE — Telephone Encounter (Signed)
Free T3 and Free T4 added. [facility] sheet add on filled out and given to [facility] for Specimen number and account. Faxed to [place].

## 2014-11-23 NOTE — Telephone Encounter (Signed)
-----   Message from Gayland Curry, DO sent at 11/23/2014 12:31 PM EDT ----- Let's add free T3 and free T4 to Kathelyn's labs for hypothyroidism.

## 2014-11-24 LAB — SPECIMEN STATUS REPORT

## 2014-11-24 LAB — T3, FREE: T3, Free: 2.8 pg/mL (ref 2.0–4.4)

## 2014-11-24 LAB — T4, FREE: Free T4: 1.4 ng/dL (ref 0.82–1.77)

## 2014-11-25 ENCOUNTER — Other Ambulatory Visit: Payer: Self-pay | Admitting: *Deleted

## 2014-11-25 DIAGNOSIS — E041 Nontoxic single thyroid nodule: Secondary | ICD-10-CM

## 2014-11-25 NOTE — Patient Instructions (Signed)
Patient was called and informed that Dr. Mariea Clonts requested that she have a ultrasound of her thyroid nodule. I informed her that someone will call her to schedule an appointment.

## 2014-12-01 ENCOUNTER — Ambulatory Visit
Admission: RE | Admit: 2014-12-01 | Discharge: 2014-12-01 | Disposition: A | Discharge status: Home / Self Care | Payer: Medicare Other | Source: Ambulatory Visit | Attending: Internal Medicine | Admitting: Internal Medicine

## 2014-12-01 DIAGNOSIS — Z853 Personal history of malignant neoplasm of breast: Secondary | ICD-10-CM | POA: Diagnosis not present

## 2014-12-01 DIAGNOSIS — E041 Nontoxic single thyroid nodule: Secondary | ICD-10-CM

## 2015-01-19 ENCOUNTER — Other Ambulatory Visit: Payer: Medicare Other

## 2015-01-24 ENCOUNTER — Telehealth: Payer: Self-pay | Admitting: Oncology

## 2015-01-24 NOTE — Telephone Encounter (Signed)
Confirmed appointment change due to MD on call from 10/27 to 10/20.

## 2015-01-26 ENCOUNTER — Ambulatory Visit: Payer: Medicare Other | Admitting: Oncology

## 2015-02-01 ENCOUNTER — Encounter: Payer: Self-pay | Admitting: *Deleted

## 2015-02-01 DIAGNOSIS — Z853 Personal history of malignant neoplasm of breast: Secondary | ICD-10-CM | POA: Diagnosis not present

## 2015-02-01 LAB — HM MAMMOGRAPHY: HM Mammogram: NORMAL

## 2015-02-15 ENCOUNTER — Other Ambulatory Visit: Payer: Self-pay | Admitting: *Deleted

## 2015-02-15 DIAGNOSIS — C50412 Malignant neoplasm of upper-outer quadrant of left female breast: Secondary | ICD-10-CM

## 2015-02-16 ENCOUNTER — Telehealth: Payer: Self-pay | Admitting: Oncology

## 2015-02-16 ENCOUNTER — Ambulatory Visit (HOSPITAL_BASED_OUTPATIENT_CLINIC_OR_DEPARTMENT_OTHER): Payer: Medicare Other | Admitting: Nurse Practitioner

## 2015-02-16 ENCOUNTER — Encounter: Payer: Self-pay | Admitting: Nurse Practitioner

## 2015-02-16 ENCOUNTER — Other Ambulatory Visit: Payer: Medicare Other

## 2015-02-16 ENCOUNTER — Other Ambulatory Visit: Payer: Self-pay | Admitting: *Deleted

## 2015-02-16 ENCOUNTER — Other Ambulatory Visit (HOSPITAL_BASED_OUTPATIENT_CLINIC_OR_DEPARTMENT_OTHER): Payer: Medicare Other

## 2015-02-16 VITALS — BP 117/69 | HR 76 | Temp 99.1°F | Resp 18 | Ht 62.0 in | Wt 151.5 lb

## 2015-02-16 DIAGNOSIS — D0511 Intraductal carcinoma in situ of right breast: Secondary | ICD-10-CM

## 2015-02-16 DIAGNOSIS — M8580 Other specified disorders of bone density and structure, unspecified site: Secondary | ICD-10-CM | POA: Diagnosis not present

## 2015-02-16 DIAGNOSIS — C50412 Malignant neoplasm of upper-outer quadrant of left female breast: Secondary | ICD-10-CM

## 2015-02-16 LAB — COMPREHENSIVE METABOLIC PANEL (CC13)
ALBUMIN: 3.8 g/dL (ref 3.5–5.0)
ALK PHOS: 39 U/L — AB (ref 40–150)
ALT: 25 U/L (ref 0–55)
AST: 23 U/L (ref 5–34)
Anion Gap: 5 mEq/L (ref 3–11)
BUN: 10.7 mg/dL (ref 7.0–26.0)
CALCIUM: 9.1 mg/dL (ref 8.4–10.4)
CO2: 28 mEq/L (ref 22–29)
Chloride: 108 mEq/L (ref 98–109)
Creatinine: 0.9 mg/dL (ref 0.6–1.1)
EGFR: 68 mL/min/{1.73_m2} — AB (ref >90)
Glucose: 87 mg/dl (ref 70–140)
POTASSIUM: 3.8 meq/L (ref 3.5–5.1)
SODIUM: 141 meq/L (ref 136–145)
Total Bilirubin: 0.3 mg/dL (ref 0.20–1.20)
Total Protein: 6.9 g/dL (ref 6.4–8.3)

## 2015-02-16 LAB — CBC WITH DIFFERENTIAL/PLATELET
BASO%: 0.7 % (ref 0.0–2.0)
Basophils Absolute: 0 10*3/uL (ref 0.0–0.1)
EOS ABS: 0.1 10*3/uL (ref 0.0–0.5)
EOS%: 1.5 % (ref 0.0–7.0)
HEMATOCRIT: 40.2 % (ref 34.8–46.6)
HEMOGLOBIN: 13.5 g/dL (ref 11.6–15.9)
LYMPH#: 2 10*3/uL (ref 0.9–3.3)
LYMPH%: 38.3 % (ref 14.0–49.7)
MCH: 31.6 pg (ref 25.1–34.0)
MCHC: 33.7 g/dL (ref 31.5–36.0)
MCV: 94 fL (ref 79.5–101.0)
MONO#: 0.4 10*3/uL (ref 0.1–0.9)
MONO%: 7 % (ref 0.0–14.0)
NEUT%: 52.5 % (ref 38.4–76.8)
NEUTROS ABS: 2.7 10*3/uL (ref 1.5–6.5)
Platelets: 209 10*3/uL (ref 145–400)
RBC: 4.28 10*6/uL (ref 3.70–5.45)
RDW: 13.2 % (ref 11.2–14.5)
WBC: 5.1 10*3/uL (ref 3.9–10.3)

## 2015-02-16 MED ORDER — TAMOXIFEN CITRATE 20 MG PO TABS: 20.0000 mg | ORAL_TABLET | Freq: Every day | ORAL | Status: DC

## 2015-02-16 MED ORDER — GABAPENTIN 100 MG PO CAPS: ORAL_CAPSULE | ORAL | Status: DC

## 2015-02-16 NOTE — Telephone Encounter (Signed)
Gave patient avs report and appointments for October 2017. Per HB one year GM/lab - will send pof.

## 2015-02-16 NOTE — [facility] Notes (Signed)
ID: Sylvia [name]   DOB: 1946/04/14  MR#: [id number]  PQZ#:[id number]  PCP: Hollace Kinnier, DO GYN: Dr. Tracie Harrier SU: Dr. Donne Hazel OTHER MD:  CHIEF COMPLAINT: Ductal carcinoma in situ  CURRENT TREATMENT: Tamoxifen  BREAST CANCER HISTORY: From the original intake note:   69 year old [place] woman, who had a screening mammogram on 06/19/2011, which showed a 1 cm mass lateral aspect of the left breast. Ultrasound confirmed the presence of a 1.3 x 1 x 0.8 cm lobulated well-circumscribed mass at 3:00 4 cm from the nipple. 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 HER-2 was negative the ratio of 1.03. Bilateral MRI scan performed 07/01/2011 showed bilateral multinodular parenchymal enhancement with a 2 x 1.5 x 1.5 cm irregular enhancing mass left breast at the 2:00 position with corresponding to the recently biopsied mass. Initial enhancing mass was seen in the upper-outer quadrant of the left breast with a second look ultrasound recommended. In addition, in the right breast a 1.3 cm area of nodular clumped linear enhancement was located in the superior subareolar portion of the right breast. A subsequent biopsy of the right breast on 07/02/11 showed an intraductal papilloma with microcalcifications.  On 07/26/11, she underwent a left and right lumpectomy with left axillary sentinel lymph node biopsy by Dr. Donne Hazel.  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  Two examined lymph nodes were negative.  She was started on Tamoxifen in April of 2013.  Her subsequent history is as detailed below   INTERVAL HISTORY:  Sylvia [name] returns today for follow up of her noninvasive breast cancer. She has been on tamoxifen since April 2013 and tolerates this well besides hot flashes. She takes gabapentin QHS which  helps her get through the night flashes without waking up, but they are fairly consistent during the day. She prefers to manage these on her own. She has some vaginal dryness, but denies increase arthralgias/myalgias. The interval history is generally unremarkable. She walks her dogs for exercise.    REVIEW OF SYSTEMS: Sylvia [name] has no complaints to offer. A detailed review of systems is otherwise entirely negative, except where noted above.  PAST MEDICAL HISTORY: Past Medical History  Diagnosis Date  . Hypothyroid   . S/P bilateral breast biopsy 2013  . Complication of anesthesia     slow to wake up  . Normal nuclear stress test     [facility], 07/18/2011  . Shortness of breath     /w housework  . Reflux esophagitis     pt. knowledgeable about dietary intake relative to reflux  . Breast cancer of upper-outer quadrant of left female breast     left breast cancer  . Arthritis     OA- fingers, toes, back pain, hip pain   . Symptomatic menopausal or female climacteric states   . Malignant neoplasm of breast (female), unspecified site   . Pain in joint, pelvic region and thigh   . Disorder of bone and cartilage, unspecified   . Lumbago   . Aphasia   . Osteoarthritis (arthritis due to wear and tear of joints)     PAST SURGICAL HISTORY: Past Surgical History  Procedure Laterality Date  . Cataract extraction  2012  . Dilation and curettage of uterus  1978  . Eye surgery      R cat. extracted /w IOL  . Tonsillectomy  as a child  . Ectopic pregnancy surgery      1980  . Breast surgery      Procedure: BREAST BIOPSY WITH NEEDLE LOCALIZATION;  Sur  . Breast biopsy  07/26/2011    Procedure: BREAST BIOPSY WITH NEEDLE LOCALIZATION;  Surgeon: Rolm Bookbinder, MD;  Location: [place];  Service: General;  Laterality: Right;  Right breast wire guided biospy Needle localization @ [facility]  7:30    FAMILY HISTORY Family History  Problem Relation Age of Onset  . Cancer Sister     colon   . Cancer Brother     renal  . Heart disease Brother   . Cancer Maternal Aunt     breast  . Cancer Cousin   . Cancer Cousin   . Anesthesia problems Neg Hx   . Cancer Mother     leukemia  . Cancer Father     lung  . 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  She lives with her husband.  Her daughter lives next door to her.    ADVANCED DIRECTIVES:  Living will and Healthcare POA  HEALTH MAINTENANCE: Social History  Substance Use Topics  . Smoking status: Former Smoker -- 0.25 packs/day    Quit date: 07/22/1980  . Smokeless tobacco: Never Used  . Alcohol Use: No    Mammogram: 07/29/11  Colonoscopy: 04/2012  PAP:  03/10/12  Bone density:  07/09/11 with low bone mass  Lipid panel:  Managed by Dr. Mariea Clonts  Allergies  Allergen Reactions  . Benadryl [Diphenhydramine Hcl] Other (See Comments)    "Super hyper"  . Tape     Skin breakdown/redness   . Boniva [Ibandronate Sodium] Other (See Comments)    Neck, shoulder limited mobility   . Chlorhexidine Gluconate   . Peridin-C [Ascorbic Acid]   . Venlafaxine   . Codeine Other (See Comments)    "can't wake up from it."    Current Outpatient Prescriptions  Medication Sig Dispense Refill  . Calcium Carbonate-Vitamin D (CALCIUM + D PO) Take 600 mg by mouth 2 (two) times daily.    . cholecalciferol (VITAMIN D) 1000 UNITS tablet Take 1,000 Units by mouth 2 (two) times daily.    Marland Kitchen levothyroxine (SYNTHROID) 75 MCG tablet Take 1 tablet (75 mcg total) by mouth daily before breakfast. Brand Name Only 30 tablet 2  . vitamin E 400 UNIT capsule Take 400 Units by mouth 2 (two) times daily.    . VOLTAREN 1 % GEL Ad lib.    Marland Kitchen gabapentin (NEURONTIN) 100 MG capsule TAKE ONE TO TWO CAPSULES BY MOUTH AT BEDTIME 60 capsule 5  . ibuprofen (ADVIL,MOTRIN) 100 MG tablet Take 400 mg by  mouth every 6 (six) hours as needed. For pain    . tamoxifen (NOLVADEX) 20 MG tablet Take 1 tablet (20 mg total) by mouth daily. 30 tablet 6  . [DISCONTINUED] Calcium Carbonate-Vit D-Min (CALTRATE 600+D PLUS) 600-400 MG-UNIT per tablet Take 2 tablets by mouth daily.     No current facility-administered medications for this visit.    OBJECTIVE: Filed Vitals:   02/16/15 1426  BP: 117/69  Pulse: 76  Temp: 99.1 F (37.3 C)  Resp: 18     Body mass index is 27.7 kg/(m^2).    ECOG FS:  Symptomatic but completely ambulatory  Skin: warm, dry  HEENT: sclerae anicteric, conjunctivae pink, oropharynx clear. No thrush or mucositis.  Lymph Nodes: No cervical or supraclavicular lymphadenopathy  Lungs: clear to auscultation bilaterally, no rales, wheezes, or rhonci  Heart: regular rate and rhythm  Abdomen: round, soft, non tender, positive bowel sounds  Musculoskeletal: No focal spinal tenderness, no peripheral edema  Neuro: non focal, well oriented, positive affect  Breasts: bilateral breast status post lumpectomy.No evidence of recurrent disease. Bilateral axillae benign  LAB RESULTS: Lab Results  Component Value Date   WBC 5.1 02/16/2015   NEUTROABS 2.7 02/16/2015   HGB 13.5 02/16/2015   HCT 40.2 02/16/2015   MCV 94.0 02/16/2015   PLT 209 02/16/2015      Chemistry      Component Value Date/Time   NA 141 02/16/2015 1414   NA 143 04/08/2014 0947   NA 141 11/27/2011 1309   K 3.8 02/16/2015 1414   K 4.5 04/08/2014 0947   CL 108 04/08/2014 0947   CL 107 06/24/2012 1207   CO2 28 02/16/2015 1414   CO2 24 04/08/2014 0947   BUN 10.7 02/16/2015 1414   BUN 10 04/08/2014 0947   BUN 10 11/27/2011 1309   CREATININE 0.9 02/16/2015 1414   CREATININE 0.89 04/08/2014 0947      Component Value Date/Time   CALCIUM 9.1 02/16/2015 1414   CALCIUM 8.3* 04/08/2014 0947   ALKPHOS 39* 02/16/2015 1414   ALKPHOS 35* 10/04/2013 0952   AST 23 02/16/2015 1414   AST 21 10/04/2013 0952   ALT 25  02/16/2015 1414   ALT 21 10/04/2013 0952   BILITOT 0.30 02/16/2015 1414   BILITOT 0.4 10/04/2013 0952       Lab Results  Component Value Date   LABCA2 10 07/03/2011    No components found for: [id number]  No results for input(s): INR in the last 168 hours.  Urinalysis No results found for: COLORURINE  STUDIES: [facility]ammography at [facility] for 02/01/15 unremarkable.  ASSESSMENT: 69 y.o. [place] woman: #1  S/P right and left lumpectomy by Dr. Donne Hazel on 07/26/11 for Tis N0 DCIS, low grade, Stage 0, ER 62%, PR 0%, Ki67 12%, HER2 - with the left breast and an intraductal papilloma with the right breast.  #2  She was started on Tamoxifen in April of 2013.  #3 osteopenia with a T score of -1.8 on DEXA scan at [facility] 08/05/2013.  PLAN:  Sintia is doing well as far as her breast cancer is concerned. She is now 3 years out from her definitive surgery with no evidence of recurrent disease. The labs were reviewed in detail and were stable. Her most recent mammogram was benign. She is tolerating the tamoxifen well and will continue this drug for at least 5 years of antiestrogen therapy.   Evelette will return in 1 year for labs and a follow up visit. She understands and agrees with this plan. She knows the goal of treatment in her case is cure. She has been encouraged to call with any issues that might arise before her next visit here.  Sylvia [name]    02/16/2015

## 2015-02-19 ENCOUNTER — Other Ambulatory Visit: Payer: Self-pay | Admitting: Internal Medicine

## 2015-02-21 DIAGNOSIS — Z23 Encounter for immunization: Secondary | ICD-10-CM | POA: Diagnosis not present

## 2015-02-23 ENCOUNTER — Ambulatory Visit: Payer: Medicare Other | Admitting: Oncology

## 2015-03-06 LAB — HM PAP SMEAR: HM Pap smear: NORMAL

## 2015-03-16 DIAGNOSIS — Z6827 Body mass index (BMI) 27.0-27.9, adult: Secondary | ICD-10-CM | POA: Diagnosis not present

## 2015-03-16 DIAGNOSIS — Z124 Encounter for screening for malignant neoplasm of cervix: Secondary | ICD-10-CM | POA: Diagnosis not present

## 2015-03-16 DIAGNOSIS — Z01411 Encounter for gynecological examination (general) (routine) with abnormal findings: Secondary | ICD-10-CM | POA: Diagnosis not present

## 2015-04-13 ENCOUNTER — Ambulatory Visit (INDEPENDENT_AMBULATORY_CARE_PROVIDER_SITE_OTHER): Payer: Medicare Other | Admitting: Internal Medicine

## 2015-04-13 ENCOUNTER — Encounter: Payer: Self-pay | Admitting: Internal Medicine

## 2015-04-13 VITALS — BP 112/80 | HR 85 | Temp 98.3°F | Resp 20 | Ht 62.0 in | Wt 152.2 lb

## 2015-04-13 DIAGNOSIS — R002 Palpitations: Secondary | ICD-10-CM | POA: Diagnosis not present

## 2015-04-13 DIAGNOSIS — Z0000 Encounter for general adult medical examination without abnormal findings: Secondary | ICD-10-CM

## 2015-04-13 DIAGNOSIS — E039 Hypothyroidism, unspecified: Secondary | ICD-10-CM | POA: Diagnosis not present

## 2015-04-13 NOTE — Progress Notes (Signed)
Patient ID: Sylvia [name], female   DOB: October 01, 1945, 69 y.o.   MRN: [id number]   Location: [facility]  Provider: Rexene Edison. Mariea Clonts, D.O., C.M.D.  Code Status: DNR Goals of Care: Advanced Directive information Does patient have an advance directive?: Yes, Type of Advance Directive: [place], Does patient want to make changes to advanced directive?: No - Patient declined  Chief Complaint  Patient presents with  . Annual Exam  . Acute Visit     having bowel issues    HPI: Patient is a 69 y.o. female seen in the office today for an annual wellness exam.   [employer] screening showed some "skipped beats" in 8/16.  She reports occasionally feeling this.  It is never persistent.  She was not in afib then.  Last ekg 12/15 was unremarkable in sinus rhythm. Increased stress with husband having prostate cancer diagnosis and dog being ill.    Bowel concerns:  Some difficulty with constipation in sept.  Oct early on could not go.  Did not feel bloated.  Took a laxative.  Worked overnight, cleaned her out.  Went 4-5 days, then had hard small pieces of stool.  Started with prunes.  Dr. Raphael Gibney suggested miralax during her gyn visit.  No findings during her rectal exam there.   2013 last cscope.  Diet and meds have been the same.  Does not drink much water.  Never has.  Does well with fiber intake.  No melena or hematochezia.  No pain.  Weight is steady.  Goes down off gabapentin, then back up when on it.  Can't sleep without it now.  Still on tamoxifen.  Has family history of colon cancer.   Depression screen [facility] 2/9 04/13/2015 04/08/2014 10/07/2013 09/28/2012  Decreased Interest 0 0 0 0  Down, Depressed, Hopeless 0 0 0 0  PHQ - 2 Score 0 0 0 0    Fall Risk  04/13/2015 10/10/2014 04/08/2014 10/07/2013 09/28/2012  Falls in the past year? No No No No No   MMSE - Mini Mental State Exam 04/13/2015 04/08/2014  Orientation to time 5 5  Orientation to Place 5 5  Registration 3 3    Attention/ Calculation 5 5  Recall 3 2  Language- name 2 objects 2 2  Language- repeat 1 1  Language- follow 3 step command 3 3  Language- read & follow direction 1 1  Write a sentence 1 1  Copy design 1 1  Total score 30 29   Health Maintenance  Topic Date Due  . Hepatitis C Screening  02-Jan-1946  . INFLUENZA VACCINE  11/28/2015  . MAMMOGRAM  01/31/2017  . COLONOSCOPY  03/23/2022  . TETANUS/TDAP  04/16/2023  . DEXA SCAN  Completed  . ZOSTAVAX  Completed  . PNA vac Low Risk Adult  Completed   Urinary incontinence?  No difficulty. Functional status?  Independent in ADLs Exercise?  Walks the dog 3-4x per day and up and down the steps 7-8x.  No formal exercise.   Diet?  Not following a special diet, gets plenty of fiber.  Avoids sodas, avoids adding sugar in tea.  Otherwise drinks water with meals. Vision:  Knows she needs another cataract removed in the new year. Hearing:  Ok except if it's loud or has difficulty understanding what is said.  Needs hearing test, but putting off.   Dentition:  No dental problems. Pain:  No pain.    Review of Systems:  Review of Systems  Constitutional:  Negative for malaise/fatigue.  HENT: Positive for hearing loss.   Eyes: Negative for blurred vision.       Does have cataract  Respiratory: Negative for shortness of breath.   Cardiovascular: Negative for chest pain and leg swelling.  Gastrointestinal: Positive for constipation. Negative for abdominal pain, diarrhea, blood in stool and melena.  Genitourinary: Negative for dysuria and urgency.  Musculoskeletal: Negative for falls.  Skin:       Spots on her back she asks about today  Neurological: Negative for dizziness and loss of consciousness.  Endo/Heme/Allergies: Does not bruise/bleed easily.       Night hot flashes  Psychiatric/Behavioral: Negative for depression and memory loss.       Stressed    Past Medical History  Diagnosis Date  . Hypothyroid   . S/P bilateral breast biopsy  2013  . Complication of anesthesia     slow to wake up  . Normal nuclear stress test     [facility], 07/18/2011  . Shortness of breath     /w housework  . Reflux esophagitis     pt. knowledgeable about dietary intake relative to reflux  . Breast cancer of upper-outer quadrant of left female breast ([place])     left breast cancer  . Arthritis     OA- fingers, toes, back pain, hip pain   . Symptomatic menopausal or female climacteric states   . Malignant neoplasm of breast (female), unspecified site   . Pain in joint, pelvic region and thigh   . Disorder of bone and cartilage, unspecified   . Lumbago   . Aphasia   . Osteoarthritis (arthritis due to wear and tear of joints)     Past Surgical History  Procedure Laterality Date  . Cataract extraction  2012  . Dilation and curettage of uterus  1978  . Eye surgery      R cat. extracted /w IOL  . Tonsillectomy      as a child  . Ectopic pregnancy surgery      1980  . Breast surgery      Procedure: BREAST BIOPSY WITH NEEDLE LOCALIZATION;  Sur  . Breast biopsy  07/26/2011    Procedure: BREAST BIOPSY WITH NEEDLE LOCALIZATION;  Surgeon: Rolm Bookbinder, MD;  Location: [place];  Service: General;  Laterality: Right;  Right breast wire guided biospy Needle localization @ [facility]  7:30    Allergies  Allergen Reactions  . Benadryl [Diphenhydramine Hcl] Other (See Comments)    "Super hyper"  . Tape     Skin breakdown/redness   . Boniva [Ibandronate Sodium] Other (See Comments)    Neck, shoulder limited mobility   . Chlorhexidine Gluconate   . Peridin-C [Ascorbic Acid]   . Venlafaxine   . Codeine Other (See Comments)    "can't wake up from it."      Medication List       This list is accurate as of: 04/13/15  2:09 PM.  Always use your most recent med list.               CALCIUM + D PO  Take 600 mg by mouth 2 (two) times daily.     cholecalciferol 1000 UNITS tablet  Commonly known as:  VITAMIN D  Take 1,000 Units by mouth  2 (two) times daily.     gabapentin 100 MG capsule  Commonly known as:  NEURONTIN  TAKE ONE TO TWO CAPSULES BY MOUTH AT BEDTIME     ibuprofen 100  MG tablet  Commonly known as:  ADVIL,MOTRIN  Take 400 mg by mouth every 6 (six) hours as needed. For pain     SYNTHROID 75 MCG tablet  Generic drug:  levothyroxine  TAKE ONE TABLET BY MOUTH ONCE DAILY BEFORE  BREAKFAST     tamoxifen 20 MG tablet  Commonly known as:  NOLVADEX  Take 1 tablet (20 mg total) by mouth daily.     vitamin E 400 UNIT capsule  Take 400 Units by mouth 2 (two) times daily.     VOLTAREN 1 % Gel  Generic drug:  diclofenac sodium  Ad lib.        Physical Exam: Filed Vitals:   04/13/15 1342  BP: 112/80  Pulse: 85  Temp: 98.3 F (36.8 C)  TempSrc: Oral  Resp: 20  Height: 5\' 2"  (1.575 m)  Weight: 152 lb 3.2 oz (69.037 kg)  SpO2: 98%   Body mass index is 27.83 kg/(m^2). Physical Exam  Constitutional: She is oriented to person, place, and time. She appears well-developed and well-nourished. No distress.  HENT:  Head: Normocephalic and atraumatic.  Right Ear: External ear normal.  Left Ear: External ear normal.  Nose: Nose normal.  Mouth/Throat: Oropharynx is clear and moist. No oropharyngeal exudate.  Eyes: Conjunctivae and EOM are normal. Pupils are equal, round, and reactive to light.  Neck: Normal range of motion. Neck supple. No JVD present. No thyromegaly present.  Cardiovascular: Normal rate, regular rhythm, normal heart sounds and intact distal pulses.   One pvc  Pulmonary/Chest: Effort normal and breath sounds normal. No respiratory distress.  Abdominal: Soft. Bowel sounds are normal. She exhibits no distension and no mass. There is no tenderness.  Genitourinary:  Pt goes to GYN for pelvic, pap, breast exam, rectal exam  Musculoskeletal: Normal range of motion.  Lymphadenopathy:    She has no cervical adenopathy.  Neurological: She is alert and oriented to person, place, and time. She has  normal reflexes. No cranial nerve deficit.  Skin: Skin is warm and dry.  Psychiatric: She has a normal mood and affect. Her behavior is normal. Judgment and thought content normal.    Labs reviewed: Basic Metabolic Panel:  Recent Labs  10/10/14 1126 11/22/14 0836 02/16/15 1414  NA  --   --  141  K  --   --  3.8  CO2  --   --  28  GLUCOSE  --   --  87  BUN  --   --  10.7  CREATININE  --   --  0.9  CALCIUM  --   --  9.1  TSH 0.021* 0.072*  --    Liver Function Tests:  Recent Labs  02/16/15 1414  AST 23  ALT 25  ALKPHOS 39*  BILITOT 0.30  PROT 6.9  ALBUMIN 3.8   No results for input(s): LIPASE, AMYLASE in the last 8760 hours. No results for input(s): AMMONIA in the last 8760 hours. CBC:  Recent Labs  02/16/15 1414  WBC 5.1  NEUTROABS 2.7  HGB 13.5  HCT 40.2  MCV 94.0  PLT 209   Assessment/Plan 1. Medicare annual wellness visit, subsequent - EKG 12-Lead - she is up to date on her preventive care  2. Palpitations - concerned about her palpitations she has rarely and pvcs seen on her lifeline screening - EKG 12-Lead was sinus rhythm, no pvcs or pacs during the strip  3. Hypothyroidism, unspecified hypothyroidism type - has felt better since her synthroid was decreased  to 82mcg daily--reassess - TSH - T3, Free - T4, Free - EKG 12-Lead  Labs/tests ordered:   Orders Placed This Encounter  Procedures  . TSH  . T3, Free  . T4, Free  . EKG 12-Lead    Order Specific Question:  Where should this test be performed    Answer:  OTHER   Next appt:  6 mos med mgt, prn  Tushar Enns L. Seaborn Nakama, D.O. [facility] [address], [place] [address] Cell Phone (Mon-Fri 8am-5pm):  [phone] On Call:  [phone] & follow prompts after 5pm & weekends Office Phone:  [phone] Office Fax:  [phone]

## 2015-04-14 LAB — T4, FREE: Free T4: 1.01 ng/dL (ref 0.82–1.77)

## 2015-04-14 LAB — T3, FREE: T3, Free: 2 pg/mL (ref 2.0–4.4)

## 2015-04-14 LAB — TSH: TSH: 5.74 u[IU]/mL — ABNORMAL HIGH (ref 0.450–4.500)

## 2015-06-05 ENCOUNTER — Encounter: Payer: Self-pay | Admitting: Oncology

## 2015-06-07 ENCOUNTER — Other Ambulatory Visit: Payer: Self-pay | Admitting: *Deleted

## 2015-06-07 DIAGNOSIS — C50412 Malignant neoplasm of upper-outer quadrant of left female breast: Secondary | ICD-10-CM

## 2015-06-07 MED ORDER — GABAPENTIN 100 MG PO CAPS: ORAL_CAPSULE | ORAL | Status: DC

## 2015-07-12 DIAGNOSIS — J0100 Acute maxillary sinusitis, unspecified: Secondary | ICD-10-CM | POA: Diagnosis not present

## 2015-07-12 DIAGNOSIS — J029 Acute pharyngitis, unspecified: Secondary | ICD-10-CM | POA: Diagnosis not present

## 2015-08-17 ENCOUNTER — Other Ambulatory Visit: Payer: Self-pay | Admitting: Internal Medicine

## 2015-08-31 ENCOUNTER — Encounter: Payer: Self-pay | Admitting: Internal Medicine

## 2015-09-04 ENCOUNTER — Encounter: Payer: Self-pay | Admitting: Internal Medicine

## 2015-09-04 ENCOUNTER — Ambulatory Visit (INDEPENDENT_AMBULATORY_CARE_PROVIDER_SITE_OTHER): Payer: Medicare Other | Admitting: Internal Medicine

## 2015-09-04 VITALS — BP 118/70 | HR 77 | Temp 98.4°F | Ht 62.0 in | Wt 153.0 lb

## 2015-09-04 DIAGNOSIS — R21 Rash and other nonspecific skin eruption: Secondary | ICD-10-CM | POA: Diagnosis not present

## 2015-09-04 MED ORDER — CLOTRIMAZOLE 1 % EX CREA: 1.0000 "application " | TOPICAL_CREAM | Freq: Two times a day (BID) | CUTANEOUS | Status: DC

## 2015-09-04 NOTE — Progress Notes (Signed)
Location:  [facility] clinic Provider: Sameera Betton L. Mariea Clonts, D.O., C.M.D.  Code Status: DNR Goals of Care:  Advanced Directives 09/04/2015  Does patient have an advance directive? No  Type of Advance Directive -  Does patient want to make changes to advanced directive? -  Copy of advanced directive(s) in chart? -  Would patient like information on creating an advanced directive? No - patient declined information     Chief Complaint  Patient presents with  . Acute Visit    rash on stomach x a few weeks    HPI: Patient is a 70 y.o. female seen today for an acute visit for rash.  She reports that she took amoxicillin on 3/15 for a URI.  She developed an open sore in her abdominal fold about 1.5 wks after she started that.  It gradually spread across her lower abdomen.  She applied neosporin w/o improvement.  Then it went from 1.5in long to all the way across her abdomen. Then redness went down and then up to her belly button and in it.  It's pruritic and warm.  It burned when she applied A&D ointment.  Epsom salts helped her along with A and D cream--made it burn, itch less, but did not take away the redness.  She also used triamcinolone cream w/o any benefit and hydrocortisone 1% cream.  She did not change detergents, soaps, creams etc otherwise.  She is suspicious of the amoxicillin, but that should be long out of her system.  Past Medical History  Diagnosis Date  . Hypothyroid   . S/P bilateral breast biopsy 2013  . Complication of anesthesia     slow to wake up  . Normal nuclear stress test     [facility], 07/18/2011  . Shortness of breath     /w housework  . Reflux esophagitis     pt. knowledgeable about dietary intake relative to reflux  . Breast cancer of upper-outer quadrant of left female breast ([place])     left breast cancer  . Arthritis     OA- fingers, toes, back pain, hip pain   . Symptomatic menopausal or female climacteric states   . Malignant neoplasm of breast (female),  unspecified site   . Pain in joint, pelvic region and thigh   . Disorder of bone and cartilage, unspecified   . Lumbago   . Aphasia   . Osteoarthritis (arthritis due to wear and tear of joints)     Past Surgical History  Procedure Laterality Date  . Cataract extraction  2012  . Dilation and curettage of uterus  1978  . Eye surgery      R cat. extracted /w IOL  . Tonsillectomy      as a child  . Ectopic pregnancy surgery      1980  . Breast surgery      Procedure: BREAST BIOPSY WITH NEEDLE LOCALIZATION;  Sur  . Breast biopsy  07/26/2011    Procedure: BREAST BIOPSY WITH NEEDLE LOCALIZATION;  Surgeon: Rolm Bookbinder, MD;  Location: [place];  Service: General;  Laterality: Right;  Right breast wire guided biospy Needle localization @ [facility]  7:30    Allergies  Allergen Reactions  . Benadryl [Diphenhydramine Hcl] Other (See Comments)    "Super hyper"  . Tape     Skin breakdown/redness   . Boniva [Ibandronate Sodium] Other (See Comments)    Neck, shoulder limited mobility   . Chlorhexidine Gluconate   . Peridin-C [Ascorbic Acid]   .  Venlafaxine   . Codeine Other (See Comments)    "can't wake up from it."      Medication List       This list is accurate as of: 09/04/15  3:36 PM.  Always use your most recent med list.               CALCIUM + D PO  Take 600 mg by mouth 2 (two) times daily.     cholecalciferol 1000 units tablet  Commonly known as:  VITAMIN D  Take 1,000 Units by mouth 2 (two) times daily.     clotrimazole 1 % cream  Commonly known as:  LOTRIMIN  Apply 1 application topically 2 (two) times daily.     gabapentin 100 MG capsule  Commonly known as:  NEURONTIN  TAKE ONE TO TWO CAPSULES BY MOUTH AT BEDTIME     ibuprofen 100 MG tablet  Commonly known as:  ADVIL,MOTRIN  Take 400 mg by mouth every 6 (six) hours as needed. For pain     SYNTHROID 75 MCG tablet  Generic drug:  levothyroxine  TAKE 1 TABLET BY MOUTH EVERY DAY BEFORE BREAKFAST      tamoxifen 20 MG tablet  Commonly known as:  NOLVADEX  Take 1 tablet (20 mg total) by mouth daily.     vitamin E 400 UNIT capsule  Take 400 Units by mouth 2 (two) times daily.     VOLTAREN 1 % Gel  Generic drug:  diclofenac sodium  Ad lib.        Review of Systems:  Review of Systems  Constitutional: Negative for fever, chills and malaise/fatigue.  Respiratory: Negative for shortness of breath.   Cardiovascular: Negative for chest pain.  Gastrointestinal: Negative for abdominal pain.  Skin: Positive for itching and rash.  Neurological: Negative for weakness.    Health Maintenance  Topic Date Due  . Hepatitis C Screening  06/04/1945  . INFLUENZA VACCINE  11/28/2015  . MAMMOGRAM  01/31/2017  . COLONOSCOPY  03/23/2022  . TETANUS/TDAP  04/16/2023  . DEXA SCAN  Completed  . ZOSTAVAX  Completed  . PNA vac Low Risk Adult  Completed    Physical Exam: Filed Vitals:   09/04/15 1130  BP: 118/70  Pulse: 77  Temp: 98.4 F (36.9 C)  TempSrc: Oral  Height: 5\' 2"  (1.575 m)  Weight: 153 lb (69.4 kg)  SpO2: 98%   Body mass index is 27.98 kg/(m^2). Physical Exam  Abdominal: Soft. Bowel sounds are normal.  Skin: Skin is warm and dry. Rash noted. There is erythema. No pallor.  Erythematous maculopapular rash from umbilicus to upper mons pubis region with excoriated area in skin fold of lower abdomen/suprapubic region, slightly raised, warm, nontender    Labs reviewed: Basic Metabolic Panel:  Recent Labs  10/10/14 1126 11/22/14 0836 02/16/15 1414 04/13/15 1450  NA  --   --  141  --   K  --   --  3.8  --   CO2  --   --  28  --   GLUCOSE  --   --  87  --   BUN  --   --  10.7  --   CREATININE  --   --  0.9  --   CALCIUM  --   --  9.1  --   TSH 0.021* 0.072*  --  5.740*   Liver Function Tests:  Recent Labs  02/16/15 1414  AST 23  ALT 25  ALKPHOS 39*  BILITOT 0.30  PROT 6.9  ALBUMIN 3.8   No results for input(s): LIPASE, AMYLASE in the last 8760 hours. No  results for input(s): AMMONIA in the last 8760 hours. CBC:  Recent Labs  02/16/15 1414  WBC 5.1  NEUTROABS 2.7  HGB 13.5  HCT 40.2  MCV 94.0  PLT 209   Lipid Panel: No results for input(s): CHOL, HDL, LDLCALC, TRIG, CHOLHDL, LDLDIRECT in the last 8760 hours. No results found for: HGBA1C  Procedures since last visit: No results found.  Assessment/Plan 1. Rash and nonspecific skin eruption - will try a fungal cream as all others tried -if this does not get better after a few days, she will send a message or call for a derm referral--dermatology PA added to care team - clotrimazole (LOTRIMIN) 1 % cream; Apply 1 application topically 2 (two) times daily.  Dispense: 30 g; Refill: 0  Labs/tests ordered:  No new Next appt:  10/12/2015  Penelope Fittro L. Kiyaan Haq, D.O. [facility] [address], [place] [address] Cell Phone (Mon-Fri 8am-5pm):  [phone] On Call:  [phone] & follow prompts after 5pm & weekends Office Phone:  [phone] Office Fax:  [phone]

## 2015-09-04 NOTE — Patient Instructions (Signed)
Call me back or send a mychart message if rash is not getting better with fungal cream (clotrimazole).  I will then refer you to a dermatologist for further evaluation.

## 2015-09-05 ENCOUNTER — Encounter: Payer: Self-pay | Admitting: Internal Medicine

## 2015-09-05 ENCOUNTER — Other Ambulatory Visit: Payer: Self-pay | Admitting: Internal Medicine

## 2015-09-05 DIAGNOSIS — R21 Rash and other nonspecific skin eruption: Secondary | ICD-10-CM

## 2015-09-06 ENCOUNTER — Encounter: Payer: Self-pay | Admitting: Internal Medicine

## 2015-09-07 DIAGNOSIS — L209 Atopic dermatitis, unspecified: Secondary | ICD-10-CM | POA: Diagnosis not present

## 2015-09-07 DIAGNOSIS — L270 Generalized skin eruption due to drugs and medicaments taken internally: Secondary | ICD-10-CM | POA: Diagnosis not present

## 2015-09-07 LAB — BASIC METABOLIC PANEL
BUN: 11 mg/dL (ref 4–21)
CREATININE: 0.8 mg/dL (ref 0.5–1.1)
GLUCOSE: 90 mg/dL
Potassium: 4 mmol/L (ref 3.4–5.3)
SODIUM: 140 mmol/L (ref 137–147)

## 2015-09-07 LAB — HEPATIC FUNCTION PANEL
ALT: 16 U/L (ref 7–35)
AST: 20 U/L (ref 13–35)
Alkaline Phosphatase: 35 U/L (ref 25–125)
Bilirubin, Total: 0.6 mg/dL

## 2015-09-07 LAB — CBC AND DIFFERENTIAL
HCT: 40 % (ref 36–46)
Hemoglobin: 13.2 g/dL (ref 12.0–16.0)
Platelets: 192 10*3/uL (ref 150–399)
WBC: 4.6 10^3/mL

## 2015-09-07 LAB — POCT ERYTHROCYTE SEDIMENTATION RATE, NON-AUTOMATED: Sed Rate: 1 mm

## 2015-09-07 LAB — TSH: TSH: 8.75 u[IU]/mL — AB (ref 0.41–5.90)

## 2015-09-18 ENCOUNTER — Telehealth: Payer: Self-pay | Admitting: *Deleted

## 2015-09-18 ENCOUNTER — Encounter: Payer: Self-pay | Admitting: *Deleted

## 2015-09-18 NOTE — Telephone Encounter (Signed)
Per Dr. Mariea Clonts " blood counts, electrolytes, liver, kidneys ok, TSH continues to trend up was 5 last check not >8, recommend increasing synthroid to 60mcg daily and recheck TSH in 6 weeks"   Spoke with patient and she just refilled the 75 mcg and would like to finish out the current rx before starting the new one, pt has appt next month and would like to discuss then, per pt she's feeling fine.

## 2015-09-19 NOTE — Telephone Encounter (Signed)
I don't recommend waiting on this, but we can check the free T3, free T4 if desired and address at the appt per her request.

## 2015-09-21 NOTE — Telephone Encounter (Signed)
Per pt she would like to recheck labs at her appt

## 2015-09-27 ENCOUNTER — Other Ambulatory Visit: Payer: Self-pay | Admitting: Nurse Practitioner

## 2015-09-28 ENCOUNTER — Other Ambulatory Visit: Payer: Self-pay | Admitting: *Deleted

## 2015-09-28 DIAGNOSIS — C50412 Malignant neoplasm of upper-outer quadrant of left female breast: Secondary | ICD-10-CM

## 2015-09-28 MED ORDER — TAMOXIFEN CITRATE 20 MG PO TABS: 20.0000 mg | ORAL_TABLET | Freq: Every day | ORAL | Status: DC

## 2015-10-09 ENCOUNTER — Encounter: Payer: Self-pay | Admitting: *Deleted

## 2015-10-12 ENCOUNTER — Ambulatory Visit (INDEPENDENT_AMBULATORY_CARE_PROVIDER_SITE_OTHER): Payer: Medicare Other | Admitting: Internal Medicine

## 2015-10-12 ENCOUNTER — Encounter: Payer: Self-pay | Admitting: Internal Medicine

## 2015-10-12 VITALS — BP 120/60 | HR 71 | Temp 98.2°F | Ht 62.0 in | Wt 154.0 lb

## 2015-10-12 DIAGNOSIS — E039 Hypothyroidism, unspecified: Secondary | ICD-10-CM | POA: Diagnosis not present

## 2015-10-12 DIAGNOSIS — Z13220 Encounter for screening for lipoid disorders: Secondary | ICD-10-CM | POA: Diagnosis not present

## 2015-10-12 DIAGNOSIS — R232 Flushing: Secondary | ICD-10-CM

## 2015-10-12 DIAGNOSIS — Z1159 Encounter for screening for other viral diseases: Secondary | ICD-10-CM | POA: Diagnosis not present

## 2015-10-12 DIAGNOSIS — T451X5A Adverse effect of antineoplastic and immunosuppressive drugs, initial encounter: Secondary | ICD-10-CM | POA: Diagnosis not present

## 2015-10-12 DIAGNOSIS — M159 Polyosteoarthritis, unspecified: Secondary | ICD-10-CM

## 2015-10-12 DIAGNOSIS — M150 Primary generalized (osteo)arthritis: Secondary | ICD-10-CM

## 2015-10-12 DIAGNOSIS — C50412 Malignant neoplasm of upper-outer quadrant of left female breast: Secondary | ICD-10-CM | POA: Diagnosis not present

## 2015-10-12 DIAGNOSIS — M8580 Other specified disorders of bone density and structure, unspecified site: Secondary | ICD-10-CM

## 2015-10-12 DIAGNOSIS — M8949 Other hypertrophic osteoarthropathy, multiple sites: Secondary | ICD-10-CM

## 2015-10-12 NOTE — Progress Notes (Signed)
Location:  [facility] clinic Provider:  Teliyah Royal L. Mariea Clonts, D.O., C.M.D.  Code Status: DNR Goals of Care:  Advanced Directives 10/12/2015  Does patient have an advance directive? No  Type of Advance Directive -  Does patient want to make changes to advanced directive? -  Copy of advanced directive(s) in chart? -  Would patient like information on creating an advanced directive? -     Chief Complaint  Patient presents with  . Medical Management of Chronic Issues    18mth follow-up    HPI: Patient is a 70 y.o. female seen today for medical management of chronic diseases.    Husband's PET scan showed 50% reduction in tumors.  Has less stress.  Rash went away.  Robyne Askew gave her a couple of scripts--either tamoxifen or gabapentin.  Quit the gabapentin and hasn't come back.  Is sleeping ok and dealing with the hot flashes.  She had said her thyroid was responsible for the rash in the notes.  TSH 8.75.  Feels fine.  She has gained some weight--our scale is higher than hers though.  1 lb up today.  Is eating some donuts from her husband.    Hand joints still sore.  Still functioning and doing.   Does plan to get her bone density--is waiting on [employer] has kicked in.  Will do this in a couple of months.    Has a cataract she needs to get removed.  Does not believe she's had a blood transfusion.  Possibly with ectopic pregnancy.    Past Medical History  Diagnosis Date  . Hypothyroid   . S/P bilateral breast biopsy 2013  . Complication of anesthesia     slow to wake up  . Normal nuclear stress test     [facility], 07/18/2011  . Shortness of breath     /w housework  . Reflux esophagitis     pt. knowledgeable about dietary intake relative to reflux  . Breast cancer of upper-outer quadrant of left female breast ([place])     left breast cancer  . Arthritis     OA- fingers, toes, back pain, hip pain   . Symptomatic menopausal or female climacteric states   . Malignant  neoplasm of breast (female), unspecified site   . Pain in joint, pelvic region and thigh   . Disorder of bone and cartilage, unspecified   . Lumbago   . Aphasia   . Osteoarthritis (arthritis due to wear and tear of joints)     Past Surgical History  Procedure Laterality Date  . Cataract extraction  2012  . Dilation and curettage of uterus  1978  . Eye surgery      R cat. extracted /w IOL  . Tonsillectomy      as a child  . Ectopic pregnancy surgery      1980  . Breast surgery      Procedure: BREAST BIOPSY WITH NEEDLE LOCALIZATION;  Sur  . Breast biopsy  07/26/2011    Procedure: BREAST BIOPSY WITH NEEDLE LOCALIZATION;  Surgeon: Rolm Bookbinder, MD;  Location: [place];  Service: General;  Laterality: Right;  Right breast wire guided biospy Needle localization @ [facility]  7:30    Allergies  Allergen Reactions  . Benadryl [Diphenhydramine Hcl] Other (See Comments)    "Super hyper"  . Tape     Skin breakdown/redness   . Boniva [Ibandronate Sodium] Other (See Comments)    Neck, shoulder limited mobility   . Chlorhexidine Gluconate   .  Peridin-C [Ascorbic Acid]   . Venlafaxine   . Codeine Other (See Comments)    "can't wake up from it."      Medication List       This list is accurate as of: 10/12/15 10:24 AM.  Always use your most recent med list.               CALCIUM + D PO  Take 600 mg by mouth 2 (two) times daily.     cholecalciferol 1000 units tablet  Commonly known as:  VITAMIN D  Take 1,000 Units by mouth 2 (two) times daily.     ibuprofen 100 MG tablet  Commonly known as:  ADVIL,MOTRIN  Take 400 mg by mouth every 6 (six) hours as needed. For pain     SYNTHROID 75 MCG tablet  Generic drug:  levothyroxine  TAKE 1 TABLET BY MOUTH EVERY DAY BEFORE BREAKFAST     tamoxifen 20 MG tablet  Commonly known as:  NOLVADEX  Take 1 tablet (20 mg total) by mouth daily.     vitamin E 400 UNIT capsule  Take 400 Units by mouth 2 (two) times daily.     VOLTAREN 1 %  Gel  Generic drug:  diclofenac sodium  Ad lib.        Review of Systems:  Review of Systems  Constitutional: Negative for fever and malaise/fatigue.       Wt gain due to husband buying donuts  HENT: Negative for congestion.   Eyes: Negative for blurred vision.       Cataract  Respiratory: Negative for cough and shortness of breath.   Cardiovascular: Negative for chest pain, palpitations and leg swelling.  Gastrointestinal: Negative for abdominal pain, constipation, blood in stool and melena.  Genitourinary: Negative for dysuria.  Musculoskeletal: Positive for joint pain. Negative for myalgias and falls.       Hands esp bothersome  Skin: Negative for itching and rash.       Rash resolved  Neurological: Negative for dizziness, loss of consciousness, weakness and headaches.  Endo/Heme/Allergies: Does not bruise/bleed easily.  Psychiatric/Behavioral: Negative for depression and memory loss. The patient does not have insomnia.     Health Maintenance  Topic Date Due  . Hepatitis C Screening  Jul 24, 1945  . INFLUENZA VACCINE  11/28/2015  . MAMMOGRAM  01/31/2017  . COLONOSCOPY  03/23/2022  . TETANUS/TDAP  04/16/2023  . DEXA SCAN  Completed  . ZOSTAVAX  Completed  . PNA vac Low Risk Adult  Completed    Physical Exam: Filed Vitals:   10/12/15 0947  BP: 120/60  Pulse: 71  Temp: 98.2 F (36.8 C)  TempSrc: Oral  Height: 5\' 2"  (1.575 m)  Weight: 154 lb (69.854 kg)  SpO2: 97%   Body mass index is 28.16 kg/(m^2). Physical Exam  Constitutional: She is oriented to person, place, and time. She appears well-developed and well-nourished. No distress.  Cardiovascular: Normal rate, regular rhythm, normal heart sounds and intact distal pulses.   Pulmonary/Chest: Effort normal and breath sounds normal.  Abdominal: Soft. Bowel sounds are normal.  Musculoskeletal: Normal range of motion.  Neurological: She is alert and oriented to person, place, and time.  Skin: Skin is warm and dry.  No rash noted.  Psychiatric: She has a normal mood and affect.    Labs reviewed: Basic Metabolic Panel:  Recent Labs  11/22/14 0836 02/16/15 1414 04/13/15 1450 09/07/15  NA  --  141  --  140  K  --  3.8  --  4.0  CO2  --  28  --   --   GLUCOSE  --  87  --   --   BUN  --  10.7  --  11  CREATININE  --  0.9  --  0.8  CALCIUM  --  9.1  --   --   TSH 0.072*  --  5.740* 8.75*   Liver Function Tests:  Recent Labs  02/16/15 1414 09/07/15  AST 23 20  ALT 25 16  ALKPHOS 39* 35  BILITOT 0.30  --   PROT 6.9  --   ALBUMIN 3.8  --    No results for input(s): LIPASE, AMYLASE in the last 8760 hours. No results for input(s): AMMONIA in the last 8760 hours. CBC:  Recent Labs  02/16/15 1414 09/07/15  WBC 5.1 4.6  NEUTROABS 2.7  --   HGB 13.5 13.2  HCT 40.2 40  MCV 94.0  --   PLT 209 192   Lipid Panel: No results for input(s): CHOL, HDL, LDLCALC, TRIG, CHOLHDL, LDLDIRECT in the last 8760 hours. No results found for: HGBA1C  Procedures since last visit: No results found.  Assessment/Plan 1. Hypothyroidism, unspecified hypothyroidism type - check free levels now and then again before next visit - T4, Free - T3, Free - TSH; Future - T4, Free; Future  2. Need for hepatitis C screening test - Hep C Antibody; Future agreed to check  3. Primary osteoarthritis involving multiple joints - hands bothering her now -cont voltaren gel and regular mobiity - CBC with Differential/Platelet; Future - Comprehensive metabolic panel; Future  4. Breast cancer of upper-outer quadrant of left female breast ([place]) - contiues on tamoxifen for at least 5 years - CBC with Differential/Platelet; Future - Comprehensive metabolic panel; Future  5. Hot flashes due to tamoxifen -felt that maybe her rash was from gabapentin so she is off of it now so hot flashes are back - CBC with Differential/Platelet; Future - Comprehensive metabolic panel; Future  6. Osteopenia -cont vitamin D and  weightbearing exercise, f/u bone density as she plans to when insurance goes through - CBC with Differential/Platelet; Future - Comprehensive metabolic panel; Future  7. Screening, lipid -check flp  Labs/tests ordered:   Orders Placed This Encounter  Procedures  . T4, Free  . T3, Free  . CBC with Differential/Platelet    Standing Status: Future     Number of Occurrences:      Standing Expiration Date: 10/11/2016  . Comprehensive metabolic panel    Standing Status: Future     Number of Occurrences:      Standing Expiration Date: 10/11/2016    Order Specific Question:  Has the patient fasted?    Answer:  Yes  . TSH    Standing Status: Future     Number of Occurrences:      Standing Expiration Date: 10/11/2016  . T4, Free    Standing Status: Future     Number of Occurrences:      Standing Expiration Date: 10/11/2016  . Hep C Antibody    Standing Status: Future     Number of Occurrences:      Standing Expiration Date: 10/12/2016  turned out hep c and thyroid tests could not be done the next day, so she would have to come back asap for those   Next appt:  6 mos for annual with labs before, but return asap for rest of thyroid tests and hep c screen if  could not be added  Doretha Goding L. Maxie Slovacek, D.O. [facility] [address], [place] [address] Cell Phone (Mon-Fri 8am-5pm):  [phone] On Call:  [phone] & follow prompts after 5pm & weekends Office Phone:  [phone] Office Fax:  [phone]

## 2015-10-13 ENCOUNTER — Other Ambulatory Visit: Payer: Medicare Other

## 2015-10-13 DIAGNOSIS — E039 Hypothyroidism, unspecified: Secondary | ICD-10-CM | POA: Diagnosis not present

## 2015-10-14 LAB — T3, FREE: T3, Free: 2.5 pg/mL (ref 2.0–4.4)

## 2015-10-14 LAB — T4, FREE: Free T4: 1.16 ng/dL (ref 0.82–1.77)

## 2015-10-16 ENCOUNTER — Encounter: Payer: Self-pay | Admitting: *Deleted

## 2015-10-16 ENCOUNTER — Encounter: Payer: Self-pay | Admitting: Internal Medicine

## 2015-11-13 ENCOUNTER — Other Ambulatory Visit: Payer: Self-pay | Admitting: Internal Medicine

## 2015-11-13 DIAGNOSIS — H2512 Age-related nuclear cataract, left eye: Secondary | ICD-10-CM | POA: Diagnosis not present

## 2015-11-17 ENCOUNTER — Encounter: Payer: Self-pay | Admitting: Internal Medicine

## 2015-11-17 NOTE — Telephone Encounter (Signed)
Called [facility], we had refilled it on 7/17, it was there waiting on the patient.  Called the patient to let her know it was refilled 7/17, she can pick it up.

## 2015-12-15 DIAGNOSIS — H00025 Hordeolum internum left lower eyelid: Secondary | ICD-10-CM | POA: Diagnosis not present

## 2015-12-26 ENCOUNTER — Encounter: Payer: Self-pay | Admitting: Internal Medicine

## 2015-12-27 ENCOUNTER — Other Ambulatory Visit: Payer: Self-pay | Admitting: Internal Medicine

## 2015-12-27 ENCOUNTER — Encounter: Payer: Self-pay | Admitting: Internal Medicine

## 2015-12-27 MED ORDER — LEVOTHYROXINE SODIUM 88 MCG PO TABS: 88.0000 ug | ORAL_TABLET | Freq: Every day | ORAL | 3 refills | Status: DC

## 2016-02-01 DIAGNOSIS — Z23 Encounter for immunization: Secondary | ICD-10-CM | POA: Diagnosis not present

## 2016-02-05 ENCOUNTER — Encounter: Payer: Self-pay | Admitting: Oncology

## 2016-02-05 ENCOUNTER — Encounter: Payer: Self-pay | Admitting: Internal Medicine

## 2016-02-07 NOTE — Progress Notes (Signed)
Order faxed to [facility].  Sent to scan.

## 2016-02-07 NOTE — Progress Notes (Signed)
Order for bone density faxed to [facility].  Sent to scan.   

## 2016-02-08 DIAGNOSIS — Z853 Personal history of malignant neoplasm of breast: Secondary | ICD-10-CM | POA: Diagnosis not present

## 2016-02-08 DIAGNOSIS — M8589 Other specified disorders of bone density and structure, multiple sites: Secondary | ICD-10-CM | POA: Diagnosis not present

## 2016-02-08 LAB — HM DEXA SCAN

## 2016-02-08 LAB — HM MAMMOGRAPHY

## 2016-02-09 ENCOUNTER — Encounter: Payer: Self-pay | Admitting: *Deleted

## 2016-02-11 NOTE — Progress Notes (Signed)
ID: Ernst Bowler   DOB: [birth year]  MR#: [id number]  UDJ#:[id number]  PCP: Hollace Kinnier, DO GYN: Dr. Tracie Harrier SU: Dr. Donne Hazel OTHER MD:  CHIEF COMPLAINT: Ductal carcinoma in situ  CURRENT TREATMENT: Tamoxifen  BREAST CANCER HISTORY: From the original intake note:   70 year old [place] woman, who had a screening mammogram on 06/19/2011, which showed a 1 cm mass lateral aspect of the left breast. Ultrasound confirmed the presence of a 1.3 x 1 x 0.8 cm lobulated well-circumscribed mass at 3:00 4 cm from the nipple. 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 HER-2 was negative the ratio of 1.03. Bilateral MRI scan performed 07/01/2011 showed bilateral multinodular parenchymal enhancement with a 2 x 1.5 x 1.5 cm irregular enhancing mass left breast at the 2:00 position with corresponding to the recently biopsied mass. Initial enhancing mass was seen in the upper-outer quadrant of the left breast with a second look ultrasound recommended. In addition, in the right breast a 1.3 cm area of nodular clumped linear enhancement was located in the superior subareolar portion of the right breast. A subsequent biopsy of the right breast on 07/02/11 showed an intraductal papilloma with microcalcifications.  On 07/26/11, she underwent a left and right lumpectomy with left axillary sentinel lymph node biopsy by Dr. Donne Hazel.  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  Two examined lymph nodes were negative.  She was started on Tamoxifen in April of 2013.  Her subsequent history is as detailed below   INTERVAL HISTORY:  Evonda returns today for follow up of her estrogen receptor positive breast cancer. She continues on tamoxifen, generally with good tolerance, although she does experience nighttime hot  flashes. She is on gabapentin for this which helps to sleep. She obtains a drug at a good price   REVIEW OF SYSTEMS: Felizardo Hoffmann is worried about weight gain and thinks that possibly tamoxifen may be the cause. She has some joint pains here and there which are not more intense or persistent than before. She exercises by walking about a mile a day. Otherwise a detailed review of systems today was stable.  PAST MEDICAL HISTORY: Past Medical History:  Diagnosis Date  . Aphasia   . Arthritis    OA- fingers, toes, back pain, hip pain   . Breast cancer of upper-outer quadrant of left female breast ([place])    left breast cancer  . Complication of anesthesia    slow to wake up  . Disorder of bone and cartilage, unspecified   . Hypothyroid   . Lumbago   . Malignant neoplasm of breast (female), unspecified site [facility])   . Normal nuclear stress test    [facility], 07/18/2011  . Osteoarthritis (arthritis due to wear and tear of joints)   . Pain in joint, pelvic region and thigh   . Reflux esophagitis    pt. knowledgeable about dietary intake relative to reflux  . S/P bilateral breast biopsy 2013  . Shortness of breath    /w housework  . Symptomatic menopausal or female climacteric states     PAST SURGICAL HISTORY: Past Surgical History:  Procedure Laterality Date  . BREAST BIOPSY  07/26/2011   Procedure: BREAST BIOPSY WITH NEEDLE LOCALIZATION;  Surgeon: Rolm Bookbinder, MD;  Location: [place];  Service: General;  Laterality: Right;  Right breast wire guided biospy Needle localization @ [facility]  7:30  . BREAST SURGERY     Procedure: BREAST BIOPSY WITH NEEDLE LOCALIZATION;  [place]  . CATARACT EXTRACTION  2012  . DILATION AND CURETTAGE OF UTERUS  1978  . ECTOPIC PREGNANCY SURGERY     1980  . EYE SURGERY     R cat. extracted /w IOL  . TONSILLECTOMY     as a child    FAMILY HISTORY Family History  Problem Relation Age of Onset  . Cancer Sister     colon  . Cancer Brother     renal  . Heart  disease Brother   . Cancer Maternal Aunt     breast  . Cancer Cousin   . Cancer Cousin   . Anesthesia problems Neg Hx   . Cancer Mother     leukemia  . Cancer Father     lung  . 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  She lives with her husband.  Her daughter lives next door to her.    ADVANCED DIRECTIVES:  Living will and Healthcare POA  HEALTH MAINTENANCE: Social History  Substance Use Topics  . Smoking status: Former Smoker    Packs/day: 0.25    Quit date: 07/22/1980  . Smokeless tobacco: Never Used  . Alcohol use No    Mammogram: 07/29/11  Colonoscopy: 04/2012  PAP:  03/10/12  Bone density:  07/09/11 with low bone mass  Lipid panel:  Managed by Dr. Mariea Clonts  Allergies  Allergen Reactions  . Benadryl [Diphenhydramine Hcl] Other (See Comments)    "Super hyper"  . Tape     Skin breakdown/redness   . Boniva [Ibandronate Sodium] Other (See Comments)    Neck, shoulder limited mobility   . Chlorhexidine Gluconate   . Peridin-C [Ascorbic Acid]   . Venlafaxine   . Codeine Other (See Comments)    "can't wake up from it."    Current Outpatient Prescriptions  Medication Sig Dispense Refill  . Calcium Carbonate-Vitamin D (CALCIUM + D PO) Take 600 mg by mouth 2 (two) times daily.    . cholecalciferol (VITAMIN D) 1000 UNITS tablet Take 1,000 Units by mouth 2 (two) times daily.    Marland Kitchen ibuprofen (ADVIL,MOTRIN) 100 MG tablet Take 400 mg by mouth every 6 (six) hours as needed. For pain    . levothyroxine (SYNTHROID) 88 MCG tablet Take 1 tablet (88 mcg total) by mouth daily before breakfast. 30 tablet 3  . tamoxifen (NOLVADEX) 20 MG tablet Take 1 tablet (20 mg total) by mouth daily. 30 tablet 6  . vitamin E 400 UNIT capsule Take 400 Units by mouth 2 (two) times daily.    . VOLTAREN 1 % GEL Ad lib.     No current  facility-administered medications for this visit.     OBJECTIVE: Middle-aged white woman in no acute distress Vitals:   02/12/16 1324  BP: (!) 118/55  Pulse: 69  Resp: 18  Temp: 98.3 F (36.8 C)     Body mass index is 29.06 kg/m.    ECOG FS:  1  Sclerae unicteric, pupils round and equal Oropharynx clear and moist-- no thrush or other lesions No cervical or supraclavicular adenopathy Lungs no rales or rhonchi Heart regular rate and rhythm Abd soft, nontender, positive bowel sounds MSK no focal spinal tenderness, no upper extremity lymphedema Neuro: nonfocal,  well oriented, appropriate affect Breasts: Status post bilateral lumpectomies. There is no evidence of disease recurrence. Both axillae are benign.   LAB RESULTS: Lab Results  Component Value Date   WBC 4.4 02/12/2016   NEUTROABS 2.3 02/12/2016   HGB 12.8 02/12/2016   HCT 38.8 02/12/2016   MCV 93.8 02/12/2016   PLT 205 02/12/2016      Chemistry      Component Value Date/Time   NA 142 02/12/2016 1228   K 4.2 02/12/2016 1228   CL 108 04/08/2014 0947   CL 107 06/24/2012 1207   CO2 24 02/12/2016 1228   BUN 11.6 02/12/2016 1228   CREATININE 0.8 02/12/2016 1228   GLU 90 09/07/2015      Component Value Date/Time   CALCIUM 8.5 02/12/2016 1228   ALKPHOS 35 (L) 02/12/2016 1228   AST 18 02/12/2016 1228   ALT 12 02/12/2016 1228   BILITOT 0.38 02/12/2016 1228       Lab Results  Component Value Date   LABCA2 10 07/03/2011    No components found for: [id number]  No results for input(s): INR in the last 168 hours.  Urinalysis No results found for: COLORURINE  STUDIES: Results of recent bone density are pending  ASSESSMENT: 70 y.o. [place] woman Status post left breast upper outer quadrant biopsy 06/24/2011 for what proved to be an adenomyoepithelioma, possibly developing into an invasive ductal carcinoma, estrogen receptor positive, progesterone receptor and HER-2 negative, with an MIB-1 of 12%  #1  S/P   left upper outer quadrant lumpectomy 07/26/11 for Tis N0 , ductal carcinoma in situ low grade, Stage 0, with negative margins  (a) an intraductal papilloma with the right breast was removed on the same date  #2  She was started on Tamoxifen in April of 2013.  #3 osteopenia with a T score of -1.8 on DEXA scan at [facility] 08/05/2013.  PLAN:  Alenna is now 4-1/2 years out from definitive surgery for her breast cancer with no evidence of disease recurrence. This is very favorable.  She is tolerating tamoxifen well and the plan will be to continue that through March of next year.  Given her excellent prognosis I don't think there is any need to continue tamoxifen for a total of 10 years.  Regretfully we did not have the results of her bone density today. I will send her note with the numbers (SAA become available. She understands that tamoxifen helps with bone density issues.  We reviewed the fact that tamoxifen does not cause weight gain, but menopause dollars. I suggest that she cut back on carbohydrates in her diet and perhaps increase her exercise from 1-2 miles today possible.  She knows to call for any problems that may develop before her next visit here, which likely will be her "graduation visit".  Garland Hincapie C    02/12/2016

## 2016-02-12 ENCOUNTER — Other Ambulatory Visit: Payer: Self-pay | Admitting: *Deleted

## 2016-02-12 ENCOUNTER — Ambulatory Visit (HOSPITAL_BASED_OUTPATIENT_CLINIC_OR_DEPARTMENT_OTHER): Payer: Non-veteran care | Admitting: Oncology

## 2016-02-12 ENCOUNTER — Other Ambulatory Visit (HOSPITAL_BASED_OUTPATIENT_CLINIC_OR_DEPARTMENT_OTHER): Payer: Medicare Other

## 2016-02-12 ENCOUNTER — Encounter: Payer: Self-pay | Admitting: *Deleted

## 2016-02-12 VITALS — BP 118/55 | HR 69 | Temp 98.3°F | Resp 18 | Ht 62.0 in | Wt 158.9 lb

## 2016-02-12 DIAGNOSIS — M8580 Other specified disorders of bone density and structure, unspecified site: Secondary | ICD-10-CM | POA: Diagnosis not present

## 2016-02-12 DIAGNOSIS — C50412 Malignant neoplasm of upper-outer quadrant of left female breast: Secondary | ICD-10-CM

## 2016-02-12 DIAGNOSIS — D0512 Intraductal carcinoma in situ of left breast: Secondary | ICD-10-CM | POA: Diagnosis not present

## 2016-02-12 DIAGNOSIS — Z170 Estrogen receptor positive status [ER+]: Secondary | ICD-10-CM | POA: Diagnosis not present

## 2016-02-12 LAB — CBC WITH DIFFERENTIAL/PLATELET
BASO%: 0.8 % (ref 0.0–2.0)
BASOS ABS: 0 10*3/uL (ref 0.0–0.1)
EOS ABS: 0.1 10*3/uL (ref 0.0–0.5)
EOS%: 1.4 % (ref 0.0–7.0)
HCT: 38.8 % (ref 34.8–46.6)
HGB: 12.8 g/dL (ref 11.6–15.9)
LYMPH%: 37.7 % (ref 14.0–49.7)
MCH: 31 pg (ref 25.1–34.0)
MCHC: 33 g/dL (ref 31.5–36.0)
MCV: 93.8 fL (ref 79.5–101.0)
MONO#: 0.4 10*3/uL (ref 0.1–0.9)
MONO%: 8.5 % (ref 0.0–14.0)
NEUT#: 2.3 10*3/uL (ref 1.5–6.5)
NEUT%: 51.6 % (ref 38.4–76.8)
PLATELETS: 205 10*3/uL (ref 145–400)
RBC: 4.14 10*6/uL (ref 3.70–5.45)
RDW: 13 % (ref 11.2–14.5)
WBC: 4.4 10*3/uL (ref 3.9–10.3)
lymph#: 1.7 10*3/uL (ref 0.9–3.3)

## 2016-02-12 LAB — COMPREHENSIVE METABOLIC PANEL
ALT: 12 U/L (ref 0–55)
AST: 18 U/L (ref 5–34)
Albumin: 3.4 g/dL — ABNORMAL LOW (ref 3.5–5.0)
Alkaline Phosphatase: 35 U/L — ABNORMAL LOW (ref 40–150)
Anion Gap: 8 mEq/L (ref 3–11)
BILIRUBIN TOTAL: 0.38 mg/dL (ref 0.20–1.20)
BUN: 11.6 mg/dL (ref 7.0–26.0)
CALCIUM: 8.5 mg/dL (ref 8.4–10.4)
CO2: 24 mEq/L (ref 22–29)
CREATININE: 0.8 mg/dL (ref 0.6–1.1)
Chloride: 110 mEq/L — ABNORMAL HIGH (ref 98–109)
EGFR: 70 mL/min/{1.73_m2} — ABNORMAL LOW (ref >90)
Glucose: 82 mg/dl (ref 70–140)
Potassium: 4.2 mEq/L (ref 3.5–5.1)
Sodium: 142 mEq/L (ref 136–145)
TOTAL PROTEIN: 6.6 g/dL (ref 6.4–8.3)

## 2016-02-15 ENCOUNTER — Other Ambulatory Visit: Payer: Self-pay

## 2016-02-15 ENCOUNTER — Encounter: Payer: Self-pay | Admitting: Internal Medicine

## 2016-02-15 DIAGNOSIS — Z1159 Encounter for screening for other viral diseases: Secondary | ICD-10-CM

## 2016-02-20 DIAGNOSIS — Z961 Presence of intraocular lens: Secondary | ICD-10-CM | POA: Diagnosis not present

## 2016-02-20 DIAGNOSIS — H2512 Age-related nuclear cataract, left eye: Secondary | ICD-10-CM | POA: Diagnosis not present

## 2016-02-20 DIAGNOSIS — H18413 Arcus senilis, bilateral: Secondary | ICD-10-CM | POA: Diagnosis not present

## 2016-02-20 DIAGNOSIS — H02839 Dermatochalasis of unspecified eye, unspecified eyelid: Secondary | ICD-10-CM | POA: Diagnosis not present

## 2016-02-26 DIAGNOSIS — H2512 Age-related nuclear cataract, left eye: Secondary | ICD-10-CM | POA: Diagnosis not present

## 2016-02-28 ENCOUNTER — Telehealth: Payer: Self-pay | Admitting: Internal Medicine

## 2016-02-28 NOTE — Telephone Encounter (Signed)
left msg asking pt to confirm if this AWV appt is ok and moving lab appt to this day also. VDM (DD)

## 2016-04-10 ENCOUNTER — Other Ambulatory Visit: Payer: Self-pay

## 2016-04-10 ENCOUNTER — Other Ambulatory Visit: Payer: Medicare Other

## 2016-04-10 DIAGNOSIS — E079 Disorder of thyroid, unspecified: Secondary | ICD-10-CM

## 2016-04-10 DIAGNOSIS — N951 Menopausal and female climacteric states: Secondary | ICD-10-CM

## 2016-04-11 ENCOUNTER — Ambulatory Visit (INDEPENDENT_AMBULATORY_CARE_PROVIDER_SITE_OTHER): Payer: Medicare Other

## 2016-04-11 ENCOUNTER — Other Ambulatory Visit: Payer: Medicare Other

## 2016-04-11 VITALS — BP 140/78 | HR 73 | Temp 98.4°F | Ht 62.0 in | Wt 158.8 lb

## 2016-04-11 DIAGNOSIS — N951 Menopausal and female climacteric states: Secondary | ICD-10-CM

## 2016-04-11 DIAGNOSIS — Z0000 Encounter for general adult medical examination without abnormal findings: Secondary | ICD-10-CM

## 2016-04-11 DIAGNOSIS — Z1159 Encounter for screening for other viral diseases: Secondary | ICD-10-CM | POA: Diagnosis not present

## 2016-04-11 DIAGNOSIS — E079 Disorder of thyroid, unspecified: Secondary | ICD-10-CM

## 2016-04-11 LAB — COMPLETE METABOLIC PANEL WITH GFR
ALT: 14 U/L (ref 6–29)
AST: 17 U/L (ref 10–35)
Albumin: 3.8 g/dL (ref 3.6–5.1)
Alkaline Phosphatase: 30 U/L — ABNORMAL LOW (ref 33–130)
BUN: 13 mg/dL (ref 7–25)
CO2: 27 mmol/L (ref 20–31)
Calcium: 8.5 mg/dL — ABNORMAL LOW (ref 8.6–10.4)
Chloride: 107 mmol/L (ref 98–110)
Creat: 0.87 mg/dL (ref 0.60–0.93)
GFR, Est African American: 78 mL/min (ref >=60)
GFR, Est Non African American: 67 mL/min (ref >=60)
Glucose, Bld: 85 mg/dL (ref 65–99)
Potassium: 4.3 mmol/L (ref 3.5–5.3)
Sodium: 141 mmol/L (ref 135–146)
Total Bilirubin: 0.5 mg/dL (ref 0.2–1.2)
Total Protein: 6.3 g/dL (ref 6.1–8.1)

## 2016-04-11 LAB — CBC WITH DIFFERENTIAL/PLATELET
Basophils Absolute: 39 cells/uL (ref 0–200)
Basophils Relative: 1 %
Eosinophils Absolute: 39 cells/uL (ref 15–500)
Eosinophils Relative: 1 %
HCT: 40.3 % (ref 35.0–45.0)
Hemoglobin: 13.1 g/dL (ref 11.7–15.5)
Lymphocytes Relative: 34 %
Lymphs Abs: 1326 cells/uL (ref 850–3900)
MCH: 31 pg (ref 27.0–33.0)
MCHC: 32.5 g/dL (ref 32.0–36.0)
MCV: 95.5 fL (ref 80.0–100.0)
MPV: 9.4 fL (ref 7.5–12.5)
Monocytes Absolute: 351 cells/uL (ref 200–950)
Monocytes Relative: 9 %
Neutro Abs: 2145 cells/uL (ref 1500–7800)
Neutrophils Relative %: 55 %
Platelets: 197 10*3/uL (ref 140–400)
RBC: 4.22 MIL/uL (ref 3.80–5.10)
RDW: 13.7 % (ref 11.0–15.0)
WBC: 3.9 10*3/uL (ref 3.8–10.8)

## 2016-04-11 LAB — T4, FREE: Free T4: 1.3 ng/dL (ref 0.8–1.8)

## 2016-04-11 LAB — TSH: TSH: 1.64 mIU/L

## 2016-04-11 NOTE — Progress Notes (Signed)
Quick Notes   Health Maintenance:   Up to Date on everything   Abnormal Screen:  None; MMSE-30/30 Passed Clock Test   Patient Concerns:  Pt has had pain behind her Right knee x 1 month. States it feels like "its going to give way" when she is walking. Pt also scored a 6 on her Depression scale. She has a lot going on,  Husband has terminal cancer, loss of his therapy dog recently. Pt voiced she does not want to be put on meds,     Nurse Concerns:   None

## 2016-04-11 NOTE — Progress Notes (Signed)
Subjective:   Sylvia [name] is a 70 y.o. female who presents for Medicare Annual (Subsequent) preventive examination.  Review of Systems:  Cardiac Risk Factors include: advanced age (>22men, >73 women);sedentary lifestyle;family history of premature cardiovascular disease     Objective:     Vitals: BP 140/78 (BP Location: Left Arm, Patient Position: Sitting, Cuff Size: Normal)   Pulse 73   Temp 98.4 F (36.9 C) (Oral)   Ht 5\' 2"  (1.575 m)   Wt 158 lb 12.8 oz (72 kg)   SpO2 98%   BMI 29.04 kg/m   Body mass index is 29.04 kg/m.   Tobacco History  Smoking Status  . Former Smoker  . Packs/day: 0.25  . Quit date: 07/22/1980  Smokeless Tobacco  . Never Used     Counseling given: No   Past Medical History:  Diagnosis Date  . Aphasia   . Arthritis    OA- fingers, toes, back pain, hip pain   . Breast cancer of upper-outer quadrant of left female breast ([place])    left breast cancer  . Complication of anesthesia    slow to wake up  . Disorder of bone and cartilage, unspecified   . Hypothyroid   . Lumbago   . Malignant neoplasm of breast (female), unspecified site   . Normal nuclear stress test    [facility], 07/18/2011  . Osteoarthritis (arthritis due to wear and tear of joints)   . Pain in joint, pelvic region and thigh   . Reflux esophagitis    pt. knowledgeable about dietary intake relative to reflux  . S/P bilateral breast biopsy 2013  . Shortness of breath    /w housework  . Symptomatic menopausal or female climacteric states    Past Surgical History:  Procedure Laterality Date  . BREAST BIOPSY  07/26/2011   Procedure: BREAST BIOPSY WITH NEEDLE LOCALIZATION;  Surgeon: Rolm Bookbinder, MD;  Location: [facility];  Service: General;  Laterality: Right;  Right breast wire guided biospy Needle localization @ [facility]  7:30  . BREAST SURGERY     Procedure: BREAST BIOPSY WITH NEEDLE LOCALIZATION;  [place]  . CATARACT EXTRACTION  2012  . DILATION AND CURETTAGE OF UTERUS   1978  . ECTOPIC PREGNANCY SURGERY     1980  . EYE SURGERY     R cat. extracted /w IOL  . TONSILLECTOMY     as a child   Family History  Problem Relation Age of Onset  . Cancer Sister     colon  . Cancer Brother     renal  . Heart disease Brother   . Cancer Mother     leukemia  . Cancer Father     lung  . Heart attack Brother   . Cancer Maternal Aunt     breast  . Cancer Cousin   . Cancer Cousin   . Anesthesia problems Neg Hx    History  Sexual Activity  . Sexual activity: Not Currently  . Birth control/ protection: None    Outpatient Encounter Prescriptions as of 04/11/2016  Medication Sig  . Calcium Carbonate-Vitamin D (CALCIUM + D PO) Take 600 mg by mouth 2 (two) times daily.  . cholecalciferol (VITAMIN D) 1000 UNITS tablet Take 1,000 Units by mouth 2 (two) times daily.  Marland Kitchen ibuprofen (ADVIL,MOTRIN) 100 MG tablet Take 400 mg by mouth every 6 (six) hours as needed. For pain  . levothyroxine (SYNTHROID) 88 MCG tablet Take 1 tablet (88 mcg total) by mouth daily  before breakfast.  . tamoxifen (NOLVADEX) 20 MG tablet Take 1 tablet (20 mg total) by mouth daily.  . vitamin E 400 UNIT capsule Take 400 Units by mouth 2 (two) times daily.  . VOLTAREN 1 % GEL Ad lib.   No facility-administered encounter medications on file as of 04/11/2016.     Activities of Daily Living In your present state of health, do you have any difficulty performing the following activities: 04/11/2016  Hearing? Y  Vision? Y  Difficulty concentrating or making decisions? Y  Walking or climbing stairs? N  Doing errands, shopping? N  Preparing Food and eating ? N  Using the Toilet? N  In the past six months, have you accidently leaked urine? N  Do you have problems with loss of bowel control? N  Managing your Medications? N  Managing your Finances? N  Housekeeping or managing your Housekeeping? N  Some recent data might be hidden    Patient Care Team: Gayland Curry, DO as PCP - General  (Geriatric Medicine) Warren Danes, PA-C as Physician Assistant (Dermatology) Chauncey Cruel, MD as Consulting Physician (Oncology)    Assessment:    Exercise Activities and Dietary recommendations Current Exercise Habits: The patient does not participate in regular exercise at present, Exercise limited by: None identified  Goals    . Weight (lb) < 145 lb (65.8 kg)          Starting 04/11/16, I will attempt to decrease my weight to get to 145-147lbs.       Fall Risk Fall Risk  04/11/2016 10/12/2015 04/13/2015 10/10/2014 04/08/2014  Falls in the past year? No No No No No   Depression Screen PHQ 2/9 Scores 04/11/2016 10/12/2015 04/13/2015 04/08/2014  PHQ - 2 Score 3 0 0 0  PHQ- 9 Score 6 - - -     Cognitive Function MMSE - Mini Mental State Exam 04/11/2016 04/13/2015 04/08/2014  Orientation to time 5 5 5   Orientation to Place 5 5 5   Registration 3 3 3   Attention/ Calculation 5 5 5   Recall 3 3 2   Language- name 2 objects 2 2 2   Language- repeat 1 1 1   Language- follow 3 step command 3 3 3   Language- read & follow direction 1 1 1   Write a sentence 1 1 1   Copy design 1 1 1   Total score 30 30 29         Immunization History  Administered Date(s) Administered  . Influenza Whole 03/18/2014  . Influenza, Seasonal, Injecte, Preservative Fre 02/01/2016  . Influenza-Unspecified 02/05/2010, 03/30/2012, 04/02/2013, 02/21/2015  . Pneumococcal Conjugate-13 04/08/2014  . Pneumococcal Polysaccharide-23 09/28/2012  . Td 04/29/1978  . Tdap 04/15/2013  . Zoster 01/24/2014   Screening Tests Health Maintenance  Topic Date Due  . MAMMOGRAM  02/07/2018  . COLONOSCOPY  03/23/2022  . TETANUS/TDAP  04/16/2023  . INFLUENZA VACCINE  Completed  . DEXA SCAN  Completed  . ZOSTAVAX  Completed  . Hepatitis C Screening  Completed  . PNA vac Low Risk Adult  Completed      Plan:    I have personally reviewed and addressed the Medicare Annual Wellness questionnaire and have noted the  following in the patient's chart:  A. Medical and social history B. Use of alcohol, tobacco or illicit drugs  C. Current medications and supplements D. Functional ability and status E.  Nutritional status F.  Physical activity G. Advance directives H. List of other physicians I.  Hospitalizations, surgeries, and ER visits  in previous 12 months J.  [place] to include hearing, vision, cognitive, depression L. Referrals and appointments - none  In addition, I have reviewed and discussed with patient certain preventive protocols, quality metrics, and best practice recommendations. A written personalized care plan for preventive services as well as general preventive health recommendations were provided to patient.  See attached scanned questionnaire for additional information.   Signed,   Allyn Kenner, LPN Health Advisor  I reviewed health advisor's note, was available for consultation and agree with the assessment and plan as written.  Pt will need appt to address her knee and her depression.    Talana Slatten L. Nickolis Diel, D.O. [facility] [address], [place] [address] Cell Phone (Mon-Fri 8am-5pm):  [phone] On Call:  [phone] & follow prompts after 5pm & weekends Office Phone:  [phone] Office Fax:  [phone]

## 2016-04-11 NOTE — Patient Instructions (Addendum)
Sylvia Sylvia [name] , Thank you for taking time to come for your Medicare Wellness Visit. I appreciate your ongoing commitment to your health goals. Please review the following plan we discussed and let me know if I can assist you in the future.   These are the goals we discussed: Goals    . Weight (lb) < 145 lb (65.8 kg)          Starting 04/11/16, I will attempt to decrease my weight to get to 145-147lbs.        This is a list of the screening recommended for you and due dates:  Health Maintenance  Topic Date Due  . Mammogram  02/07/2018  . Colon Cancer Screening  03/23/2022  . Tetanus Vaccine  04/16/2023  . Flu Shot  Completed  . DEXA scan (bone density measurement)  Completed  . Shingles Vaccine  Completed  .  Hepatitis C: One time screening is recommended by Center for Disease Control  (CDC) for  adults born from [birth year] through 1966.   Completed  . Pneumonia vaccines  Completed  Preventive Care for Adults  A healthy lifestyle and preventive care can promote health and wellness. Preventive health guidelines for adults include the following key practices.  . A routine yearly physical is a good way to check with your health care provider about your health and preventive screening. It is a chance to share any concerns and updates on your health and to receive a thorough exam.  . Visit your dentist for a routine exam and preventive care every 6 months. Brush your teeth twice a day and floss once a day. Good oral hygiene prevents tooth decay and gum disease.  . The frequency of eye exams is based on your age, health, family medical history, use  of contact lenses, and other factors. Follow your health care provider's ecommendations for frequency of eye exams.  . Eat a healthy diet. Foods like vegetables, fruits, whole grains, low-fat dairy products, and lean protein foods contain the nutrients you need without too many calories. Decrease your intake of foods Sylvia [name] in solid fats, added sugars,  and salt. Eat the right amount of calories for you. Get information about a proper diet from your health care provider, if necessary.  . Regular physical exercise is one of the most important things you can do for your health. Most adults should get at least 150 minutes of moderate-intensity exercise (any activity that increases your heart rate and causes you to sweat) each week. In addition, most adults need muscle-strengthening exercises on 2 or more days a week.  Silver Sneakers may be a benefit available to you. To determine eligibility, you may visit the website: www.silversneakers.com or contact program at (715)378-7875 Mon-Fri between 8AM-8PM.   . Maintain a healthy weight. The body mass index (BMI) is a screening tool to identify possible weight problems. It provides an estimate of body fat based on height and weight. Your health care provider can find your BMI and can help you achieve or maintain a healthy weight.   For adults 20 years and older: ? A BMI below 18.5 is considered underweight. ? A BMI of 18.5 to 24.9 is normal. ? A BMI of 25 to 29.9 is considered overweight. ? A BMI of 30 and above is considered obese.   . Maintain normal blood lipids and cholesterol levels by exercising and minimizing your intake of saturated fat. Eat a balanced diet with plenty of fruit and vegetables. Blood tests for lipids  and cholesterol should begin at age 57 and be repeated every 5 years. If your lipid or cholesterol levels are Sylvia [name], you are over 50, or you are at Sylvia [name] risk for heart disease, you may need your cholesterol levels checked more frequently. Ongoing Sylvia [name] lipid and cholesterol levels should be treated with medicines if diet and exercise are not working.  . If you smoke, find out from your health care provider how to quit. If you do not use tobacco, please do not start.  . If you choose to drink alcohol, please do not consume more than 2 drinks per day. One drink is considered to be 12  ounces (355 mL) of beer, 5 ounces (148 mL) of wine, or 1.5 ounces (44 mL) of liquor.  . If you are 42-12 years old, ask your health care provider if you should take aspirin to prevent strokes.  . Use sunscreen. Apply sunscreen liberally and repeatedly throughout the day. You should seek shade when your shadow is shorter than you. Protect yourself by wearing long sleeves, pants, a wide-brimmed hat, and sunglasses year round, whenever you are outdoors.  . Once a month, do a whole body skin exam, using a mirror to look at the skin on your back. Tell your health care provider of new moles, moles that have irregular borders, moles that are larger than a pencil eraser, or moles that have changed in shape or color.

## 2016-04-12 LAB — HEPATITIS C ANTIBODY: HCV Ab: NEGATIVE

## 2016-04-15 ENCOUNTER — Encounter: Payer: Self-pay | Admitting: Internal Medicine

## 2016-04-15 ENCOUNTER — Encounter: Payer: Self-pay | Admitting: *Deleted

## 2016-04-15 ENCOUNTER — Other Ambulatory Visit: Payer: Self-pay | Admitting: *Deleted

## 2016-04-15 ENCOUNTER — Ambulatory Visit (INDEPENDENT_AMBULATORY_CARE_PROVIDER_SITE_OTHER): Payer: Medicare Other | Admitting: Internal Medicine

## 2016-04-15 VITALS — BP 112/68 | HR 72 | Temp 98.3°F | Ht 62.0 in | Wt 159.0 lb

## 2016-04-15 DIAGNOSIS — M8949 Other hypertrophic osteoarthropathy, multiple sites: Secondary | ICD-10-CM

## 2016-04-15 DIAGNOSIS — C50412 Malignant neoplasm of upper-outer quadrant of left female breast: Secondary | ICD-10-CM

## 2016-04-15 DIAGNOSIS — R232 Flushing: Secondary | ICD-10-CM

## 2016-04-15 DIAGNOSIS — M238X1 Other internal derangements of right knee: Secondary | ICD-10-CM | POA: Diagnosis not present

## 2016-04-15 DIAGNOSIS — T451X5A Adverse effect of antineoplastic and immunosuppressive drugs, initial encounter: Secondary | ICD-10-CM

## 2016-04-15 DIAGNOSIS — E039 Hypothyroidism, unspecified: Secondary | ICD-10-CM

## 2016-04-15 DIAGNOSIS — M150 Primary generalized (osteo)arthritis: Secondary | ICD-10-CM

## 2016-04-15 DIAGNOSIS — M25561 Pain in right knee: Secondary | ICD-10-CM | POA: Diagnosis not present

## 2016-04-15 DIAGNOSIS — M8589 Other specified disorders of bone density and structure, multiple sites: Secondary | ICD-10-CM | POA: Diagnosis not present

## 2016-04-15 DIAGNOSIS — M159 Polyosteoarthritis, unspecified: Secondary | ICD-10-CM

## 2016-04-15 DIAGNOSIS — Z170 Estrogen receptor positive status [ER+]: Principal | ICD-10-CM

## 2016-04-15 MED ORDER — TAMOXIFEN CITRATE 20 MG PO TABS: 20.0000 mg | ORAL_TABLET | Freq: Every day | ORAL | 6 refills | Status: DC

## 2016-04-15 NOTE — Progress Notes (Signed)
Location:  [facility] clinic Provider:  Veto Macqueen L. Mariea Clonts, D.O., C.M.D.  Code Status: DNR Goals of Care:  Advanced Directives 04/15/2016  Does Patient Have a Medical Advance Directive? Yes  Type of Advance Directive -  Does patient want to make changes to medical advance directive? -  Copy of [place] in Chart? No - copy requested  Would patient like information on creating a medical advance directive? -     Chief Complaint  Patient presents with  . Medical Management of Chronic Issues    annual visit    HPI: Patient is a 70 y.o. female seen today for medical management of chronic diseases--extended visit.  Her husband is responding well to treatments, but 70% of the time, he has no energy.  She had to put her dog down in the fall--Sept.        She, too, has good and bad days.  She is still working, taking care of the house and functioning.    She is having pain in both thumb joints.  Her right knee is wanting to go out on her.  It hasn't yet, but she's been very careful on the steps.  Has not been walking as much which is affected her weight and making it worse.  It's only just begun in the past month.  She had been walking a mile and a 1/4 daily until this started.  Aches in the back of the knee and may go down the calf.  No notable swelling.    Due for cscope next year with family h/o of colon ca and also prior polyps 5 years ago.  Past Medical History:  Diagnosis Date  . Aphasia   . Arthritis    OA- fingers, toes, back pain, hip pain   . Breast cancer of upper-outer quadrant of left female breast ([place])    left breast cancer  . Complication of anesthesia    slow to wake up  . Disorder of bone and cartilage, unspecified   . Hypothyroid   . Lumbago   . Malignant neoplasm of breast (female), unspecified site   . Normal nuclear stress test    [facility], 07/18/2011  . Osteoarthritis (arthritis due to wear and tear of joints)   . Pain in joint, pelvic region  and thigh   . Reflux esophagitis    pt. knowledgeable about dietary intake relative to reflux  . S/P bilateral breast biopsy 2013  . Shortness of breath    /w housework  . Symptomatic menopausal or female climacteric states     Past Surgical History:  Procedure Laterality Date  . BREAST BIOPSY  07/26/2011   Procedure: BREAST BIOPSY WITH NEEDLE LOCALIZATION;  Surgeon: Rolm Bookbinder, MD;  Location: [facility];  Service: General;  Laterality: Right;  Right breast wire guided biospy Needle localization @ [facility]  7:30  . BREAST SURGERY     Procedure: BREAST BIOPSY WITH NEEDLE LOCALIZATION;  [facility]  . CATARACT EXTRACTION  2012  . DILATION AND CURETTAGE OF UTERUS  1978  . ECTOPIC PREGNANCY SURGERY     1980  . EYE SURGERY     R cat. extracted /w IOL  . TONSILLECTOMY     as a child    Allergies  Allergen Reactions  . Benadryl [Diphenhydramine Hcl] Other (See Comments)    "Super hyper"  . Tape     Skin breakdown/redness   . Boniva [Ibandronate Sodium] Other (See Comments)    Neck, shoulder limited mobility   .  Chlorhexidine Gluconate   . Peridin-C [Ascorbic Acid]   . Venlafaxine   . Codeine Other (See Comments)    "can't wake up from it."  . Sulfa Antibiotics Rash    Patient unsure of reaction    Allergies as of 04/15/2016      Reactions   Benadryl [diphenhydramine Hcl] Other (See Comments)   "Super hyper"   Tape    Skin breakdown/redness   Boniva [ibandronate Sodium] Other (See Comments)   Neck, shoulder limited mobility    Chlorhexidine Gluconate    Peridin-c [ascorbic Acid]    Venlafaxine    Codeine Other (See Comments)   "can't wake up from it."   Sulfa Antibiotics Rash   Patient unsure of reaction      Medication List       Accurate as of 04/15/16  1:57 PM. Always use your most recent med list.          CALCIUM + D PO Take 600 mg by mouth 2 (two) times daily.   cholecalciferol 1000 units tablet Commonly known as:  VITAMIN D Take 1,000 Units by mouth 2  (two) times daily.   ibuprofen 100 MG tablet Commonly known as:  ADVIL,MOTRIN Take 400 mg by mouth every 6 (six) hours as needed. For pain   levothyroxine 88 MCG tablet Commonly known as:  SYNTHROID Take 1 tablet (88 mcg total) by mouth daily before breakfast.   tamoxifen 20 MG tablet Commonly known as:  NOLVADEX Take 1 tablet (20 mg total) by mouth daily.   vitamin E 400 UNIT capsule Take 400 Units by mouth 2 (two) times daily.   VOLTAREN 1 % Gel Generic drug:  diclofenac sodium Ad lib.       Review of Systems:  Review of Systems  Constitutional: Negative for chills and fever.  HENT: Negative for congestion and hearing loss.   Eyes: Negative for blurred vision.  Respiratory: Negative for shortness of breath.   Cardiovascular: Negative for chest pain, palpitations and leg swelling.  Gastrointestinal: Negative for abdominal pain, blood in stool, constipation, heartburn and melena.  Genitourinary: Negative for dysuria, frequency and urgency.  Musculoskeletal: Positive for joint pain. Negative for back pain, falls, myalgias and neck pain.       Right knee, bilateral hips, bilateral thumb joints  Skin: Negative for itching and rash.  Neurological: Negative for dizziness and loss of consciousness.  Endo/Heme/Allergies:       Hot flashes better with the gabapentin  Psychiatric/Behavioral: Negative for memory loss. The patient is not nervous/anxious and does not have insomnia.        Periods of sadness related to husband's terminal cancer    Health Maintenance  Topic Date Due  . MAMMOGRAM  02/07/2018  . COLONOSCOPY  03/23/2022  . TETANUS/TDAP  04/16/2023  . INFLUENZA VACCINE  Completed  . DEXA SCAN  Completed  . ZOSTAVAX  Completed  . Hepatitis C Screening  Completed  . PNA vac Low Risk Adult  Completed    Physical Exam: Vitals:   04/15/16 1344  BP: 112/68  Pulse: 72  Temp: 98.3 F (36.8 C)  TempSrc: Oral  SpO2: 96%  Weight: 159 lb (72.1 kg)  Height: 5\' 2"   (1.575 m)   Body mass index is 29.08 kg/m. Physical Exam  Constitutional: She is oriented to person, place, and time. She appears well-developed and well-nourished. No distress.  HENT:  Head: Normocephalic and atraumatic.  Cardiovascular: Normal rate, regular rhythm, normal heart sounds and intact distal pulses.  Pulmonary/Chest: Effort normal and breath sounds normal. No respiratory distress.  Musculoskeletal: Normal range of motion.  Neurological: She is alert and oriented to person, place, and time. No cranial nerve deficit.  Skin: Skin is warm and dry.  Psychiatric: She has a normal mood and affect.    Labs reviewed: Basic Metabolic Panel:  Recent Labs  09/07/15 02/12/16 1228 04/11/16 0940  NA 140 142 141  K 4.0 4.2 4.3  CL  --   --  107  CO2  --  24 27  GLUCOSE  --  82 85  BUN 11 11.6 13  CREATININE 0.8 0.8 0.87  CALCIUM  --  8.5 8.5*  TSH 8.75*  --  1.64   Liver Function Tests:  Recent Labs  09/07/15 02/12/16 1228 04/11/16 0940  AST 20 18 17   ALT 16 12 14   ALKPHOS 35 35* 30*  BILITOT  --  0.38 0.5  PROT  --  6.6 6.3  ALBUMIN  --  3.4* 3.8   No results for input(s): LIPASE, AMYLASE in the last 8760 hours. No results for input(s): AMMONIA in the last 8760 hours. CBC:  Recent Labs  09/07/15 02/12/16 1228 04/11/16 0940  WBC 4.6 4.4 3.9  NEUTROABS  --  2.3 2,145  HGB 13.2 12.8 13.1  HCT 40 38.8 40.3  MCV  --  93.8 95.5  PLT 192 205 197    Assessment/Plan 1. Acute pain of right knee - new in the past month - knee is lax and hurts posteriorly and into calf at times - DG Knee Complete 4 Views Right; Future  2. Joint laxity of right knee -supportive brace also prescribed and advised for her to get it at [place] discount medical supply - DG Knee Complete 4 Views Right; Future  3. Hot flashes due to tamoxifen -can't wait to come off of it for this reason--improved with gabapentin -discussed risks of worsening bone density on the medication and  consideration of prolia therapy--wants to wait until she is off the tamoxifen and go from there -cont weightbearing exercise with walking and her ca with D  4. Osteopenia of multiple sites - see above  5. Primary osteoarthritis involving multiple joints - thumbs, hips, and now right knee -recommended tylenol, topicals, ice, heat and continued mobility  6. Malignant neoplasm of upper-outer quadrant of left female breast, unspecified estrogen receptor status ([place]) -completing last 6 mos for her tamoxifen--said today that she's not sure she ever really had cancer??? B/c of mass on her mammogram being there 6 yrs before it was addressed  7. Acquired hypothyroidism -TSH and free t4 now normal--cont 66mcg dosage and monitor  Pt wants her cholesterol checked--not sure if medicare will cover this (don't cover screening)  Labs/tests ordered:   Orders Placed This Encounter  Procedures  . DG Knee Complete 4 Views Right    Standing Status:   Future    Standing Expiration Date:   06/16/2017    Order Specific Question:   Reason for Exam (SYMPTOM  OR DIAGNOSIS REQUIRED)    Answer:   right knee pain, wants to give out at times, pain into posterior calf    Order Specific Question:   Preferred imaging location?    Answer:   [facility]    Order Specific Question:   Call Results- Best Contact Number?    AnswerBY:[phone]    Next appt:  6 mos for med mgt, come fasting for lipid panel  Yilia Sacca L. Mariea Clonts,  D.O. [facility] [phone], [place] [address] Cell Phone (Mon-Fri 8am-5pm):  [phone] On Call:  [phone] & follow prompts after 5pm & weekends Office Phone:  [phone] Office Fax:  [phone]

## 2016-04-16 ENCOUNTER — Other Ambulatory Visit: Payer: Self-pay | Admitting: *Deleted

## 2016-04-16 ENCOUNTER — Ambulatory Visit
Admission: RE | Admit: 2016-04-16 | Discharge: 2016-04-16 | Disposition: A | Discharge status: Home / Self Care | Payer: Medicare Other | Source: Ambulatory Visit | Attending: Internal Medicine | Admitting: Internal Medicine

## 2016-04-16 DIAGNOSIS — M238X1 Other internal derangements of right knee: Secondary | ICD-10-CM

## 2016-04-16 DIAGNOSIS — M25561 Pain in right knee: Secondary | ICD-10-CM

## 2016-04-21 ENCOUNTER — Other Ambulatory Visit: Payer: Self-pay | Admitting: Internal Medicine

## 2016-06-28 ENCOUNTER — Telehealth: Payer: Self-pay | Admitting: Oncology

## 2016-06-28 NOTE — Telephone Encounter (Signed)
R/s Patient appt from 3/28 to 4/16 . Was not able to reach patient. Sending reminder letter with new date and time.

## 2016-07-19 ENCOUNTER — Other Ambulatory Visit: Payer: Self-pay | Admitting: *Deleted

## 2016-07-19 MED ORDER — GABAPENTIN 100 MG PO CAPS: ORAL_CAPSULE | ORAL | 5 refills | Status: DC

## 2016-07-24 ENCOUNTER — Ambulatory Visit: Payer: Medicare Other | Admitting: Oncology

## 2016-07-24 ENCOUNTER — Other Ambulatory Visit: Payer: Medicare Other

## 2016-08-12 ENCOUNTER — Other Ambulatory Visit (HOSPITAL_BASED_OUTPATIENT_CLINIC_OR_DEPARTMENT_OTHER): Payer: Medicare Other

## 2016-08-12 ENCOUNTER — Ambulatory Visit (HOSPITAL_BASED_OUTPATIENT_CLINIC_OR_DEPARTMENT_OTHER): Payer: Medicare Other | Admitting: Oncology

## 2016-08-12 VITALS — BP 123/58 | HR 68 | Temp 98.0°F | Wt 153.9 lb

## 2016-08-12 DIAGNOSIS — Z853 Personal history of malignant neoplasm of breast: Secondary | ICD-10-CM

## 2016-08-12 DIAGNOSIS — M8580 Other specified disorders of bone density and structure, unspecified site: Secondary | ICD-10-CM | POA: Diagnosis not present

## 2016-08-12 DIAGNOSIS — Z170 Estrogen receptor positive status [ER+]: Principal | ICD-10-CM

## 2016-08-12 DIAGNOSIS — C50412 Malignant neoplasm of upper-outer quadrant of left female breast: Secondary | ICD-10-CM

## 2016-08-12 LAB — CBC WITH DIFFERENTIAL/PLATELET
BASO%: 0.7 % (ref 0.0–2.0)
BASOS ABS: 0 10*3/uL (ref 0.0–0.1)
EOS ABS: 0.1 10*3/uL (ref 0.0–0.5)
EOS%: 1.3 % (ref 0.0–7.0)
HEMATOCRIT: 39.8 % (ref 34.8–46.6)
HEMOGLOBIN: 13.7 g/dL (ref 11.6–15.9)
LYMPH%: 36.4 % (ref 14.0–49.7)
MCH: 31.8 pg (ref 25.1–34.0)
MCHC: 34.4 g/dL (ref 31.5–36.0)
MCV: 92.5 fL (ref 79.5–101.0)
MONO#: 0.3 10*3/uL (ref 0.1–0.9)
MONO%: 8.1 % (ref 0.0–14.0)
NEUT#: 2.3 10*3/uL (ref 1.5–6.5)
NEUT%: 53.5 % (ref 38.4–76.8)
PLATELETS: 195 10*3/uL (ref 145–400)
RBC: 4.3 10*6/uL (ref 3.70–5.45)
RDW: 13.4 % (ref 11.2–14.5)
WBC: 4.2 10*3/uL (ref 3.9–10.3)
lymph#: 1.5 10*3/uL (ref 0.9–3.3)

## 2016-08-12 LAB — COMPREHENSIVE METABOLIC PANEL
ALBUMIN: 3.9 g/dL (ref 3.5–5.0)
ALK PHOS: 42 U/L (ref 40–150)
ALT: 21 U/L (ref 0–55)
ANION GAP: 8 meq/L (ref 3–11)
AST: 22 U/L (ref 5–34)
BILIRUBIN TOTAL: 0.43 mg/dL (ref 0.20–1.20)
BUN: 12.3 mg/dL (ref 7.0–26.0)
CALCIUM: 8.8 mg/dL (ref 8.4–10.4)
CHLORIDE: 108 meq/L (ref 98–109)
CO2: 25 mEq/L (ref 22–29)
Creatinine: 0.8 mg/dL (ref 0.6–1.1)
EGFR: 69 mL/min/{1.73_m2} — ABNORMAL LOW (ref >90)
Glucose: 88 mg/dl (ref 70–140)
Potassium: 4.4 mEq/L (ref 3.5–5.1)
Sodium: 141 mEq/L (ref 136–145)
Total Protein: 6.9 g/dL (ref 6.4–8.3)

## 2016-08-12 MED ORDER — GABAPENTIN 100 MG PO CAPS: ORAL_CAPSULE | ORAL | 5 refills | Status: DC

## 2016-08-12 NOTE — Progress Notes (Signed)
ID: Sylvia [name]   DOB: 1945-07-27  MR#: [id number]  YOV#:[id number]  PCP: Hollace Kinnier, DO GYN: Dr. Tracie Harrier SU: Dr. Donne Hazel OTHER MD:  CHIEF COMPLAINT: Ductal carcinoma in situ  CURRENT TREATMENT: Tamoxifen  BREAST CANCER HISTORY: From the original intake note:   71 year old [place] woman, who had a screening mammogram on 06/19/2011, which showed a 1 cm mass lateral aspect of the left breast. Ultrasound confirmed the presence of a 1.3 x 1 x 0.8 cm lobulated well-circumscribed mass at 3:00 4 cm from the nipple. 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 HER-2 was negative the ratio of 1.03. Bilateral MRI scan performed 07/01/2011 showed bilateral multinodular parenchymal enhancement with a 2 x 1.5 x 1.5 cm irregular enhancing mass left breast at the 2:00 position with corresponding to the recently biopsied mass. Initial enhancing mass was seen in the upper-outer quadrant of the left breast with a second look ultrasound recommended. In addition, in the right breast a 1.3 cm area of nodular clumped linear enhancement was located in the superior subareolar portion of the right breast. A subsequent biopsy of the right breast on 07/02/11 showed an intraductal papilloma with microcalcifications.  On 07/26/11, she underwent a left and right lumpectomy with left axillary sentinel lymph node biopsy by Dr. Donne Hazel.  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  Two examined lymph nodes were negative.  She was started on Tamoxifen in April of 2013.  Her subsequent history is as detailed below   INTERVAL HISTORY:  Sylvia [name] returns today for follow-up of her estrogen receptor positive breast cancer. She continues on tamoxifen with excellent tolerance. She does have some hot flashes, which particularly  bother her at night. She does well with Neurontin for that. Vaginal wetness is a minor issue. She obtains the drug at no cost.  She is very concerned about her husband's situation. She says the appearing under treatment well.   REVIEW OF SYSTEMS: Sylvia [name] walks about a mile and a half at least 3 days a week. A detailed review of systems today was otherwise noncontributory  PAST MEDICAL HISTORY: Past Medical History:  Diagnosis Date  . Aphasia   . Arthritis    OA- fingers, toes, back pain, hip pain   . Breast cancer of upper-outer quadrant of left female breast ([place])    left breast cancer  . Complication of anesthesia    slow to wake up  . Disorder of bone and cartilage, unspecified   . Hypothyroid   . Lumbago   . Malignant neoplasm of breast (female), unspecified site   . Normal nuclear stress test    [facility], 07/18/2011  . Osteoarthritis (arthritis due to wear and tear of joints)   . Pain in joint, pelvic region and thigh   . Reflux esophagitis    pt. knowledgeable about dietary intake relative to reflux  . S/P bilateral breast biopsy 2013  . Shortness of breath    /w housework  . Symptomatic menopausal or female climacteric states     PAST SURGICAL HISTORY: Past Surgical History:  Procedure Laterality Date  . BREAST BIOPSY  07/26/2011   Procedure: BREAST BIOPSY WITH NEEDLE LOCALIZATION;  Surgeon: Rolm Bookbinder, MD;  Location: [place];  Service: General;  Laterality: Right;  Right breast wire guided biospy Needle localization @ [facility]  7:30  . BREAST SURGERY  Procedure: BREAST BIOPSY WITH NEEDLE LOCALIZATION;  [place]  . CATARACT EXTRACTION  2012  . DILATION AND CURETTAGE OF UTERUS  1978  . ECTOPIC PREGNANCY SURGERY     1980  . EYE SURGERY     R cat. extracted /w IOL  . TONSILLECTOMY     as a child    FAMILY HISTORY Family History  Problem Relation Age of Onset  . Cancer Sister     colon  . Cancer Brother     renal  . Heart disease Brother   . Cancer  Mother     leukemia  . Cancer Father     lung  . Heart attack Brother   . Cancer Maternal Aunt     breast  . Cancer Cousin   . Cancer Cousin   . 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  She lives with her husband.  Her daughter lives next door to her.    ADVANCED DIRECTIVES:  Living will and Healthcare POA  HEALTH MAINTENANCE: Social History  Substance Use Topics  . Smoking status: Former Smoker    Packs/day: 0.25    Quit date: 07/22/1980  . Smokeless tobacco: Never Used  . Alcohol use No    Mammogram: 07/29/11  Colonoscopy: 04/2012  PAP:  03/10/12  Bone density:  07/09/11 with low bone mass  Lipid panel:  Managed by Dr. Mariea Clonts  Allergies  Allergen Reactions  . Benadryl [Diphenhydramine Hcl] Other (See Comments)    "Super hyper"  . Tape     Skin breakdown/redness   . Boniva [Ibandronate Sodium] Other (See Comments)    Neck, shoulder limited mobility   . Chlorhexidine Gluconate   . Peridin-C [Ascorbic Acid]   . Venlafaxine   . Codeine Other (See Comments)    "can't wake up from it."  . Sulfa Antibiotics Rash    Patient unsure of reaction    Current Outpatient Prescriptions  Medication Sig Dispense Refill  . Calcium Carbonate-Vitamin D (CALCIUM + D PO) Take 600 mg by mouth 2 (two) times daily.    . cholecalciferol (VITAMIN D) 1000 UNITS tablet Take 1,000 Units by mouth 2 (two) times daily.    Marland Kitchen gabapentin (NEURONTIN) 100 MG capsule TAKE ONE TO TWO CAPSULES BY MOUTH AT BEDTIME 60 capsule 5  . ibuprofen (ADVIL,MOTRIN) 100 MG tablet Take 400 mg by mouth every 6 (six) hours as needed. For pain    . SYNTHROID 88 MCG tablet TAKE 1 TABLET (88 MCG TOTAL) BY MOUTH DAILY BEFORE BREAKFAST. 30 tablet 3  . vitamin E 400 UNIT capsule Take 400 Units by mouth 2 (two) times daily.    . VOLTAREN 1 % GEL Ad  lib.     No current facility-administered medications for this visit.     OBJECTIVE: Middle-aged white woman Who appears well   Vitals:   08/12/16 1350  BP: (!) 123/58  Pulse: 68  Temp: 98 F (36.7 C)     Body mass index is 28.15 kg/m.    ECOG FS:  0  Sclerae unicteric, EOMs intact Oropharynx clear and moist No cervical or supraclavicular adenopathy Lungs no rales or rhonchi Heart regular rate and rhythm Abd soft, nontender, positive bowel sounds MSK no focal spinal tenderness, no upper extremity lymphedema Neuro: nonfocal, well oriented, appropriate affect Breasts: Both breasts  are status post lumpectomy. The cosmetic result is excellent. There is no evidence of local recurrence. Both axillae are benign.    LAB RESULTS: Lab Results  Component Value Date   WBC 4.2 08/12/2016   NEUTROABS 2.3 08/12/2016   HGB 13.7 08/12/2016   HCT 39.8 08/12/2016   MCV 92.5 08/12/2016   PLT 195 08/12/2016      Chemistry      Component Value Date/Time   NA 141 08/12/2016 1335   K 4.4 08/12/2016 1335   CL 107 04/11/2016 0940   CL 107 06/24/2012 1207   CO2 25 08/12/2016 1335   BUN 12.3 08/12/2016 1335   CREATININE 0.8 08/12/2016 1335   GLU 90 09/07/2015      Component Value Date/Time   CALCIUM 8.8 08/12/2016 1335   ALKPHOS 42 08/12/2016 1335   AST 22 08/12/2016 1335   ALT 21 08/12/2016 1335   BILITOT 0.43 08/12/2016 1335       Lab Results  Component Value Date   LABCA2 10 07/03/2011    No components found for: [id number]  No results for input(s): INR in the last 168 hours.  Urinalysis No results found for: COLORURINE  STUDIES: Results of recent bone density are pending  ASSESSMENT: 71 y.o. [place] woman Status post left breast upper outer quadrant biopsy 06/24/2011 for what proved to be an adenomyoepithelioma, possibly developing into an invasive ductal carcinoma, estrogen receptor positive, progesterone receptor and HER-2 negative, with an MIB-1 of 12%  #1   S/P  left upper outer quadrant lumpectomy 07/26/11 for Tis N0 , ductal carcinoma in situ low grade, Stage 0, with negative margins  (a) an intraductal papilloma with the right breast was removed on the same date  #2  She was started on Tamoxifen in April of 2013.  #3 osteopenia with a T score of -1.8 on DEXA scan at [facility] 08/05/2013.  PLAN:  Sylvia [name] is now just over 5 years out from her definitive surgery for breast cancer with no evidence of disease recurrence. This is very favorable.  She also is completing 5 years of tamoxifen this month. We discussed the fact that some patients do take an additional 5 years mostly those would be people at high risk of recurrence either because of large aggressive tumors or positive lymph nodes.  In her case I think continuing tamoxifen beyond 5 years with minimally if at all affect her risk.  We also discussed her bone density which shows sinus essentially stable osteopenia. I encouraged her continuing her vitamin D and her walking program.  At this point I am comfortable with her stopping tamoxifen and I am releasing her back to her primary care physician. All she needs in terms of breast cancer follow-up is yearly mammography and a yearly physician breast exam  I will be glad to see Sylvia [name] at any point in the future if on when the need arises as of now were making no routine appointment for her here    Chauncey Cruel    08/12/2016

## 2016-08-14 ENCOUNTER — Other Ambulatory Visit: Payer: Self-pay | Admitting: Internal Medicine

## 2016-10-14 ENCOUNTER — Encounter: Payer: Self-pay | Admitting: Internal Medicine

## 2016-10-14 ENCOUNTER — Ambulatory Visit (INDEPENDENT_AMBULATORY_CARE_PROVIDER_SITE_OTHER): Payer: Medicare Other | Admitting: Internal Medicine

## 2016-10-14 VITALS — BP 120/80 | HR 67 | Temp 98.6°F | Wt 150.0 lb

## 2016-10-14 DIAGNOSIS — Z853 Personal history of malignant neoplasm of breast: Secondary | ICD-10-CM | POA: Diagnosis not present

## 2016-10-14 DIAGNOSIS — N951 Menopausal and female climacteric states: Secondary | ICD-10-CM | POA: Diagnosis not present

## 2016-10-14 DIAGNOSIS — E039 Hypothyroidism, unspecified: Secondary | ICD-10-CM

## 2016-10-14 DIAGNOSIS — Z8619 Personal history of other infectious and parasitic diseases: Secondary | ICD-10-CM

## 2016-10-14 DIAGNOSIS — Z800 Family history of malignant neoplasm of digestive organs: Secondary | ICD-10-CM | POA: Diagnosis not present

## 2016-10-14 DIAGNOSIS — K635 Polyp of colon: Secondary | ICD-10-CM | POA: Diagnosis not present

## 2016-10-14 DIAGNOSIS — E663 Overweight: Secondary | ICD-10-CM | POA: Diagnosis not present

## 2016-10-14 DIAGNOSIS — M8589 Other specified disorders of bone density and structure, multiple sites: Secondary | ICD-10-CM | POA: Diagnosis not present

## 2016-10-14 LAB — LIPID PANEL
Cholesterol: 170 mg/dL (ref <200)
HDL: 57 mg/dL (ref >50)
LDL Cholesterol: 86 mg/dL (ref <100)
Total CHOL/HDL Ratio: 3 Ratio (ref <5.0)
Triglycerides: 133 mg/dL (ref <150)
VLDL: 27 mg/dL (ref <30)

## 2016-10-14 LAB — T4, FREE: Free T4: 1.5 ng/dL (ref 0.8–1.8)

## 2016-10-14 LAB — TSH: TSH: 0.22 mIU/L — ABNORMAL LOW

## 2016-10-14 MED ORDER — ZOSTER VAC RECOMB ADJUVANTED 50 MCG/0.5ML IM SUSR: 0.5000 mL | Freq: Once | INTRAMUSCULAR | 1 refills | Status: AC

## 2016-10-14 NOTE — Progress Notes (Signed)
Location:  [facility] clinic Provider:  Keison Glendinning L. Mariea Clonts, D.O., C.M.D.  Goals of Care:  Advanced Directives 10/14/2016  Does Patient Have a Medical Advance Directive? Yes  Type of Advance Directive Living will  Does patient want to make changes to medical advance directive? No - Patient declined  Copy of Shel[place] in Chart? -  Would patient like information on creating a medical advance directive? No - Patient declined   Chief Complaint  Patient presents with  . Medical Management of Chronic Issues    22mth follow-up    HPI: Patient is a 71 y.o. female seen today for medical management of chronic diseases.    She has lost some weight.  Her daughter was going to sign up for weight watchers--she didn't want to go to meetings and couldn't do the online stuff.  She writes down everything she eats and she's able to tell when she's overdone.  She was 154 here and now 150lbs.  She had been even more at home before.  Watching what she eats and walking about 1.25 miles early in the am.    Discussed shingrix and sent Rx to pharmacy.    Right knee pain--bought some braces--used 1-2 times and she's been fine since.    Still has hot flashes and taking gabapentin.  Does help.  Husband is doing better.  Stopped treatment in feb.  Returns in July.  Numbers were looking good but numbers were pretty good to begin with.  PETs had shown shrunken tumors.    No difficulties with breast cancer.  Off of tamoxifen.  Last mammo in 10/17.  Due 10/18.    Did have colon polyps in 11/13--Dr. Michail Sermon at [place].  Should be getting f/u cscope in November of this year.    Past Medical History:  Diagnosis Date  . Aphasia   . Arthritis    OA- fingers, toes, back pain, hip pain   . Breast cancer of upper-outer quadrant of left female breast ([place])    left breast cancer  . Complication of anesthesia    slow to wake up  . Disorder of bone and cartilage, unspecified   . Hypothyroid   . Lumbago   .  Malignant neoplasm of breast (female), unspecified site   . Normal nuclear stress test    [facility], 07/18/2011  . Osteoarthritis (arthritis due to wear and tear of joints)   . Pain in joint, pelvic region and thigh   . Reflux esophagitis    pt. knowledgeable about dietary intake relative to reflux  . S/P bilateral breast biopsy 2013  . Shortness of breath    /w housework  . Symptomatic menopausal or female climacteric states     Past Surgical History:  Procedure Laterality Date  . BREAST BIOPSY  07/26/2011   Procedure: BREAST BIOPSY WITH NEEDLE LOCALIZATION;  Surgeon: Rolm Bookbinder, MD;  Location: [place];  Service: General;  Laterality: Right;  Right breast wire guided biospy Needle localization @ [facility]  7:30  . BREAST SURGERY     Procedure: BREAST BIOPSY WITH NEEDLE LOCALIZATION;  [place]  . CATARACT EXTRACTION  2012  . DILATION AND CURETTAGE OF UTERUS  1978  . ECTOPIC PREGNANCY SURGERY     1980  . EYE SURGERY     R cat. extracted /w IOL  . TONSILLECTOMY     as a child    Allergies  Allergen Reactions  . Benadryl [Diphenhydramine Hcl] Other (See Comments)    "Super hyper"  .  Tape     Skin breakdown/redness   . Boniva [Ibandronate Sodium] Other (See Comments)    Neck, shoulder limited mobility   . Chlorhexidine Gluconate   . Peridin-C [Ascorbic Acid]   . Venlafaxine   . Codeine Other (See Comments)    "can't wake up from it."  . Sulfa Antibiotics Rash    Patient unsure of reaction    Allergies as of 10/14/2016      Reactions   Benadryl [diphenhydramine Hcl] Other (See Comments)   "Super hyper"   Tape    Skin breakdown/redness   Boniva [ibandronate Sodium] Other (See Comments)   Neck, shoulder limited mobility    Chlorhexidine Gluconate    Peridin-c [ascorbic Acid]    Venlafaxine    Codeine Other (See Comments)   "can't wake up from it."   Sulfa Antibiotics Rash   Patient unsure of reaction      Medication List       Accurate as of 10/14/16 10:02 AM.  Always use your most recent med list.          CALCIUM + D PO Take 600 mg by mouth 2 (two) times daily.   cholecalciferol 1000 units tablet Commonly known as:  VITAMIN D Take 1,000 Units by mouth 2 (two) times daily.   gabapentin 100 MG capsule Commonly known as:  NEURONTIN TAKE ONE TO TWO CAPSULES BY MOUTH AT BEDTIME   ibuprofen 100 MG tablet Commonly known as:  ADVIL,MOTRIN Take 400 mg by mouth every 6 (six) hours as needed. For pain   SYNTHROID 88 MCG tablet Generic drug:  levothyroxine TAKE 1 TABLET (88 MCG TOTAL) BY MOUTH DAILY BEFORE BREAKFAST.   vitamin E 400 UNIT capsule Take 400 Units by mouth 2 (two) times daily.   VOLTAREN 1 % Gel Generic drug:  diclofenac sodium Ad lib.       Review of Systems:  Review of Systems  Constitutional: Negative for chills, fever and malaise/fatigue.  HENT: Negative for congestion and hearing loss.   Eyes: Negative for blurred vision.  Respiratory: Negative for cough and shortness of breath.   Cardiovascular: Negative for chest pain and palpitations.  Gastrointestinal: Positive for constipation. Negative for abdominal pain, blood in stool, diarrhea and melena.  Genitourinary: Negative for dysuria, frequency and urgency.  Musculoskeletal: Negative for falls and joint pain.  Skin: Negative for itching and rash.  Neurological: Negative for dizziness, loss of consciousness and weakness.  Endo/Heme/Allergies:       Hot flashes  Psychiatric/Behavioral: Negative for depression and memory loss.    Health Maintenance  Topic Date Due  . INFLUENZA VACCINE  11/27/2016  . COLONOSCOPY  03/23/2017  . MAMMOGRAM  02/07/2018  . TETANUS/TDAP  04/16/2023  . DEXA SCAN  Completed  . Hepatitis C Screening  Completed  . PNA vac Low Risk Adult  Completed    Physical Exam: Vitals:   10/14/16 0942  BP: 120/80  Pulse: 67  Temp: 98.6 F (37 C)  TempSrc: Oral  SpO2: 97%  Weight: 150 lb (68 kg)   Body mass index is 27.44  kg/m. Physical Exam  Constitutional: She is oriented to person, place, and time. She appears well-developed and well-nourished. No distress.  Cardiovascular: Normal rate, regular rhythm, normal heart sounds and intact distal pulses.   Few pvcs heard  Pulmonary/Chest: Effort normal and breath sounds normal. No respiratory distress.  Abdominal: Soft. Bowel sounds are normal. She exhibits no distension. There is no tenderness.  Musculoskeletal: Normal range of motion.  She exhibits no tenderness.  Neurological: She is alert and oriented to person, place, and time.  Skin: Skin is warm and dry.  Psychiatric: She has a normal mood and affect.    Labs reviewed: Basic Metabolic Panel:  Recent Labs  02/12/16 1228 04/11/16 0940 08/12/16 1335  NA 142 141 141  K 4.2 4.3 4.4  CL  --  107  --   CO2 24 27 25   GLUCOSE 82 85 88  BUN 11.6 13 12.3  CREATININE 0.8 0.87 0.8  CALCIUM 8.5 8.5* 8.8  TSH  --  1.64  --    Liver Function Tests:  Recent Labs  02/12/16 1228 04/11/16 0940 08/12/16 1335  AST 18 17 22   ALT 12 14 21   ALKPHOS 35* 30* 42  BILITOT 0.38 0.5 0.43  PROT 6.6 6.3 6.9  ALBUMIN 3.4* 3.8 3.9   No results for input(s): LIPASE, AMYLASE in the last 8760 hours. No results for input(s): AMMONIA in the last 8760 hours. CBC:  Recent Labs  02/12/16 1228 04/11/16 0940 08/12/16 1335  WBC 4.4 3.9 4.2  NEUTROABS 2.3 2,145 2.3  HGB 12.8 13.1 13.7  HCT 38.8 40.3 39.8  MCV 93.8 95.5 92.5  PLT 205 197 195    Assessment/Plan 1. History of shingles - needs shingrix series--sent to [facility] - Zoster Vac Recomb Adjuvanted [facility]) injection; Inject 0.5 mLs into the muscle once.  Dispense: 0.5 mL; Refill: 1  2. Acquired hypothyroidism - continue levothyroxine - TSH - T4, free  3. Overweight (BMI 25.0-29.9) - cont to work on diet and exercise--has lost some weight since last time - Lipid panel  4. Osteopenia of multiple sites -cont vitamin D therapy and weightbearing  exercise (walking)  5. Symptomatic menopausal or female climacteric states -continues on gabapentin for hot flashes with benefit, unfortunately still gets these  6. History of breast cancer in adulthood -now off tamoxifen, no longer following with oncology, will need mammogram done in October for annual screening  7. Hyperplastic colonic polyp, unspecified part of colon -added to problem list, due for cscope in November due to these, also sister passed away from colon ca  8. Family history of colon cancer -see #7, added to problem list  Labs/tests ordered:   Orders Placed This Encounter  Procedures  . MM Digital Diagnostic Bilat    Standing Status:   Future    Standing Expiration Date:   12/14/2017    Order Specific Question:   Reason for Exam (SYMPTOM  OR DIAGNOSIS REQUIRED)    Answer:   annual, h/o breast cancer    Order Specific Question:   Preferred imaging location?    Answer:   External    Comments:   [facility]  . Lipid panel    Order Specific Question:   Has the patient fasted?    Answer:   Yes  . TSH  . T4, free    Next appt:  6 mos med mgt   Lars Jeziorski L. Adleigh Mcmasters, D.O. [facility] [address], [place] [address] Cell Phone (Mon-Fri 8am-5pm):  [phone] On Call:  [phone] & follow prompts after 5pm & weekends Office Phone:  [phone] Office Fax:  [phone]

## 2016-10-17 ENCOUNTER — Other Ambulatory Visit: Payer: Self-pay | Admitting: Internal Medicine

## 2016-10-17 ENCOUNTER — Encounter: Payer: Self-pay | Admitting: *Deleted

## 2016-10-17 DIAGNOSIS — E039 Hypothyroidism, unspecified: Secondary | ICD-10-CM

## 2016-11-11 ENCOUNTER — Other Ambulatory Visit: Payer: Self-pay | Admitting: Internal Medicine

## 2016-11-25 ENCOUNTER — Encounter: Payer: Self-pay | Admitting: Internal Medicine

## 2017-01-12 IMAGING — CR DG KNEE COMPLETE 4+V*R*
4 series · 4 of 4 positions shown · non-contrast
Comparison: None.

CLINICAL DATA: 70-year-old female with 1 month of right knee pain.
Patella and popliteal region pain. Initial encounter.

EXAM:
RIGHT KNEE - COMPLETE 4+ VIEW

[w knee ap right]
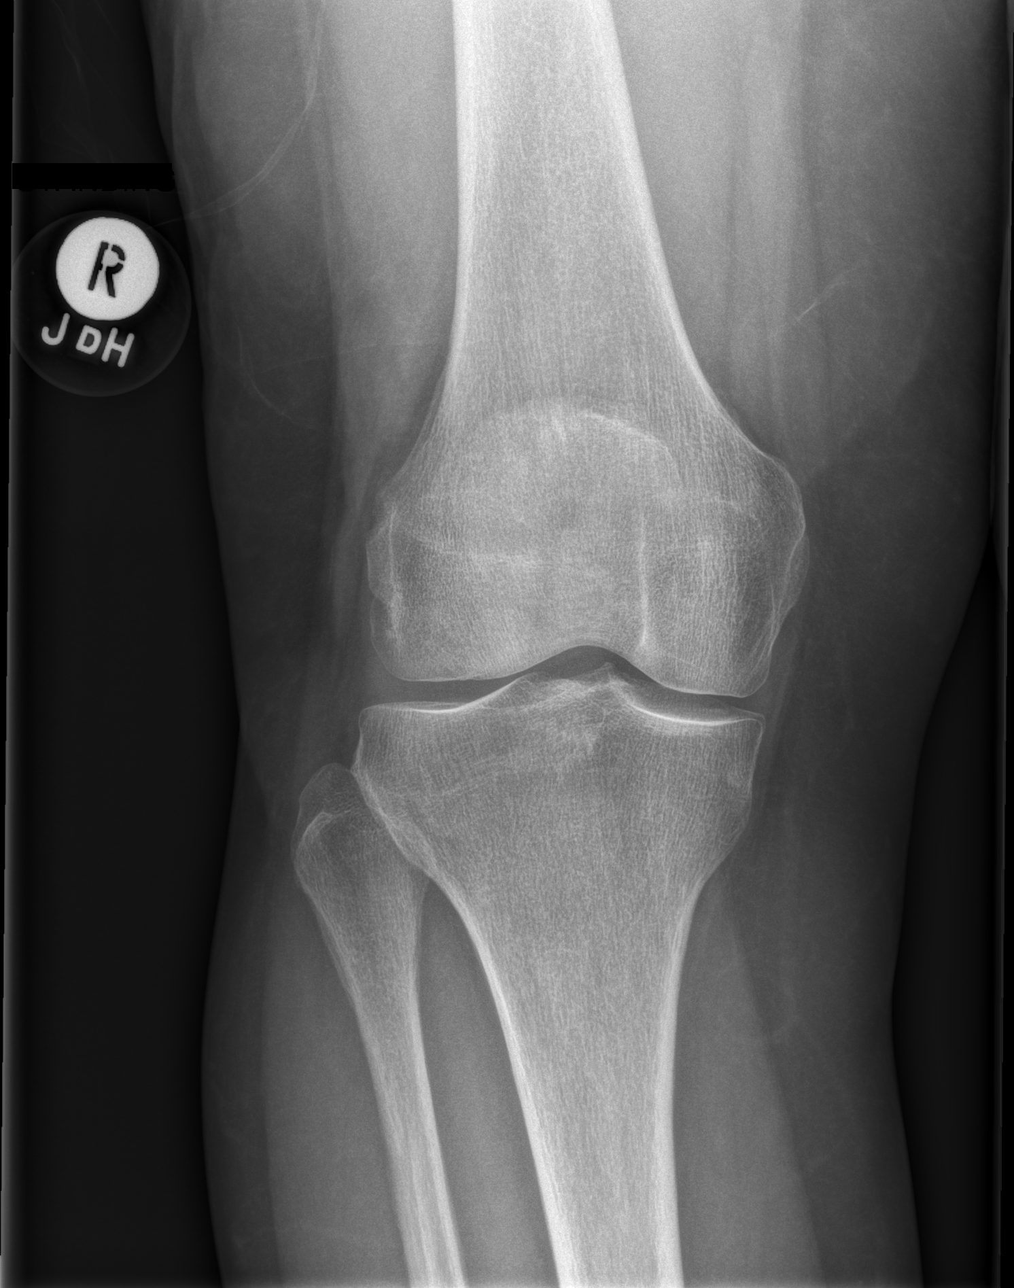

[w knee lat right]
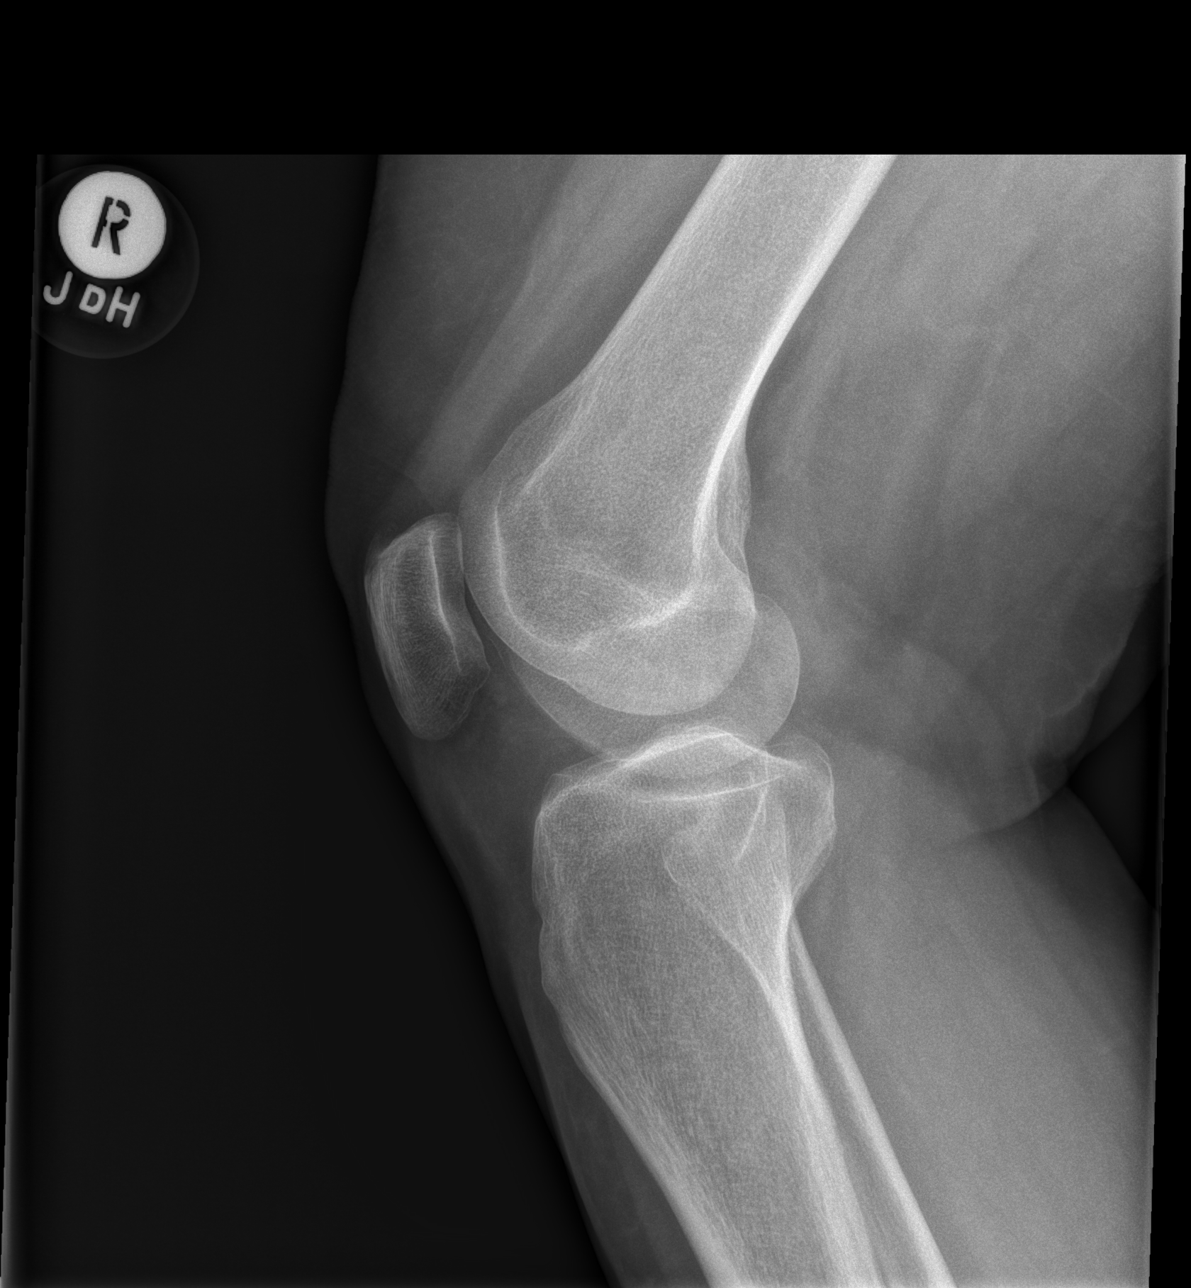

[x knee tunnel right]
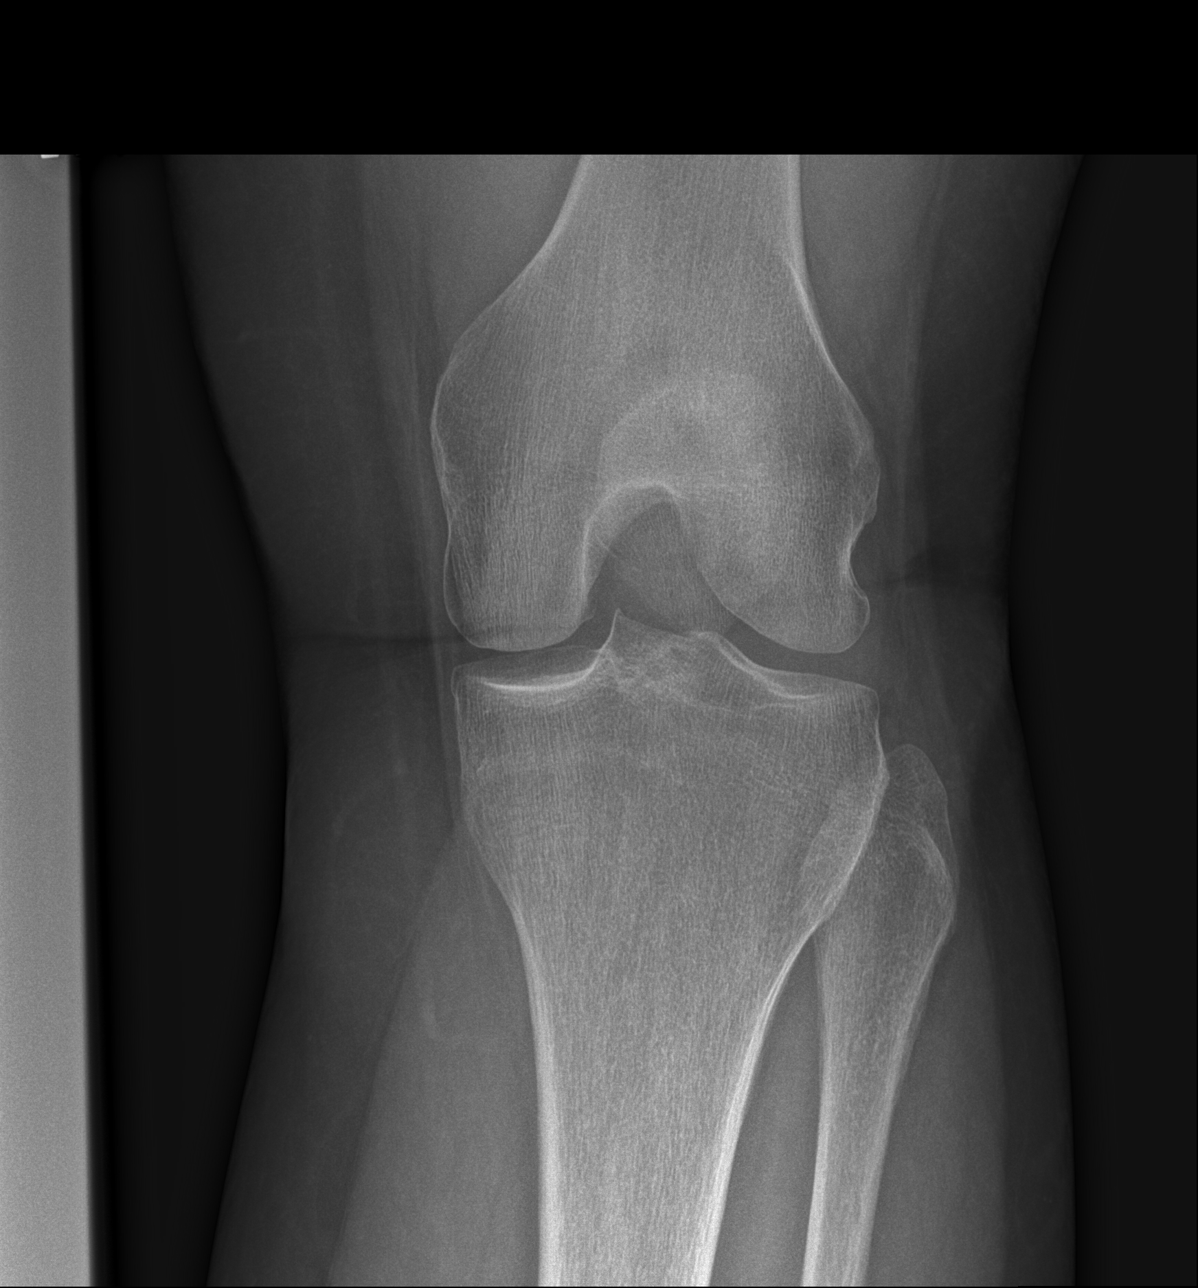

[x knee sunrise right]
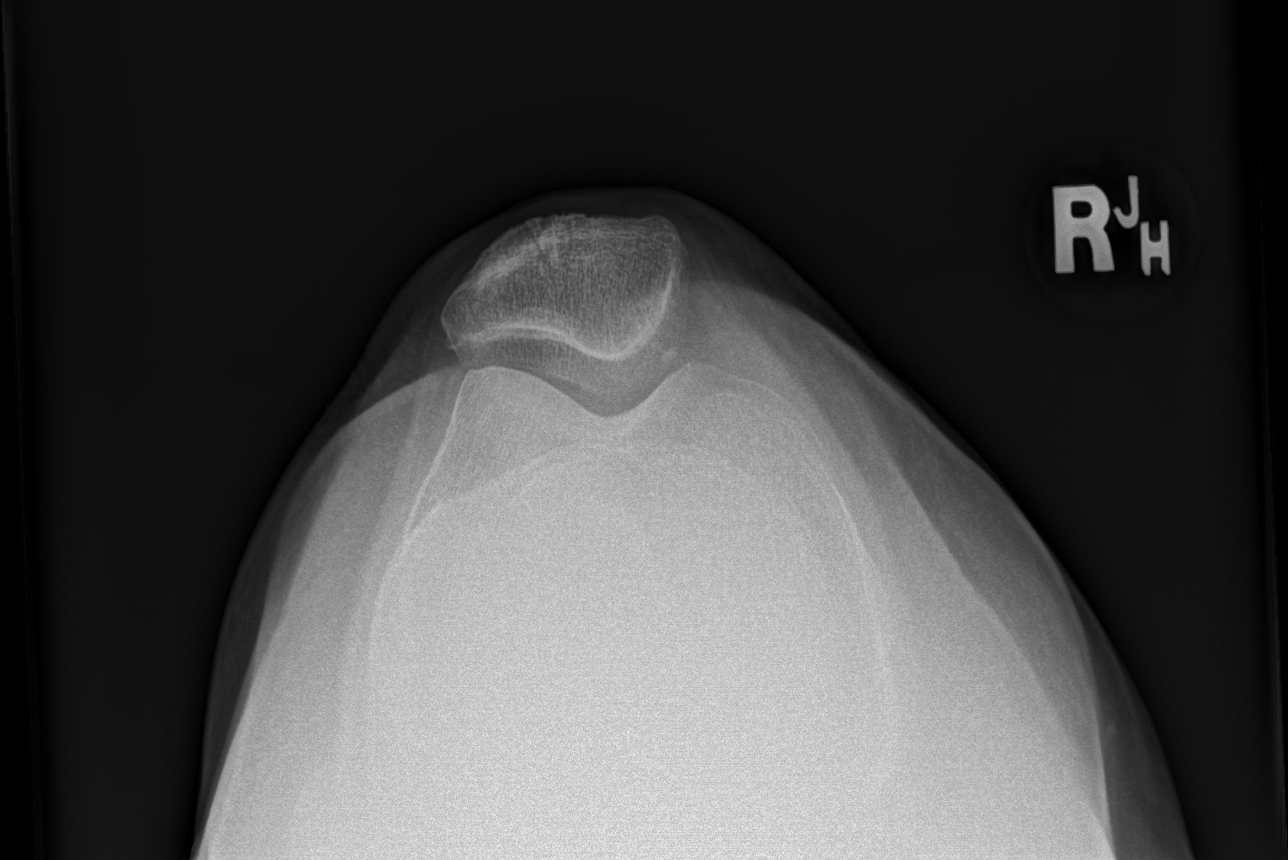

[4 of 4 positions shown; findings below may reference images not displayed]

FINDINGS: Weight-bearing views. Joint spaces appear normal for age. Patella
intact. No joint effusion identified. Bone mineralization is within
normal limits. No acute osseous abnormality identified.
IMPRESSION: Normal for age radiographic appearance of the right knee.

## 2017-02-05 ENCOUNTER — Other Ambulatory Visit: Payer: Self-pay | Admitting: Internal Medicine

## 2017-02-10 DIAGNOSIS — Z1231 Encounter for screening mammogram for malignant neoplasm of breast: Secondary | ICD-10-CM | POA: Diagnosis not present

## 2017-02-10 DIAGNOSIS — Z23 Encounter for immunization: Secondary | ICD-10-CM | POA: Diagnosis not present

## 2017-02-10 DIAGNOSIS — Z853 Personal history of malignant neoplasm of breast: Secondary | ICD-10-CM | POA: Diagnosis not present

## 2017-02-10 LAB — HM MAMMOGRAPHY

## 2017-02-11 ENCOUNTER — Encounter: Payer: Self-pay | Admitting: *Deleted

## 2017-02-17 ENCOUNTER — Encounter: Payer: Self-pay | Admitting: Internal Medicine

## 2017-03-17 DIAGNOSIS — Z01411 Encounter for gynecological examination (general) (routine) with abnormal findings: Secondary | ICD-10-CM | POA: Diagnosis not present

## 2017-03-17 DIAGNOSIS — Z6828 Body mass index (BMI) 28.0-28.9, adult: Secondary | ICD-10-CM | POA: Diagnosis not present

## 2017-03-17 DIAGNOSIS — C50919 Malignant neoplasm of unspecified site of unspecified female breast: Secondary | ICD-10-CM | POA: Diagnosis not present

## 2017-03-17 DIAGNOSIS — Z124 Encounter for screening for malignant neoplasm of cervix: Secondary | ICD-10-CM | POA: Diagnosis not present

## 2017-03-18 LAB — HM PAP SMEAR: HM PAP: NORMAL

## 2017-03-24 ENCOUNTER — Encounter: Payer: Self-pay | Admitting: Internal Medicine

## 2017-04-08 ENCOUNTER — Other Ambulatory Visit: Payer: Self-pay

## 2017-04-10 ENCOUNTER — Other Ambulatory Visit: Payer: Medicare Other

## 2017-04-11 ENCOUNTER — Ambulatory Visit (INDEPENDENT_AMBULATORY_CARE_PROVIDER_SITE_OTHER): Payer: Medicare Other

## 2017-04-11 ENCOUNTER — Other Ambulatory Visit: Payer: Self-pay

## 2017-04-11 VITALS — BP 122/78 | HR 65 | Temp 98.3°F | Ht 62.0 in | Wt 154.0 lb

## 2017-04-11 DIAGNOSIS — E039 Hypothyroidism, unspecified: Secondary | ICD-10-CM

## 2017-04-11 DIAGNOSIS — Z0000 Encounter for general adult medical examination without abnormal findings: Secondary | ICD-10-CM | POA: Diagnosis not present

## 2017-04-11 LAB — TSH: TSH: 0.73 mIU/L (ref 0.40–4.50)

## 2017-04-11 LAB — T4, FREE: Free T4: 1.2 ng/dL (ref 0.8–1.8)

## 2017-04-11 MED ORDER — ZOSTER VAC RECOMB ADJUVANTED 50 MCG/0.5ML IM SUSR: 0.5000 mL | Freq: Once | INTRAMUSCULAR | 1 refills | Status: AC

## 2017-04-11 NOTE — Patient Instructions (Signed)
Sylvia [name] , Thank you for taking time to come for your Medicare Wellness Visit. I appreciate your ongoing commitment to your health goals. Please review the following plan we discussed and let me know if I can assist you in the future.   Screening recommendations/referrals: Colonoscopy due, please reschedule Mammogram up to date. Due 02/12/2019 Bone Density up to date Recommended yearly ophthalmology/optometry visit for glaucoma screening and checkup Recommended yearly dental visit for hygiene and checkup  Vaccinations: Influenza vaccine up to date. Due 2019 fall season Pneumococcal vaccine up to date Tdap vaccine up to date. Due 04/16/2023 Shingles vaccine due, prescription resent to pharmacy    Advanced directives: Please bring [place] a copy of your health care power of attorney  Conditions/risks identified: None  Next appointment: Dr. Mariea [name] 04/14/2017 @ 10:30am                       Sylvia [name] 04/14/2018 @ 10 am   Preventive Care 65 Years and Older, Female Preventive care refers to lifestyle choices and visits with your health care provider that can promote health and wellness. What does preventive care include?  A yearly physical exam. This is also called an annual well check.  Dental exams once or twice a year.  Routine eye exams. Ask your health care provider how often you should have your eyes checked.  Personal lifestyle choices, including:  Daily care of your teeth and gums.  Regular physical activity.  Eating a healthy diet.  Avoiding tobacco and drug use.  Limiting alcohol use.  Practicing safe sex.  Taking low-dose aspirin every day.  Taking vitamin and mineral supplements as recommended by your health care provider. What happens during an annual well check? The services and screenings done by your health care provider during your annual well check will depend on your age, overall health, lifestyle risk factors, and family history of  disease. Counseling  Your health care provider may ask you questions about your:  Alcohol use.  Tobacco use.  Drug use.  Emotional well-being.  Home and relationship well-being.  Sexual activity.  Eating habits.  History of falls.  Memory and ability to understand (cognition).  Work and work Statistician.  Reproductive health. Screening  You may have the following tests or measurements:  Height, weight, and BMI.  Blood pressure.  Lipid and cholesterol levels. These may be checked every 5 years, or more frequently if you are over 51 years old.  Skin check.  Lung cancer screening. You may have this screening every year starting at age 16 if you have a 30-pack-year history of smoking and currently smoke or have quit within the past 15 years.  Fecal occult blood test (FOBT) of the stool. You may have this test every year starting at age 77.  Flexible sigmoidoscopy or colonoscopy. You may have a sigmoidoscopy every 5 years or a colonoscopy every 10 years starting at age 79.  Hepatitis C blood test.  Hepatitis B blood test.  Sexually transmitted disease (STD) testing.  Diabetes screening. This is done by checking your blood sugar (glucose) after you have not eaten for a while (fasting). You may have this done every 1-3 years.  Bone density scan. This is done to screen for osteoporosis. You may have this done starting at age 8.  Mammogram. This may be done every 1-2 years. Talk to your health care provider about how often you should have regular mammograms. Talk with your health care provider about your  test results, treatment options, and if necessary, the need for more tests. Vaccines  Your health care provider may recommend certain vaccines, such as:  Influenza vaccine. This is recommended every year.  Tetanus, diphtheria, and acellular pertussis (Tdap, Td) vaccine. You may need a Td booster every 10 years.  Zoster vaccine. You may need this after age  78.  Pneumococcal 13-valent conjugate (PCV13) vaccine. One dose is recommended after age 80.  Pneumococcal polysaccharide (PPSV23) vaccine. One dose is recommended after age 52. Talk to your health care provider about which screenings and vaccines you need and how often you need them. This information is not intended to replace advice given to you by your health care provider. Make sure you discuss any questions you have with your health care provider. Document Released: 05/12/2015 Document Revised: 01/03/2016 Document Reviewed: 02/14/2015 Elsevier Interactive Patient Education  2017 [place] Prevention in the Home Falls can cause injuries. They can happen to people of all ages. There are many things you can do to make your home safe and to help prevent falls. What can I do on the outside of my home?  Regularly fix the edges of walkways and driveways and fix any cracks.  Remove anything that might make you trip as you walk through a door, such as a raised step or threshold.  Trim any bushes or trees on the path to your home.  Use bright outdoor lighting.  Clear any walking paths of anything that might make someone trip, such as rocks or tools.  Regularly check to see if handrails are loose or broken. Make sure that both sides of any steps have handrails.  Any raised decks and porches should have guardrails on the edges.  Have any leaves, snow, or ice cleared regularly.  Use sand or salt on walking paths during winter.  Clean up any spills in your garage right away. This includes oil or grease spills. What can I do in the bathroom?  Use night lights.  Install grab bars by the toilet and in the tub and shower. Do not use towel bars as grab bars.  Use non-skid mats or decals in the tub or shower.  If you need to sit down in the shower, use a plastic, non-slip stool.  Keep the floor dry. Clean up any water that spills on the floor as soon as it happens.  Remove  soap buildup in the tub or shower regularly.  Attach bath mats securely with double-sided non-slip rug tape.  Do not have throw rugs and other things on the floor that can make you trip. What can I do in the bedroom?  Use night lights.  Make sure that you have a light by your bed that is easy to reach.  Do not use any sheets or blankets that are too big for your bed. They should not hang down onto the floor.  Have a firm chair that has side arms. You can use this for support while you get dressed.  Do not have throw rugs and other things on the floor that can make you trip. What can I do in the kitchen?  Clean up any spills right away.  Avoid walking on wet floors.  Keep items that you use a lot in easy-to-reach places.  If you need to reach something above you, use a strong step stool that has a grab bar.  Keep electrical cords out of the way.  Do not use floor polish or wax that  makes floors slippery. If you must use wax, use non-skid floor wax.  Do not have throw rugs and other things on the floor that can make you trip. What can I do with my stairs?  Do not leave any items on the stairs.  Make sure that there are handrails on both sides of the stairs and use them. Fix handrails that are broken or loose. Make sure that handrails are as long as the stairways.  Check any carpeting to make sure that it is firmly attached to the stairs. Fix any carpet that is loose or worn.  Avoid having throw rugs at the top or bottom of the stairs. If you do have throw rugs, attach them to the floor with carpet tape.  Make sure that you have a light switch at the top of the stairs and the bottom of the stairs. If you do not have them, ask someone to add them for you. What else can I do to help prevent falls?  Wear shoes that:  Do not have high heels.  Have rubber bottoms.  Are comfortable and fit you well.  Are closed at the toe. Do not wear sandals.  If you use a  stepladder:  Make sure that it is fully opened. Do not climb a closed stepladder.  Make sure that both sides of the stepladder are locked into place.  Ask someone to hold it for you, if possible.  Clearly mark and make sure that you can see:  Any grab bars or handrails.  First and last steps.  Where the edge of each step is.  Use tools that help you move around (mobility aids) if they are needed. These include:  Canes.  Walkers.  Scooters.  Crutches.  Turn on the lights when you go into a dark area. Replace any light bulbs as soon as they burn out.  Set up your furniture so you have a clear path. Avoid moving your furniture around.  If any of your floors are uneven, fix them.  If there are any pets around you, be aware of where they are.  Review your medicines with your doctor. Some medicines can make you feel dizzy. This can increase your chance of falling. Ask your doctor what other things that you can do to help prevent falls. This information is not intended to replace advice given to you by your health care provider. Make sure you discuss any questions you have with your health care provider. Document Released: 02/09/2009 Document Revised: 09/21/2015 Document Reviewed: 05/20/2014 Elsevier Interactive Patient Education  2017 [employer].

## 2017-04-11 NOTE — Progress Notes (Signed)
Subjective:   Sylvia [name] is a 71 y.o. female who presents for Medicare Annual (Subsequent) preventive examination.  Last AWV-04/11/2016    Objective:     Vitals: There were no vitals taken for this visit.  There is no height or weight on file to calculate BMI.  Advanced Directives 10/14/2016 10/14/2016 04/15/2016 04/11/2016 02/12/2016 10/12/2015 09/04/2015  Does Patient Have a Medical Advance Directive? Yes No Yes Yes Yes No No  Type of Advance Directive Living will - - [place]  Does patient want to make changes to medical advance directive? No - Patient declined No - Patient declined - - - - -  Copy of [place] in Chart? - - No - copy requested No - copy requested - - -  Would patient like information on creating a medical advance directive? No - Patient declined - - - - - No - patient declined information    Tobacco Social History   Tobacco Use  Smoking Status Former Smoker  . Packs/day: 0.25  . Last attempt to quit: 07/22/1980  . Years since quitting: 36.7  Smokeless Tobacco Never Used     Counseling given: Not Answered   Clinical Intake:                       Past Medical History:  Diagnosis Date  . Aphasia   . Arthritis    OA- fingers, toes, back pain, hip pain   . Breast cancer of upper-outer quadrant of left female breast ([place])    left breast cancer  . Complication of anesthesia    slow to wake up  . Disorder of bone and cartilage, unspecified   . Hypothyroid   . Lumbago   . Malignant neoplasm of breast (female), unspecified site   . Normal nuclear stress test    [facility], 07/18/2011  . Osteoarthritis (arthritis due to wear and tear of joints)   . Pain in joint, pelvic region and thigh   . Reflux esophagitis    pt. knowledgeable about dietary intake relative to reflux  . S/P bilateral breast biopsy 2013  . Shortness of breath    /w housework  . Symptomatic menopausal or female  climacteric states    Past Surgical History:  Procedure Laterality Date  . BREAST BIOPSY  07/26/2011   Procedure: BREAST BIOPSY WITH NEEDLE LOCALIZATION;  Surgeon: Rolm Bookbinder, MD;  Location: [facility];  Service: General;  Laterality: Right;  Right breast wire guided biospy Needle localization @ [facility]  7:30  . BREAST SURGERY     Procedure: BREAST BIOPSY WITH NEEDLE LOCALIZATION;  [facility]  . CATARACT EXTRACTION  2012  . DILATION AND CURETTAGE OF UTERUS  1978  . ECTOPIC PREGNANCY SURGERY     1980  . EYE SURGERY     R cat. extracted /w IOL  . TONSILLECTOMY     as a child   Family History  Problem Relation Age of Onset  . Cancer Sister        colon  . Cancer Brother        renal  . Heart disease Brother   . Cancer Mother        leukemia  . Cancer Father        lung  . Heart attack Brother   . Cancer Maternal Aunt        breast  . Cancer Cousin   . Cancer Cousin   .  Anesthesia problems Neg Hx    Social History   Socioeconomic History  . Marital status: Married    Spouse name: Not on file  . Number of children: Not on file  . Years of education: Not on file  . Highest education level: Not on file  Social Needs  . Financial resource strain: Not on file  . Food insecurity - worry: Not on file  . Food insecurity - inability: Not on file  . Transportation needs - medical: Not on file  . Transportation needs - non-medical: Not on file  Occupational History  . Not on file  Tobacco Use  . Smoking status: Former Smoker    Packs/day: 0.25    Last attempt to quit: 07/22/1980    Years since quitting: 36.7  . Smokeless tobacco: Never Used  Substance and Sexual Activity  . Alcohol use: No  . Drug use: No  . Sexual activity: Not Currently    Birth control/protection: None  Other Topics Concern  . Not on file  Social History Narrative  . Not on file    Outpatient Encounter Medications as of 04/11/2017  Medication Sig  . Calcium Carbonate-Vitamin D (CALCIUM + D PO) Take  600 mg by mouth 2 (two) times daily.  . cholecalciferol (VITAMIN D) 1000 UNITS tablet Take 1,000 Units by mouth 2 (two) times daily.  Marland Kitchen gabapentin (NEURONTIN) 100 MG capsule TAKE ONE TO TWO CAPSULES BY MOUTH AT BEDTIME  . ibuprofen (ADVIL,MOTRIN) 100 MG tablet Take 400 mg by mouth every 6 (six) hours as needed. For pain  . SYNTHROID 88 MCG tablet TAKE 1 TABLET (88 MCG TOTAL) BY MOUTH DAILY BEFORE BREAKFAST.  Marland Kitchen vitamin E 400 UNIT capsule Take 400 Units by mouth 2 (two) times daily.  . VOLTAREN 1 % GEL Ad lib.   No facility-administered encounter medications on file as of 04/11/2017.     Activities of Daily Living In your present state of health, do you have any difficulty performing the following activities: 04/11/2016  Hearing? Y  Vision? Y  Comment Recent cataract surgery  Difficulty concentrating or making decisions? Y  Comment Difficulty remembering at times  Walking or climbing stairs? N  Doing errands, shopping? N  Comment Pt still drives vehicle.   Preparing Food and eating ? N  Using the Toilet? N  In the past six months, have you accidently leaked urine? N  Do you have problems with loss of bowel control? N  Managing your Medications? N  Managing your Finances? N  Housekeeping or managing your Housekeeping? N  Some recent data might be hidden    Patient Care Team: Gayland Curry, DO as PCP - General (Geriatric Medicine) Starlyn Skeans as Physician Assistant (Dermatology) Magrinat, Virgie Dad, MD as Consulting Physician (Oncology)    Assessment:   This is a routine wellness examination for [facility].  Exercise Activities and Dietary recommendations    Goals    . Weight (lb) < 145 lb (65.8 kg)     Starting 04/11/16, I will attempt to decrease my weight to get to 145-147lbs.        Fall Risk Fall Risk  10/14/2016 04/15/2016 04/11/2016 10/12/2015 04/13/2015  Falls in the past year? No No No No No   Is the patient's home free of loose throw rugs in  walkways, pet beds, electrical cords, etc?   yes      Grab bars in the bathroom? yes      Handrails on the stairs?  yes      Adequate lighting?   yes  Timed Get Up and Go performed: 16 seconds, fall risk  Depression Screen PHQ 2/9 Scores 10/14/2016 04/15/2016 04/11/2016 10/12/2015  PHQ - 2 Score 0 0 3 0  PHQ- 9 Score - - 6 -     Cognitive Function MMSE - Mini Mental State Exam 04/11/2016 04/13/2015 04/08/2014  Orientation to time 5 5 5   Orientation to Place 5 5 5   Registration 3 3 3   Attention/ Calculation 5 5 5   Recall 3 3 2   Language- name 2 objects 2 2 2   Language- repeat 1 1 1   Language- follow 3 step command 3 3 3   Language- read & follow direction 1 1 1   Write a sentence 1 1 1   Copy design 1 1 1   Total score 30 30 29         Immunization History  Administered Date(s) Administered  . Influenza Inj Mdck Quad Pf 02/10/2017  . Influenza Whole 03/18/2014  . Influenza, Seasonal, Injecte, Preservative Fre 02/01/2016  . Influenza-Unspecified 02/05/2010, 03/30/2012, 04/02/2013, 02/21/2015  . Pneumococcal Conjugate-13 04/08/2014  . Pneumococcal Polysaccharide-23 09/28/2012  . Td 04/29/1978  . Tdap 04/15/2013  . Zoster 01/24/2014    Qualifies for Shingles Vaccine? Yes, prescription sent to pharmacy and education given  Screening Tests Health Maintenance  Topic Date Due  . COLONOSCOPY  03/23/2017  . MAMMOGRAM  02/11/2019  . TETANUS/TDAP  04/16/2023  . INFLUENZA VACCINE  Completed  . DEXA SCAN  Completed  . Hepatitis C Screening  Completed  . PNA vac Low Risk Adult  Completed    Cancer Screenings: Lung: Low Dose CT Chest recommended if Age 30-80 years, 30 pack-year currently smoking OR have quit w/in 15years. Patient does not qualify. Breast:  Up to date on Mammogram? Yes   Up to date of Bone Density/Dexa? Yes Colorectal: due, patient is going to reschedule  Additional Screenings:  Hepatitis B/HIV/Syphillis:Not indicated Hepatitis C Screening: Not  indicated     Plan:    I have personally reviewed and addressed the Medicare Annual Wellness questionnaire and have noted the following in the patient's chart:  A. Medical and social history B. Use of alcohol, tobacco or illicit drugs  C. Current medications and supplements D. Functional ability and status E.  Nutritional status F.  Physical activity G. Advance directives H. List of other physicians I.  Hospitalizations, surgeries, and ER visits in previous 12 months J.  [employer] to include hearing, vision, cognitive, depression L. Referrals and appointments - none  In addition, I have reviewed and discussed with patient certain preventive protocols, quality metrics, and best practice recommendations. A written personalized care plan for preventive services as well as general preventive health recommendations were provided to patient.  See attached scanned questionnaire for additional information.   Signed,   Rich Reining, RN Nurse Health Advisor   Quick Notes   Health Maintenance: Shingrix prescription resent to pharmacy. Pt will reschedule colonoscopy      Abnormal Screen: MMSE 29/30. Passed clock drawing     Patient Concerns: Pt would like cholesterol levels to be checked.     Nurse Concerns: None

## 2017-04-14 ENCOUNTER — Ambulatory Visit (INDEPENDENT_AMBULATORY_CARE_PROVIDER_SITE_OTHER): Payer: Medicare Other | Admitting: Internal Medicine

## 2017-04-14 ENCOUNTER — Encounter: Payer: Self-pay | Admitting: Internal Medicine

## 2017-04-14 VITALS — BP 110/60 | HR 72 | Temp 98.3°F | Ht 62.0 in | Wt 156.0 lb

## 2017-04-14 DIAGNOSIS — M8589 Other specified disorders of bone density and structure, multiple sites: Secondary | ICD-10-CM

## 2017-04-14 DIAGNOSIS — E663 Overweight: Secondary | ICD-10-CM | POA: Diagnosis not present

## 2017-04-14 DIAGNOSIS — E039 Hypothyroidism, unspecified: Secondary | ICD-10-CM

## 2017-04-14 DIAGNOSIS — M150 Primary generalized (osteo)arthritis: Secondary | ICD-10-CM | POA: Diagnosis not present

## 2017-04-14 DIAGNOSIS — M238X1 Other internal derangements of right knee: Secondary | ICD-10-CM

## 2017-04-14 DIAGNOSIS — N951 Menopausal and female climacteric states: Secondary | ICD-10-CM

## 2017-04-14 DIAGNOSIS — Z13220 Encounter for screening for lipoid disorders: Secondary | ICD-10-CM

## 2017-04-14 DIAGNOSIS — M159 Polyosteoarthritis, unspecified: Secondary | ICD-10-CM

## 2017-04-14 DIAGNOSIS — M8949 Other hypertrophic osteoarthropathy, multiple sites: Secondary | ICD-10-CM

## 2017-04-14 NOTE — Progress Notes (Signed)
Location:  [facility] clinic Provider:  Idonia Zollinger L. Mariea Clonts, D.O., C.M.D.  Code Status: DNR Goals of Care:  Advanced Directives 04/11/2017  Does Patient Have a Medical Advance Directive? Yes  Type of Paramedic of [place];Living will  Does patient want to make changes to medical advance directive? No - Patient declined  Copy of [place] in Chart? No - copy requested  Would patient like information on creating a medical advance directive? -   Chief Complaint  Patient presents with  . Medical Management of Chronic Issues    96mth follow-up    HPI: Patient is a 71 y.o. female seen today for medical management of chronic diseases.   No new things.  Still hanging in there.  She had more trouble with her knee so she could not walk for about a month for exercise.  It seems to have flare ups on the medial aspect and sometimes the front.  The weight is back up.  Knows part is bad weather and not getting out.  Can't do heat.  She's up 6 lbs.  Has taken an aleve and used voltaren.  Knee felt like it would give out.  Feels like it will go backwards.  Has not been out walking much since the bad weather.   Hand arthritis is getting worse.  Trying to watch what she eats.   Taking one gabapentin at hs.  Doesn't know if she sleeps through the hot flashes or they don't start until she opens her eyes in the morning.    Pharmacy called her about shingrix and she's on waiting list now.    Needs to reschedule colonoscopy.  Her husband has too many appts with his prostate cancer.  She'll schedule it in January.    Not getting sensation to urinate like she did, but will empty normally when she does take herself to the bathroom.  No other symptoms.  Past Medical History:  Diagnosis Date  . Aphasia   . Arthritis    OA- fingers, toes, back pain, hip pain   . Breast cancer of upper-outer quadrant of left female breast ([place])    left breast cancer  . Complication of  anesthesia    slow to wake up  . Disorder of bone and cartilage, unspecified   . Hypothyroid   . Lumbago   . Malignant neoplasm of breast (female), unspecified site   . Normal nuclear stress test    [facility], 07/18/2011  . Osteoarthritis (arthritis due to wear and tear of joints)   . Pain in joint, pelvic region and thigh   . Reflux esophagitis    pt. knowledgeable about dietary intake relative to reflux  . S/P bilateral breast biopsy 2013  . Shortness of breath    /w housework  . Symptomatic menopausal or female climacteric states     Past Surgical History:  Procedure Laterality Date  . BREAST BIOPSY  07/26/2011   Procedure: BREAST BIOPSY WITH NEEDLE LOCALIZATION;  Surgeon: Rolm Bookbinder, MD;  Location: [place];  Service: General;  Laterality: Right;  Right breast wire guided biospy Needle localization @ [facility]  7:30  . BREAST SURGERY     Procedure: BREAST BIOPSY WITH NEEDLE LOCALIZATION;  [place]  . CATARACT EXTRACTION  2012  . DILATION AND CURETTAGE OF UTERUS  1978  . ECTOPIC PREGNANCY SURGERY     1980  . EYE SURGERY     R cat. extracted /w IOL  . TONSILLECTOMY  as a child    Allergies  Allergen Reactions  . Benadryl [Diphenhydramine Hcl] Other (See Comments)    "Super hyper"  . Tape     Skin breakdown/redness   . Boniva [Ibandronate Sodium] Other (See Comments)    Neck, shoulder limited mobility   . Chlorhexidine Gluconate   . Peridin-C [Ascorbic Acid]   . Venlafaxine   . Codeine Other (See Comments)    "can't wake up from it."  . Sulfa Antibiotics Rash    Patient unsure of reaction    Outpatient Encounter Medications as of 04/14/2017  Medication Sig  . Calcium Carbonate-Vitamin D (CALCIUM + D PO) Take 400 mg by mouth daily.   . cholecalciferol (VITAMIN D) 1000 UNITS tablet Take 1,000 Units by mouth 2 (two) times daily.  . COD LIVER OIL/VITAMINS A & D PO Take 415 mg by mouth daily.  Marland Kitchen gabapentin (NEURONTIN) 100 MG capsule TAKE ONE TO TWO CAPSULES BY MOUTH  AT BEDTIME  . ibuprofen (ADVIL,MOTRIN) 100 MG tablet Take 400 mg by mouth every 6 (six) hours as needed. For pain  . SYNTHROID 88 MCG tablet TAKE 1 TABLET (88 MCG TOTAL) BY MOUTH DAILY BEFORE BREAKFAST.  Marland Kitchen vitamin E 400 UNIT capsule Take 400 Units by mouth 2 (two) times daily.  . VOLTAREN 1 % GEL Ad lib.   No facility-administered encounter medications on file as of 04/14/2017.     Review of Systems:  Review of Systems  Constitutional: Negative for chills, fever and malaise/fatigue.  HENT: Negative for congestion and hearing loss.   Eyes: Negative for blurred vision.       Glasses  Respiratory: Negative for cough and shortness of breath.   Cardiovascular: Negative for chest pain, palpitations and leg swelling.  Gastrointestinal: Positive for constipation. Negative for abdominal pain, blood in stool, diarrhea and melena.       Constipation improved with decreased calcium dose  Genitourinary: Negative for dysuria.       No longer getting urge to urinate  Musculoskeletal: Positive for joint pain. Negative for falls.       No tenderness of right knee or laxity at present  Neurological: Negative for dizziness, loss of consciousness and weakness.  Endo/Heme/Allergies:       Hot flashes  Psychiatric/Behavioral: Negative for depression and memory loss. The patient is not nervous/anxious and does not have insomnia.     Health Maintenance  Topic Date Due  . COLONOSCOPY  03/23/2017  . MAMMOGRAM  02/11/2019  . TETANUS/TDAP  04/16/2023  . INFLUENZA VACCINE  Completed  . DEXA SCAN  Completed  . Hepatitis C Screening  Completed  . PNA vac Low Risk Adult  Completed    Physical Exam: Vitals:   04/14/17 1033  BP: 110/60  Pulse: 72  Temp: 98.3 F (36.8 C)  TempSrc: Oral  SpO2: 95%  Weight: 156 lb (70.8 kg)  Height: 5\' 2"  (1.575 m)   Body mass index is 28.53 kg/m. Physical Exam  Constitutional: She is oriented to person, place, and time. She appears well-developed and  well-nourished. No distress.  HENT:  Head: Normocephalic and atraumatic.  Eyes:  glasses  Cardiovascular: Normal rate, regular rhythm, normal heart sounds and intact distal pulses.  Pulmonary/Chest: Effort normal and breath sounds normal. No respiratory distress.  Abdominal: Bowel sounds are normal.  Musculoskeletal: Normal range of motion. She exhibits no tenderness.  Neurological: She is alert and oriented to person, place, and time. No cranial nerve deficit.  Skin: Skin is warm and  dry. Capillary refill takes less than 2 seconds.  Psychiatric: She has a normal mood and affect.   Labs reviewed: Basic Metabolic Panel: Recent Labs    08/12/16 1335 10/14/16 1037 04/11/17 1124  NA 141  --   --   K 4.4  --   --   CO2 25  --   --   GLUCOSE 88  --   --   BUN 12.3  --   --   CREATININE 0.8  --   --   CALCIUM 8.8  --   --   TSH  --  0.22* 0.73   Liver Function Tests: Recent Labs    08/12/16 1335  AST 22  ALT 21  ALKPHOS 42  BILITOT 0.43  PROT 6.9  ALBUMIN 3.9   No results for input(s): LIPASE, AMYLASE in the last 8760 hours. No results for input(s): AMMONIA in the last 8760 hours. CBC: Recent Labs    08/12/16 1335  WBC 4.2  NEUTROABS 2.3  HGB 13.7  HCT 39.8  MCV 92.5  PLT 195   Lipid Panel: Recent Labs    10/14/16 1037  CHOL 170  HDL 57  LDLCALC 86  TRIG 133  CHOLHDL 3.0   Assessment/Plan 1. Acquired hypothyroidism -cont levothyroxine therapy, clinically euthyroid - TSH; Future - T4, free; Future  2. Overweight (BMI 25.0-29.9) - wt back up after not being able to walk with knee and then weather--also limited b/c her husband wants to go with her and he cannot go with his breathing at high or low temps - COMPLETE METABOLIC PANEL WITH GFR; Future - Lipid panel; Future  3. Osteopenia of multiple sites -cont calcium and D3, walking and try inside exercise like light weights - COMPLETE METABOLIC PANEL WITH GFR; Future  4. Symptomatic menopausal or  female climacteric states - continues on hs gabapentin therapy--one tablet  - CBC with Differential/Platelet; Future  5. Joint laxity of right knee -better lately, if resumes when she starts her exercise program, to call back for MRI to evaluate menisci and ligaments  6. Primary osteoarthritis involving multiple joints -worsening in hands especially, cont voltaren gel, tylenol not helpful, occasional ibuprofen, but not routinely and with food  7. Screening, lipid -lipids at goal as of this month, f/u in 6 mos after resuming exercise - Lipid panel; Future  Labs/tests ordered:   Orders Placed This Encounter  Procedures  . CBC with Differential/Platelet    Standing Status:   Future    Standing Expiration Date:   04/14/2018  . COMPLETE METABOLIC PANEL WITH GFR    Standing Status:   Future    Standing Expiration Date:   04/14/2018  . Lipid panel    Standing Status:   Future    Standing Expiration Date:   04/14/2018  . TSH    Standing Status:   Future    Standing Expiration Date:   04/14/2018  . T4, free    Standing Status:   Future    Standing Expiration Date:   04/14/2018    Next appt: 6 mos med mgt, labs before   Moishe Schellenberg L. Daoud Lobue, D.O. [facility] [address], [place] [address] Cell Phone (Mon-Fri 8am-5pm):  [phone] On Call:  [phone] & follow prompts after 5pm & weekends Office Phone:  [phone] Office Fax:  [phone]

## 2017-05-12 ENCOUNTER — Other Ambulatory Visit: Payer: Self-pay | Admitting: Internal Medicine

## 2017-07-02 NOTE — Progress Notes (Signed)
This encounter was created in error - please disregard.

## 2017-09-04 ENCOUNTER — Other Ambulatory Visit: Payer: Self-pay | Admitting: Internal Medicine

## 2017-09-12 ENCOUNTER — Encounter: Payer: Self-pay | Admitting: Internal Medicine

## 2017-09-12 MED ORDER — GABAPENTIN 100 MG PO CAPS: ORAL_CAPSULE | ORAL | 5 refills | Status: DC

## 2017-10-09 ENCOUNTER — Other Ambulatory Visit: Payer: Medicare Other

## 2017-10-09 DIAGNOSIS — E039 Hypothyroidism, unspecified: Secondary | ICD-10-CM | POA: Diagnosis not present

## 2017-10-09 DIAGNOSIS — E663 Overweight: Secondary | ICD-10-CM

## 2017-10-09 DIAGNOSIS — N951 Menopausal and female climacteric states: Secondary | ICD-10-CM | POA: Diagnosis not present

## 2017-10-09 DIAGNOSIS — M8589 Other specified disorders of bone density and structure, multiple sites: Secondary | ICD-10-CM

## 2017-10-09 DIAGNOSIS — Z13220 Encounter for screening for lipoid disorders: Secondary | ICD-10-CM | POA: Diagnosis not present

## 2017-10-09 DIAGNOSIS — G629 Polyneuropathy, unspecified: Secondary | ICD-10-CM | POA: Diagnosis not present

## 2017-10-13 ENCOUNTER — Encounter: Payer: Self-pay | Admitting: Internal Medicine

## 2017-10-13 ENCOUNTER — Ambulatory Visit
Admission: RE | Admit: 2017-10-13 | Discharge: 2017-10-13 | Disposition: A | Discharge status: Home / Self Care | Payer: Medicare Other | Source: Ambulatory Visit | Attending: Internal Medicine | Admitting: Internal Medicine

## 2017-10-13 ENCOUNTER — Ambulatory Visit (INDEPENDENT_AMBULATORY_CARE_PROVIDER_SITE_OTHER): Payer: Medicare Other | Admitting: Internal Medicine

## 2017-10-13 VITALS — BP 110/68 | HR 68 | Temp 98.1°F | Ht 62.0 in | Wt 156.0 lb

## 2017-10-13 DIAGNOSIS — M150 Primary generalized (osteo)arthritis: Secondary | ICD-10-CM | POA: Diagnosis not present

## 2017-10-13 DIAGNOSIS — M79671 Pain in right foot: Secondary | ICD-10-CM

## 2017-10-13 DIAGNOSIS — E7800 Pure hypercholesterolemia, unspecified: Secondary | ICD-10-CM

## 2017-10-13 DIAGNOSIS — E039 Hypothyroidism, unspecified: Secondary | ICD-10-CM | POA: Diagnosis not present

## 2017-10-13 DIAGNOSIS — E663 Overweight: Secondary | ICD-10-CM

## 2017-10-13 DIAGNOSIS — M79672 Pain in left foot: Secondary | ICD-10-CM | POA: Diagnosis not present

## 2017-10-13 DIAGNOSIS — M8949 Other hypertrophic osteoarthropathy, multiple sites: Secondary | ICD-10-CM

## 2017-10-13 DIAGNOSIS — G629 Polyneuropathy, unspecified: Secondary | ICD-10-CM

## 2017-10-13 DIAGNOSIS — M19071 Primary osteoarthritis, right ankle and foot: Secondary | ICD-10-CM | POA: Diagnosis not present

## 2017-10-13 DIAGNOSIS — M159 Polyosteoarthritis, unspecified: Secondary | ICD-10-CM

## 2017-10-13 DIAGNOSIS — M19072 Primary osteoarthritis, left ankle and foot: Secondary | ICD-10-CM | POA: Diagnosis not present

## 2017-10-13 LAB — COMPLETE METABOLIC PANEL WITH GFR
AG Ratio: 1.7 (calc) (ref 1.0–2.5)
ALT: 7 U/L (ref 6–29)
AST: 12 U/L (ref 10–35)
Albumin: 4.2 g/dL (ref 3.6–5.1)
Alkaline phosphatase (APISO): 68 U/L (ref 33–130)
BUN: 12 mg/dL (ref 7–25)
CO2: 29 mmol/L (ref 20–32)
Calcium: 8.9 mg/dL (ref 8.6–10.4)
Chloride: 107 mmol/L (ref 98–110)
Creat: 0.76 mg/dL (ref 0.60–0.93)
GFR, Est African American: 90 mL/min/{1.73_m2} (ref >=60)
GFR, Est Non African American: 78 mL/min/{1.73_m2} (ref >=60)
Globulin: 2.5 g/dL (calc) (ref 1.9–3.7)
Glucose, Bld: 88 mg/dL (ref 65–99)
Potassium: 4.4 mmol/L (ref 3.5–5.3)
Sodium: 141 mmol/L (ref 135–146)
Total Bilirubin: 0.5 mg/dL (ref 0.2–1.2)
Total Protein: 6.7 g/dL (ref 6.1–8.1)

## 2017-10-13 LAB — CBC WITH DIFFERENTIAL/PLATELET
Basophils Absolute: 41 cells/uL (ref 0–200)
Basophils Relative: 1.1 %
Eosinophils Absolute: 93 cells/uL (ref 15–500)
Eosinophils Relative: 2.5 %
HCT: 39.3 % (ref 35.0–45.0)
Hemoglobin: 13.6 g/dL (ref 11.7–15.5)
Lymphs Abs: 1606 cells/uL (ref 850–3900)
MCH: 31.1 pg (ref 27.0–33.0)
MCHC: 34.6 g/dL (ref 32.0–36.0)
MCV: 89.9 fL (ref 80.0–100.0)
MPV: 9.4 fL (ref 7.5–12.5)
Monocytes Relative: 7.7 %
Neutro Abs: 1676 cells/uL (ref 1500–7800)
Neutrophils Relative %: 45.3 %
Platelets: 260 10*3/uL (ref 140–400)
RBC: 4.37 10*6/uL (ref 3.80–5.10)
RDW: 12.7 % (ref 11.0–15.0)
Total Lymphocyte: 43.4 %
WBC mixed population: 285 cells/uL (ref 200–950)
WBC: 3.7 10*3/uL — ABNORMAL LOW (ref 3.8–10.8)

## 2017-10-13 LAB — TEST AUTHORIZATION

## 2017-10-13 LAB — LIPID PANEL
Cholesterol: 193 mg/dL (ref <200)
HDL: 52 mg/dL (ref >50)
LDL Cholesterol (Calc): 119 mg/dL (calc) — ABNORMAL HIGH
Non-HDL Cholesterol (Calc): 141 mg/dL (calc) — ABNORMAL HIGH (ref <130)
Total CHOL/HDL Ratio: 3.7 (calc) (ref <5.0)
Triglycerides: 112 mg/dL (ref <150)

## 2017-10-13 LAB — T4, FREE: Free T4: 1.4 ng/dL (ref 0.8–1.8)

## 2017-10-13 LAB — TSH: TSH: 0.86 mIU/L (ref 0.40–4.50)

## 2017-10-13 LAB — VITAMIN B12: Vitamin B-12: 651 pg/mL (ref 200–1100)

## 2017-10-13 MED ORDER — DICLOFENAC SODIUM 1 % TD GEL: 2.0000 g | Freq: Every day | TRANSDERMAL | 3 refills | Status: DC

## 2017-10-13 MED ORDER — GABAPENTIN 100 MG PO CAPS: ORAL_CAPSULE | ORAL | 5 refills | Status: DC

## 2017-10-13 NOTE — Progress Notes (Signed)
Location:  [facility] clinic Provider:  Kimsey Demaree L. Mariea Clonts, D.O., C.M.D.  Code Status: full code Goals of Care:  Advanced Directives 10/13/2017  Does Patient Have a Medical Advance Directive? Yes  Type of Paramedic of [place];Living will  Does patient want to make changes to medical advance directive? No - Patient declined  Copy of [place] in Chart? Yes  Would patient like information on creating a medical advance directive? -     Chief Complaint  Patient presents with  . Medical Management of Chronic Issues    65mth follow-up    HPI: Patient is a 72 y.o. female seen today for medical management of chronic diseases.    LDL has trended up to 119--discussed goal of less than 100.  She is walking and does a lot of steps through the day.  She has very bad heel pain bilaterally and burning in her feet like they're on fire.  It does not matter which shoes she wears and different socks.  It's worse when she's in bed at night.  She's been putting voltaren gel on them and wearing socks at night so she can sleep.   Has had the burning for about a month.  Thinks heel pain came later when she got out of bed in the morning.  Shooting pains.  After she's up and around, she can go and walk, but she can tell.  Two gabapentin at bedtime make her dizzy, so just taking one.  In the past couple of weeks, a growth came up behind her right ear.  If she rubs it or hits it with her washrag it hurts, but if she lays on it it does not bother her.  It just popped up like it is.    A week before last, she got a very sharp pain in her stomach (a week ago thurs was coming on, then got very bad by Saturday--used heating pad).  Happened a year ago march also.  Has not had her colonoscopy.  Wonders if is having diverticulitis.  Knows she has diverticulosis.  Had chills and could not warm up.  Had three blankets, socks and a heating pad to warm up.  She did not check her temp then.   Also overdue for cscope.    Past Medical History:  Diagnosis Date  . Aphasia   . Arthritis    OA- fingers, toes, back pain, hip pain   . Breast cancer of upper-outer quadrant of left female breast ([place])    left breast cancer  . Complication of anesthesia    slow to wake up  . Disorder of bone and cartilage, unspecified   . Hypothyroid   . Lumbago   . Malignant neoplasm of breast (female), unspecified site   . Normal nuclear stress test    [facility], 07/18/2011  . Osteoarthritis (arthritis due to wear and tear of joints)   . Pain in joint, pelvic region and thigh   . Reflux esophagitis    pt. knowledgeable about dietary intake relative to reflux  . S/P bilateral breast biopsy 2013  . Shortness of breath    /w housework  . Symptomatic menopausal or female climacteric states     Past Surgical History:  Procedure Laterality Date  . BREAST BIOPSY  07/26/2011   Procedure: BREAST BIOPSY WITH NEEDLE LOCALIZATION;  Surgeon: Rolm Bookbinder, MD;  Location: [facility];  Service: General;  Laterality: Right;  Right breast wire guided biospy Needle localization @  [facility]  7:30  . BREAST SURGERY     Procedure: BREAST BIOPSY WITH NEEDLE LOCALIZATION;  [place]  . CATARACT EXTRACTION  2012  . DILATION AND CURETTAGE OF UTERUS  1978  . ECTOPIC PREGNANCY SURGERY     1980  . EYE SURGERY     R cat. extracted /w IOL  . TONSILLECTOMY     as a child    Allergies  Allergen Reactions  . Benadryl [Diphenhydramine Hcl] Other (See Comments)    "Super hyper"  . Tape     Skin breakdown/redness   . Boniva [Ibandronate Sodium] Other (See Comments)    Neck, shoulder limited mobility   . Chlorhexidine Gluconate   . Peridin-C [Ascorbic Acid]   . Venlafaxine   . Codeine Other (See Comments)    "can't wake up from it."  . Sulfa Antibiotics Rash    Patient unsure of reaction    Outpatient Encounter Medications as of 10/13/2017  Medication Sig  . Calcium Carbonate-Vit D-Min (CALTRATE 600+D PLUS) 600-400  MG-UNIT per tablet Take 2 tablets by mouth daily.  . cholecalciferol (VITAMIN D) 1000 UNITS tablet Take 1,000 Units by mouth 2 (two) times daily.  . COD LIVER OIL/VITAMINS A & D PO Take 415 mg by mouth daily.  . diclofenac sodium (VOLTAREN) 1 % GEL Apply 2 g topically daily.  Marland Kitchen gabapentin (NEURONTIN) 100 MG capsule TAKE ONE CAPSULE BY MOUTH AT BEDTIME  . ibuprofen (ADVIL,MOTRIN) 100 MG tablet Take 400 mg by mouth every 6 (six) hours as needed. For pain  . SYNTHROID 88 MCG tablet TAKE 1 TABLET (88 MCG TOTAL) BY MOUTH DAILY BEFORE BREAKFAST.  Marland Kitchen vitamin E 400 UNIT capsule Take 400 Units by mouth 2 (two) times daily.  . [DISCONTINUED] Calcium Carbonate-Vitamin D (CALCIUM + D PO) Take 400 mg by mouth daily.   . [DISCONTINUED] gabapentin (NEURONTIN) 100 MG capsule TAKE ONE TO TWO CAPSULES BY MOUTH AT BEDTIME  . [DISCONTINUED] VOLTAREN 1 % GEL Apply 2 g topically daily.   . [DISCONTINUED] Calcium Carbonate-Vit D-Min (CALTRATE 600+D PLUS) 600-400 MG-UNIT per tablet Take 2 tablets by mouth daily.   No facility-administered encounter medications on file as of 10/13/2017.     Review of Systems:  Review of Systems  Constitutional: Negative for chills, diaphoresis, fever, malaise/fatigue and weight loss.  HENT: Negative for congestion.   Eyes: Negative for blurred vision.  Respiratory: Negative for shortness of breath.   Cardiovascular: Negative for chest pain, palpitations and leg swelling.  Gastrointestinal: Negative for abdominal pain, blood in stool, constipation, diarrhea and melena.  Genitourinary: Negative for dysuria.  Musculoskeletal: Negative for back pain, falls and joint pain.       Heels painful  Skin: Negative for itching and rash.  Neurological: Positive for tingling. Negative for sensory change.       Burning on soles of feet  Endo/Heme/Allergies: Does not bruise/bleed easily.  Psychiatric/Behavioral: Negative for depression and memory loss. The patient is not nervous/anxious and  does not have insomnia.        Hot flashes improved with gabapentin    Health Maintenance  Topic Date Due  . COLONOSCOPY  03/23/2017  . INFLUENZA VACCINE  11/27/2017  . MAMMOGRAM  02/11/2019  . TETANUS/TDAP  04/16/2023  . DEXA SCAN  Completed  . Hepatitis C Screening  Completed  . PNA vac Low Risk Adult  Completed    Physical Exam: Vitals:   10/13/17 1048  BP: 110/68  Pulse: 68  Temp: 98.1 F (36.7 C)  TempSrc: Oral  SpO2: 97%  Weight: 156 lb (70.8 kg)  Height: 5\' 2"  (1.575 m)   Body mass index is 28.53 kg/m. Physical Exam  Constitutional: She is oriented to person, place, and time. She appears well-developed and well-nourished. No distress.  HENT:  Head: Normocephalic and atraumatic.  Cardiovascular: Normal rate, regular rhythm, normal heart sounds and intact distal pulses.  Pulmonary/Chest: Effort normal and breath sounds normal. No respiratory distress.  Abdominal: Bowel sounds are normal.  Musculoskeletal: Normal range of motion. She exhibits no tenderness.  Neurological: She is alert and oriented to person, place, and time. She displays normal reflexes. No cranial nerve deficit or sensory deficit. She exhibits normal muscle tone. Coordination normal.  Sensation intact to monofilament  Skin: Skin is warm and dry. Capillary refill takes less than 2 seconds.  Psychiatric: She has a normal mood and affect.    Labs reviewed: Basic Metabolic Panel: Recent Labs    10/14/16 1037 04/11/17 1124 10/09/17 0903  NA  --   --  141  K  --   --  4.4  CL  --   --  107  CO2  --   --  29  GLUCOSE  --   --  88  BUN  --   --  12  CREATININE  --   --  0.76  CALCIUM  --   --  8.9  TSH 0.22* 0.73 0.86   Liver Function Tests: Recent Labs    10/09/17 0903  AST 12  ALT 7  BILITOT 0.5  PROT 6.7   No results for input(s): LIPASE, AMYLASE in the last 8760 hours. No results for input(s): AMMONIA in the last 8760 hours. CBC: Recent Labs    10/09/17 0903  WBC 3.7*    NEUTROABS 1,676  HGB 13.6  HCT 39.3  MCV 89.9  PLT 260   Lipid Panel: Recent Labs    10/14/16 1037 10/09/17 0903  CHOL 170 193  HDL 57 52  LDLCALC 86 119*  TRIG 133 112  CHOLHDL 3.0 3.7   Assessment/Plan 1. Primary osteoarthritis involving multiple joints - may also be in heels, known in knees, hands - diclofenac sodium (VOLTAREN) 1 % GEL; Apply 2 g topically daily.  Dispense: 100 g; Refill: 3  2. Neuropathy -new onset, check B12 level, if normal, will send to neurology for NCS -cont hs gabapentin  3. Acquired hypothyroidism -cont current levothyroxine--therapeutic  4. Heel pain, bilateral -?heel spurs or arthritis or plantar fasciitis--stretches recommended - DG Foot Complete Left; Future - DG Foot Complete Right; Future  5. Overweight (BMI 25.0-29.9) -is walking and counseled on healthy diet  6. Pure hypercholesterolemia -LDL has trended up--will work on choices in diet  Labs/tests ordered:  Add B12, foot xrays Next appt:  6 mos med mgt  Josephine Rudnick L. Adaora Mchaney, D.O. [facility] [address], [place] [address] Cell Phone (Mon-Fri 8am-5pm):  [phone] On Call:  [phone] & follow prompts after 5pm & weekends Office Phone:  [phone] Office Fax:  [phone]

## 2017-10-16 ENCOUNTER — Encounter: Payer: Self-pay | Admitting: Internal Medicine

## 2017-10-16 ENCOUNTER — Other Ambulatory Visit: Payer: Self-pay | Admitting: Internal Medicine

## 2017-10-16 DIAGNOSIS — M79671 Pain in right foot: Secondary | ICD-10-CM

## 2017-10-16 DIAGNOSIS — M79672 Pain in left foot: Principal | ICD-10-CM

## 2017-10-16 DIAGNOSIS — G629 Polyneuropathy, unspecified: Secondary | ICD-10-CM

## 2017-10-27 ENCOUNTER — Encounter: Payer: Self-pay | Admitting: Podiatry

## 2017-10-27 ENCOUNTER — Ambulatory Visit (INDEPENDENT_AMBULATORY_CARE_PROVIDER_SITE_OTHER): Payer: Medicare Other | Admitting: Podiatry

## 2017-10-27 DIAGNOSIS — M722 Plantar fascial fibromatosis: Secondary | ICD-10-CM | POA: Diagnosis not present

## 2017-10-27 DIAGNOSIS — G629 Polyneuropathy, unspecified: Secondary | ICD-10-CM

## 2017-10-27 MED ORDER — TRIAMCINOLONE ACETONIDE 10 MG/ML IJ SUSP
10.0000 mg | Freq: Once | INTRAMUSCULAR | Status: AC
  Administered 2017-10-27: 10 mg

## 2017-10-27 NOTE — Progress Notes (Signed)
   Subjective:    Patient ID: Sylvia [name], female    DOB: May 06, 1945, 72 y.o.   MRN: [id number]  HPI    Review of Systems  All other systems reviewed and are negative.      Objective:   Physical Exam        Assessment & Plan:

## 2017-10-27 NOTE — Patient Instructions (Signed)

## 2017-11-10 DIAGNOSIS — R1084 Generalized abdominal pain: Secondary | ICD-10-CM | POA: Diagnosis not present

## 2017-11-10 DIAGNOSIS — Z800 Family history of malignant neoplasm of digestive organs: Secondary | ICD-10-CM | POA: Diagnosis not present

## 2017-11-14 ENCOUNTER — Other Ambulatory Visit: Payer: Self-pay | Admitting: Physician Assistant

## 2017-11-14 DIAGNOSIS — D481 Neoplasm of uncertain behavior of connective and other soft tissue: Secondary | ICD-10-CM | POA: Diagnosis not present

## 2017-11-14 DIAGNOSIS — C490 Malignant neoplasm of connective and soft tissue of head, face and neck: Secondary | ICD-10-CM | POA: Diagnosis not present

## 2017-11-14 DIAGNOSIS — L821 Other seborrheic keratosis: Secondary | ICD-10-CM | POA: Diagnosis not present

## 2017-11-14 DIAGNOSIS — D229 Melanocytic nevi, unspecified: Secondary | ICD-10-CM | POA: Diagnosis not present

## 2017-11-17 ENCOUNTER — Encounter: Payer: Self-pay | Admitting: Podiatry

## 2017-11-17 ENCOUNTER — Ambulatory Visit (INDEPENDENT_AMBULATORY_CARE_PROVIDER_SITE_OTHER): Payer: Medicare Other | Admitting: Podiatry

## 2017-11-17 DIAGNOSIS — M722 Plantar fascial fibromatosis: Secondary | ICD-10-CM

## 2017-11-17 NOTE — Progress Notes (Signed)
Subjective:   Patient ID: Sylvia [name], female   DOB: 72 y.o.   MRN: [id number]   HPI Patient presents stating she has had pain in both heels and made it hard to walk with the left one being worse than the right one.  States is been going on for fairly long period of time and has history of neuropathy and other issues but the pain in the heels is really been sore.  Patient does not smoke likes to be active   Review of Systems  All other systems reviewed and are negative.       Objective:  Physical Exam  Constitutional: She appears well-developed and well-nourished.  Cardiovascular: Intact distal pulses.  Pulmonary/Chest: Effort normal.  Musculoskeletal: Normal range of motion.  Neurological: She is alert.  Skin: Skin is warm.  Nursing note and vitals reviewed.   Neurovascular status found to be intact muscle strength was adequate range of motion within normal limits.  Patient's found to have exquisite discomfort in the plantar aspect of the heel left over right with inflammation fluid around the medial band and irritation upon palpation.  Patient is noted to have good digital perfusion well oriented x3     Assessment:  Acute plantar fasciitis bilateral heels with moderate neuropathic symptoms     Plan:  H&P x-rays reviewed and today I injected the plantar fascial bilateral 3 mg Kenalog 5 mg Xylocaine advised on physical therapy anti-inflammatory supportive shoes and discussed neuropathy and possible treatments in future.  Reappoint to recheck 3 weeks to see response  X-ray indicates small spur no indication of stress fracture or arthritis

## 2017-11-19 NOTE — Progress Notes (Signed)
Subjective:   Patient ID: Sylvia [name], female   DOB: 72 y.o.   MRN: [id number]   HPI Patient presents stating her heel is feeling quite a bit better with occasional discomfort currently   ROS      Objective:  Physical Exam  Neurovascular status intact negative Homans sign was noted with patient's heel continuing to show mild swelling but improved with discomfort upon deep palpation to the heel itself     Assessment:  Improvement plantar fasciitis symptoms with pain still present upon deep palpation     Plan:  H&P condition reviewed and recommended the continuation of physical therapy anti-inflammatories and supportive shoe gear usage.  Patient will be seen back if symptoms were to persist or get worse signed visit

## 2017-11-25 ENCOUNTER — Encounter: Payer: Self-pay | Admitting: Internal Medicine

## 2017-12-08 DIAGNOSIS — C4449 Other specified malignant neoplasm of skin of scalp and neck: Secondary | ICD-10-CM | POA: Diagnosis not present

## 2017-12-08 DIAGNOSIS — L905 Scar conditions and fibrosis of skin: Secondary | ICD-10-CM | POA: Diagnosis not present

## 2017-12-26 ENCOUNTER — Other Ambulatory Visit: Payer: Self-pay | Admitting: Internal Medicine

## 2017-12-30 ENCOUNTER — Encounter: Payer: Self-pay | Admitting: Internal Medicine

## 2017-12-30 DIAGNOSIS — D126 Benign neoplasm of colon, unspecified: Secondary | ICD-10-CM | POA: Diagnosis not present

## 2017-12-30 DIAGNOSIS — K5730 Diverticulosis of large intestine without perforation or abscess without bleeding: Secondary | ICD-10-CM | POA: Diagnosis not present

## 2017-12-30 DIAGNOSIS — K640 First degree hemorrhoids: Secondary | ICD-10-CM | POA: Diagnosis not present

## 2017-12-30 DIAGNOSIS — K635 Polyp of colon: Secondary | ICD-10-CM | POA: Diagnosis not present

## 2017-12-30 DIAGNOSIS — Z800 Family history of malignant neoplasm of digestive organs: Secondary | ICD-10-CM | POA: Diagnosis not present

## 2017-12-30 LAB — HM COLONOSCOPY

## 2018-01-02 DIAGNOSIS — K635 Polyp of colon: Secondary | ICD-10-CM | POA: Diagnosis not present

## 2018-01-02 DIAGNOSIS — D126 Benign neoplasm of colon, unspecified: Secondary | ICD-10-CM | POA: Diagnosis not present

## 2018-01-08 ENCOUNTER — Encounter: Payer: Self-pay | Admitting: *Deleted

## 2018-02-09 ENCOUNTER — Ambulatory Visit (INDEPENDENT_AMBULATORY_CARE_PROVIDER_SITE_OTHER): Payer: Medicare Other

## 2018-02-09 DIAGNOSIS — Z23 Encounter for immunization: Secondary | ICD-10-CM

## 2018-02-13 DIAGNOSIS — Z1231 Encounter for screening mammogram for malignant neoplasm of breast: Secondary | ICD-10-CM | POA: Diagnosis not present

## 2018-02-13 DIAGNOSIS — M8589 Other specified disorders of bone density and structure, multiple sites: Secondary | ICD-10-CM | POA: Diagnosis not present

## 2018-02-13 DIAGNOSIS — Z853 Personal history of malignant neoplasm of breast: Secondary | ICD-10-CM | POA: Diagnosis not present

## 2018-02-13 LAB — HM MAMMOGRAPHY

## 2018-02-13 LAB — HM DEXA SCAN

## 2018-02-18 ENCOUNTER — Encounter: Payer: Self-pay | Admitting: *Deleted

## 2018-02-19 ENCOUNTER — Telehealth: Payer: Self-pay | Admitting: *Deleted

## 2018-02-19 NOTE — Telephone Encounter (Signed)
Patient returned call. Results given and agreed. Patient is willing to take Osteoporosis medication but does NOT want Boniva, stated she had a bad reaction to it in the past. Please Advise.

## 2018-02-19 NOTE — Telephone Encounter (Signed)
Ok.  I would expect that she would have a similar response to fosamax as boniva.   I would suggest prolia as the next potential treatment.  This is a twice a year injection in our office.  We would need to check with her insurance about coverage prior to initiating it.

## 2018-02-19 NOTE — Telephone Encounter (Signed)
Per Dr. Mariea Clonts ( results will be scanned in):  Mammogram- normal, repeat in 1 year  Bone density- bone density has declined since last evaluation 107yrs ago. Due to prior tamoxifen +this change I recommend osteoporosis medication   .left message to have patient return my call.

## 2018-02-20 ENCOUNTER — Encounter: Payer: Self-pay | Admitting: Internal Medicine

## 2018-02-20 NOTE — Telephone Encounter (Signed)
Routed to [place].

## 2018-02-23 NOTE — Telephone Encounter (Signed)
Patient sent mychart message.

## 2018-02-24 ENCOUNTER — Encounter: Payer: Self-pay | Admitting: *Deleted

## 2018-04-14 ENCOUNTER — Ambulatory Visit (INDEPENDENT_AMBULATORY_CARE_PROVIDER_SITE_OTHER): Payer: Medicare Other

## 2018-04-14 ENCOUNTER — Telehealth: Payer: Self-pay

## 2018-04-14 VITALS — BP 120/60 | HR 78 | Temp 98.0°F | Ht 60.0 in | Wt 153.0 lb

## 2018-04-14 DIAGNOSIS — Z723 Lack of physical exercise: Secondary | ICD-10-CM | POA: Diagnosis not present

## 2018-04-14 DIAGNOSIS — Z0000 Encounter for general adult medical examination without abnormal findings: Secondary | ICD-10-CM | POA: Diagnosis not present

## 2018-04-14 NOTE — Progress Notes (Signed)
Subjective:   Sylvia [name] is a 72 y.o. female who presents for Medicare Annual (Subsequent) preventive examination.  Last AWV-04/11/2017    Objective:     Vitals: BP 120/60 (BP Location: Left Arm, Patient Position: Sitting)   Pulse 78   Temp 98 F (36.7 C) (Oral)   Ht 5' (1.524 m)   Wt 153 lb (69.4 kg)   SpO2 98%   BMI 29.88 kg/m   Body mass index is 29.88 kg/m.  Advanced Directives 04/14/2018 10/13/2017 04/11/2017 10/14/2016 10/14/2016 04/15/2016 04/11/2016  Does Patient Have a Medical Advance Directive? Yes Yes Yes Yes No Yes Yes  Type of Paramedic of [place];Living will [place];Living will Living will - - [place]  Does patient want to make changes to medical advance directive? No - Patient declined No - Patient declined No - Patient declined No - Patient declined No - Patient declined - -  Copy of [place] in Chart? Yes - validated most recent copy scanned in chart (See row information) Yes No - copy requested - - No - copy requested No - copy requested  Would patient like information on creating a medical advance directive? - - - No - Patient declined - - -    Tobacco Social History   Tobacco Use  Smoking Status Former Smoker  . Packs/day: 0.25  . Last attempt to quit: 07/22/1980  . Years since quitting: 37.7  Smokeless Tobacco Never Used     Counseling given: Not Answered   Clinical Intake:  Pre-visit preparation completed: No  Pain : No/denies pain     Nutritional Risks: None Diabetes: No  How often do you need to have someone help you when you read instructions, pamphlets, or other written materials from your doctor or pharmacy?: 1 - Never What is the last grade level you completed in school?: 12th grade  Interpreter Needed?: No  Information entered by :: [name], RN  Past Medical History:  Diagnosis Date  . Aphasia   .  Arthritis    OA- fingers, toes, back pain, hip pain   . Breast cancer of upper-outer quadrant of left female breast ([place])    left breast cancer  . Complication of anesthesia    slow to wake up  . Disorder of bone and cartilage, unspecified   . Diverticulitis   . Hypothyroid   . Lumbago   . Malignant neoplasm of breast (female), unspecified site   . Normal nuclear stress test    [facility], 07/18/2011  . Osteoarthritis (arthritis due to wear and tear of joints)   . Pain in joint, pelvic region and thigh   . Reflux esophagitis    pt. knowledgeable about dietary intake relative to reflux  . S/P bilateral breast biopsy 2013  . Shortness of breath    /w housework  . Symptomatic menopausal or female climacteric states    Past Surgical History:  Procedure Laterality Date  . BREAST BIOPSY  07/26/2011   Procedure: BREAST BIOPSY WITH NEEDLE LOCALIZATION;  Surgeon: Rolm Bookbinder, MD;  Location: [place];  Service: General;  Laterality: Right;  Right breast wire guided biospy Needle localization @ [facility]  7:30  . BREAST SURGERY     Procedure: BREAST BIOPSY WITH NEEDLE LOCALIZATION;  [place]  . CATARACT EXTRACTION  2012  . DILATION AND CURETTAGE OF UTERUS  1978  . ECTOPIC PREGNANCY SURGERY     1980  .  EYE SURGERY     R cat. extracted /w IOL  . OVARIAN CYST SURGERY    . TONSILLECTOMY     as a child   Family History  Problem Relation Age of Onset  . Cancer Sister        colon  . Cancer Brother        renal  . Heart disease Brother   . Cancer Mother        leukemia  . Peripheral vascular disease Mother   . Cancer Father        lung  . Heart attack Brother   . Cancer Maternal Aunt        breast  . Cancer Cousin   . Cancer Cousin   . Anesthesia problems Neg Hx    Social History   Socioeconomic History  . Marital status: Married    Spouse name: Not on file  . Number of children: Not on file  . Years of education: Not on file  . Highest education level: Not on file    Occupational History  . Not on file  Social Needs  . Financial resource strain: Not hard at all  . Food insecurity:    Worry: Never true    Inability: Never true  . Transportation needs:    Medical: No    Non-medical: No  Tobacco Use  . Smoking status: Former Smoker    Packs/day: 0.25    Last attempt to quit: 07/22/1980    Years since quitting: 37.7  . Smokeless tobacco: Never Used  Substance and Sexual Activity  . Alcohol use: No  . Drug use: No  . Sexual activity: Not Currently    Birth control/protection: None  Lifestyle  . Physical activity:    Days per week: 0 days    Minutes per session: 0 min  . Stress: Rather much  Relationships  . Social connections:    Talks on phone: Three times a week    Gets together: More than three times a week    Attends religious service: Never    Active member of club or organization: No    Attends meetings of clubs or organizations: Never    Relationship status: Married  Other Topics Concern  . Not on file  Social History Narrative  . Not on file    Outpatient Encounter Medications as of 04/14/2018  Medication Sig  . CALCIUM CARBONATE-VITAMIN D PO Take 400 mg by mouth.  . cholecalciferol (VITAMIN D) 1000 UNITS tablet Take 1,000 Units by mouth 2 (two) times daily.  . COD LIVER OIL/VITAMINS A & D PO Take 415 mg by mouth daily.  . diclofenac sodium (VOLTAREN) 1 % GEL Apply 2 g topically daily.  Marland Kitchen gabapentin (NEURONTIN) 100 MG capsule TAKE ONE CAPSULE BY MOUTH AT BEDTIME (Patient taking differently: TAKE TWO CAPSULE BY MOUTH AT BEDTIME)  . ibuprofen (ADVIL,MOTRIN) 100 MG tablet Take 400 mg by mouth every 6 (six) hours as needed. For pain  . NONFORMULARY OR COMPOUNDED ITEM 1 tablet. Move free for joint health  . NONFORMULARY OR COMPOUNDED ITEM 1 tablet. Tumeric  . SYNTHROID 88 MCG tablet TAKE 1 TABLET (88 MCG TOTAL) BY MOUTH DAILY BEFORE BREAKFAST.  Marland Kitchen vitamin E 400 UNIT capsule Take 400 Units by mouth 2 (two) times daily.  .  [DISCONTINUED] Calcium Carbonate-Vit D-Min (CALTRATE 600+D PLUS) 600-400 MG-UNIT per tablet Take 2 tablets by mouth daily.   No facility-administered encounter medications on file as of 04/14/2018.  Activities of Daily Living In your present state of health, do you have any difficulty performing the following activities: 04/14/2018  Hearing? N  Vision? N  Difficulty concentrating or making decisions? N  Walking or climbing stairs? N  Dressing or bathing? N  Doing errands, shopping? N  Preparing Food and eating ? N  Using the Toilet? N  In the past six months, have you accidently leaked urine? N  Do you have problems with loss of bowel control? N  Managing your Medications? N  Managing your Finances? N  Housekeeping or managing your Housekeeping? N  Some recent data might be hidden    Patient Care Team: Gayland Curry, DO as PCP - General (Geriatric Medicine) Starlyn Skeans as Physician Assistant (Dermatology) Magrinat, Virgie Dad, MD as Consulting Physician (Oncology) Wilford Corner, MD as Consulting Physician (Gastroenterology)    Assessment:   This is a routine wellness examination for [facility].  Exercise Activities and Dietary recommendations Current Exercise Habits: The patient does not participate in regular exercise at present, Exercise limited by: Other - see comments(pain in feet)  Goals    . Weight (lb) < 145 lb (65.8 kg)     Starting 04/11/16, I will attempt to decrease my weight to get to 145-147lbs.     . Weight (lb) < 147 lb (66.7 kg)       Fall Risk Fall Risk  04/14/2018 10/13/2017 04/11/2017 10/14/2016 04/15/2016  Falls in the past year? 0 No No No No  Number falls in past yr: 0 - - - -  Injury with Fall? 0 - - - -   Is the patient's home free of loose throw rugs in walkways, pet beds, electrical cords, etc?   yes      Grab bars in the bathroom? yes      Handrails on the stairs?   yes      Adequate lighting?   yes  Depression  Screen PHQ 2/9 Scores 04/14/2018 10/13/2017 04/11/2017 10/14/2016  PHQ - 2 Score 0 0 0 0  PHQ- 9 Score - - - -     Cognitive Function MMSE - Mini Mental State Exam 04/14/2018 04/11/2017 04/11/2016 04/13/2015 04/08/2014  Orientation to time 5 4 5 5 5   Orientation to Place 5 5 5 5 5   Registration 3 3 3 3 3   Attention/ Calculation 5 5 5 5 5   Recall 3 3 3 3 2   Language- name 2 objects 2 2 2 2 2   Language- repeat 1 1 1 1 1   Language- follow 3 step command 3 3 3 3 3   Language- read & follow direction 1 1 1 1 1   Write a sentence 1 1 1 1 1   Copy design 1 1 1 1 1   Total score 30 29 30 30 29         Immunization History  Administered Date(s) Administered  . Influenza Whole 03/18/2014  . Influenza, High Dose Seasonal PF 02/10/2017, 02/09/2018  . Influenza, Seasonal, Injecte, Preservative Fre 02/01/2016  . Influenza-Unspecified 02/05/2010, 03/30/2012, 04/02/2013, 02/21/2015  . Pneumococcal Conjugate-13 04/08/2014  . Pneumococcal Polysaccharide-23 09/28/2012  . Td 04/29/1978  . Tdap 04/15/2013  . Zoster 01/24/2014    Qualifies for Shingles Vaccine? Yes ,educated and ordered to pharmacy  Screening Tests Health Maintenance  Topic Date Due  . MAMMOGRAM  02/14/2020  . COLONOSCOPY  12/31/2022  . TETANUS/TDAP  04/16/2023  . INFLUENZA VACCINE  Completed  . DEXA SCAN  Completed  . Hepatitis C Screening  Completed  . PNA vac Low Risk Adult  Completed    Cancer Screenings: Lung: Low Dose CT Chest recommended if Age 65-80 years, 30 pack-year currently smoking OR have quit w/in 15years. Patient does not qualify. Breast:  Up to date on Mammogram? Yes   Up to date of Bone Density/Dexa? Yes Colorectal: up to date  Additional Screenings:  Hepatitis C Screening: declined     Plan:    I have personally reviewed and addressed the Medicare Annual Wellness questionnaire and have noted the following in the patient's chart:  A. Medical and social history B. Use of alcohol, tobacco or illicit  drugs  C. Current medications and supplements D. Functional ability and status E.  Nutritional status F.  Physical activity G. Advance directives H. List of other physicians I.  Hospitalizations, surgeries, and ER visits in previous 12 months J.  [place] to include hearing, vision, cognitive, depression L. Referrals and appointments - C3  In addition, I have reviewed and discussed with patient certain preventive protocols, quality metrics, and best practice recommendations. A written personalized care plan for preventive services as well as general preventive health recommendations were provided to patient.  See attached scanned questionnaire for additional information.   Signed,   [name], RN Nurse Health Advisor  Patient Concerns: Requested Nerve Conduction Study to be done now that she has tried other options and they have failed. Requested a prescription for PRN anxiety/stress

## 2018-04-14 NOTE — Patient Instructions (Signed)
Sylvia [name] , Thank you for taking time to come for your Medicare Wellness Visit. I appreciate your ongoing commitment to your health goals. Please review the following plan we discussed and let me know if I can assist you in the future.   Screening recommendations/referrals: Colonoscopy up to date, due 12/31/2027 Mammogram up to date, due 02/14/2019 Bone Density up to date Recommended yearly ophthalmology/optometry visit for glaucoma screening and checkup Recommended yearly dental visit for hygiene and checkup  Vaccinations: Influenza vaccine up to date Pneumococcal vaccine up to date, completed Tdap vaccine up to date, due 04/16/2023 Shingles vaccine due, ordered to pharmacy  Advanced directives: In chart  Conditions/risks identified: none  Next appointment: Dr. Mariea Clonts 04/20/2018 @ 11am            Medicare Wellness Visit 04/19/2019 @ 10am   Preventive Care 18 Years and Older, Female Preventive care refers to lifestyle choices and visits with your health care provider that can promote health and wellness. What does preventive care include?  A yearly physical exam. This is also called an annual well check.  Dental exams once or twice a year.  Routine eye exams. Ask your health care provider how often you should have your eyes checked.  Personal lifestyle choices, including:  Daily care of your teeth and gums.  Regular physical activity.  Eating a healthy diet.  Avoiding tobacco and drug use.  Limiting alcohol use.  Practicing safe sex.  Taking low-dose aspirin every day.  Taking vitamin and mineral supplements as recommended by your health care provider. What happens during an annual well check? The services and screenings done by your health care provider during your annual well check will depend on your age, overall health, lifestyle risk factors, and family history of disease. Counseling  Your health care provider may ask you questions about your:  Alcohol  use.  Tobacco use.  Drug use.  Emotional well-being.  Home and relationship well-being.  Sexual activity.  Eating habits.  History of falls.  Memory and ability to understand (cognition).  Work and work Statistician.  Reproductive health. Screening  You may have the following tests or measurements:  Height, weight, and BMI.  Blood pressure.  Lipid and cholesterol levels. These may be checked every 5 years, or more frequently if you are over 40 years old.  Skin check.  Lung cancer screening. You may have this screening every year starting at age 73 if you have a 30-pack-year history of smoking and currently smoke or have quit within the past 15 years.  Fecal occult blood test (FOBT) of the stool. You may have this test every year starting at age 38.  Flexible sigmoidoscopy or colonoscopy. You may have a sigmoidoscopy every 5 years or a colonoscopy every 10 years starting at age 71.  Hepatitis C blood test.  Hepatitis B blood test.  Sexually transmitted disease (STD) testing.  Diabetes screening. This is done by checking your blood sugar (glucose) after you have not eaten for a while (fasting). You may have this done every 1-3 years.  Bone density scan. This is done to screen for osteoporosis. You may have this done starting at age 20.  Mammogram. This may be done every 1-2 years. Talk to your health care provider about how often you should have regular mammograms. Talk with your health care provider about your test results, treatment options, and if necessary, the need for more tests. Vaccines  Your health care provider may recommend certain vaccines, such as:  Influenza vaccine. This is recommended every year.  Tetanus, diphtheria, and acellular pertussis (Tdap, Td) vaccine. You may need a Td booster every 10 years.  Zoster vaccine. You may need this after age 17.  Pneumococcal 13-valent conjugate (PCV13) vaccine. One dose is recommended after age  66.  Pneumococcal polysaccharide (PPSV23) vaccine. One dose is recommended after age 72. Talk to your health care provider about which screenings and vaccines you need and how often you need them. This information is not intended to replace advice given to you by your health care provider. Make sure you discuss any questions you have with your health care provider. Document Released: 05/12/2015 Document Revised: 01/03/2016 Document Reviewed: 02/14/2015 Elsevier Interactive Patient Education  2017 [place] Prevention in the Home Falls can cause injuries. They can happen to people of all ages. There are many things you can do to make your home safe and to help prevent falls. What can I do on the outside of my home?  Regularly fix the edges of walkways and driveways and fix any cracks.  Remove anything that might make you trip as you walk through a door, such as a raised step or threshold.  Trim any bushes or trees on the path to your home.  Use bright outdoor lighting.  Clear any walking paths of anything that might make someone trip, such as rocks or tools.  Regularly check to see if handrails are loose or broken. Make sure that both sides of any steps have handrails.  Any raised decks and porches should have guardrails on the edges.  Have any leaves, snow, or ice cleared regularly.  Use sand or salt on walking paths during winter.  Clean up any spills in your garage right away. This includes oil or grease spills. What can I do in the bathroom?  Use night lights.  Install grab bars by the toilet and in the tub and shower. Do not use towel bars as grab bars.  Use non-skid mats or decals in the tub or shower.  If you need to sit down in the shower, use a plastic, non-slip stool.  Keep the floor dry. Clean up any water that spills on the floor as soon as it happens.  Remove soap buildup in the tub or shower regularly.  Attach bath mats securely with double-sided  non-slip rug tape.  Do not have throw rugs and other things on the floor that can make you trip. What can I do in the bedroom?  Use night lights.  Make sure that you have a light by your bed that is easy to reach.  Do not use any sheets or blankets that are too big for your bed. They should not hang down onto the floor.  Have a firm chair that has side arms. You can use this for support while you get dressed.  Do not have throw rugs and other things on the floor that can make you trip. What can I do in the kitchen?  Clean up any spills right away.  Avoid walking on wet floors.  Keep items that you use a lot in easy-to-reach places.  If you need to reach something above you, use a strong step stool that has a grab bar.  Keep electrical cords out of the way.  Do not use floor polish or wax that makes floors slippery. If you must use wax, use non-skid floor wax.  Do not have throw rugs and other things on the floor that  can make you trip. What can I do with my stairs?  Do not leave any items on the stairs.  Make sure that there are handrails on both sides of the stairs and use them. Fix handrails that are broken or loose. Make sure that handrails are as long as the stairways.  Check any carpeting to make sure that it is firmly attached to the stairs. Fix any carpet that is loose or worn.  Avoid having throw rugs at the top or bottom of the stairs. If you do have throw rugs, attach them to the floor with carpet tape.  Make sure that you have a light switch at the top of the stairs and the bottom of the stairs. If you do not have them, ask someone to add them for you. What else can I do to help prevent falls?  Wear shoes that:  Do not have high heels.  Have rubber bottoms.  Are comfortable and fit you well.  Are closed at the toe. Do not wear sandals.  If you use a stepladder:  Make sure that it is fully opened. Do not climb a closed stepladder.  Make sure that both  sides of the stepladder are locked into place.  Ask someone to hold it for you, if possible.  Clearly mark and make sure that you can see:  Any grab bars or handrails.  First and last steps.  Where the edge of each step is.  Use tools that help you move around (mobility aids) if they are needed. These include:  Canes.  Walkers.  Scooters.  Crutches.  Turn on the lights when you go into a dark area. Replace any light bulbs as soon as they burn out.  Set up your furniture so you have a clear path. Avoid moving your furniture around.  If any of your floors are uneven, fix them.  If there are any pets around you, be aware of where they are.  Review your medicines with your doctor. Some medicines can make you feel dizzy. This can increase your chance of falling. Ask your doctor what other things that you can do to help prevent falls. This information is not intended to replace advice given to you by your health care provider. Make sure you discuss any questions you have with your health care provider. Document Released: 02/09/2009 Document Revised: 09/21/2015 Document Reviewed: 05/20/2014 Elsevier Interactive Patient Education  2017 [employer].

## 2018-04-14 NOTE — Telephone Encounter (Signed)
04/14/18 Spoke with patient about [place] programs in her [place]

## 2018-04-20 ENCOUNTER — Ambulatory Visit (INDEPENDENT_AMBULATORY_CARE_PROVIDER_SITE_OTHER): Payer: Medicare Other | Admitting: Internal Medicine

## 2018-04-20 ENCOUNTER — Encounter: Payer: Self-pay | Admitting: Internal Medicine

## 2018-04-20 VITALS — BP 122/68 | HR 66 | Ht 60.0 in | Wt 156.0 lb

## 2018-04-20 DIAGNOSIS — E7800 Pure hypercholesterolemia, unspecified: Secondary | ICD-10-CM | POA: Diagnosis not present

## 2018-04-20 DIAGNOSIS — M150 Primary generalized (osteo)arthritis: Secondary | ICD-10-CM

## 2018-04-20 DIAGNOSIS — F419 Anxiety disorder, unspecified: Secondary | ICD-10-CM | POA: Diagnosis not present

## 2018-04-20 DIAGNOSIS — M722 Plantar fascial fibromatosis: Secondary | ICD-10-CM | POA: Insufficient documentation

## 2018-04-20 DIAGNOSIS — M8589 Other specified disorders of bone density and structure, multiple sites: Secondary | ICD-10-CM | POA: Diagnosis not present

## 2018-04-20 DIAGNOSIS — E039 Hypothyroidism, unspecified: Secondary | ICD-10-CM | POA: Diagnosis not present

## 2018-04-20 DIAGNOSIS — G629 Polyneuropathy, unspecified: Secondary | ICD-10-CM | POA: Diagnosis not present

## 2018-04-20 DIAGNOSIS — M8949 Other hypertrophic osteoarthropathy, multiple sites: Secondary | ICD-10-CM

## 2018-04-20 DIAGNOSIS — Z853 Personal history of malignant neoplasm of breast: Secondary | ICD-10-CM

## 2018-04-20 DIAGNOSIS — M159 Polyosteoarthritis, unspecified: Secondary | ICD-10-CM

## 2018-04-20 MED ORDER — GABAPENTIN 100 MG PO CAPS: ORAL_CAPSULE | ORAL | 3 refills | Status: DC

## 2018-04-20 MED ORDER — LORAZEPAM 0.5 MG PO TABS: 0.5000 mg | ORAL_TABLET | Freq: Every day | ORAL | 0 refills | Status: DC | PRN

## 2018-04-20 NOTE — Patient Instructions (Signed)
You may try gabapentin 100mg  in the morning to help your burning pain--do this on a day when you plan to stay home.   You may continue your 200mg  at bedtime.

## 2018-04-20 NOTE — Progress Notes (Signed)
Location:  [facility] clinic Provider:  Anahit Klumb L. Mariea Clonts, D.O., C.M.D.  Goals of Care:  Advanced Directives 04/14/2018  Does Patient Have a Medical Advance Directive? Yes  Type of Advance Directive [place]  Does patient want to make changes to medical advance directive? No - Patient declined  Copy of [place] in Chart? Yes - validated most recent copy scanned in chart (See row information)  Would patient like information on creating a medical advance directive? -     Chief Complaint  Patient presents with  . Medical Management of Chronic Issues    53mth follow-up    HPI: Patient is a 72 y.o. female seen today for medical management of chronic diseases.    She's wondering about something for her nerves.  Does not need it everyday, but when her husband has an episode, she needs something to not go off.   He will be dissatisfied with things.  It looks like effexor is what made her sick before.  She has no problems sleeping.    She takes two gabapentin before bed for her neuropathy.  Sleeps well.  Still cannot get out to do regular walking.  Tumeric at supper helps some .  She went out Christmas shopping and had to cut it short after an hour or so.  She's going to get some fitted shoes at [employer].  Shots in her feet worked for a couple of weeks.  She has done the stretches like I recommended and two others he gave her, but the pain comes back each time she sits a while and she has to do them.  She has gotten Dr. Zoe Lan insoles which help a little.    Past Medical History:  Diagnosis Date  . Aphasia   . Arthritis    OA- fingers, toes, back pain, hip pain   . Breast cancer of upper-outer quadrant of left female breast ([place])    left breast cancer  . Complication of anesthesia    slow to wake up  . Disorder of bone and cartilage, unspecified   . Diverticulitis   . Hypothyroid   . Lumbago   . Malignant neoplasm of breast (female), unspecified  site   . Normal nuclear stress test    [facility], 07/18/2011  . Osteoarthritis (arthritis due to wear and tear of joints)   . Pain in joint, pelvic region and thigh   . Reflux esophagitis    pt. knowledgeable about dietary intake relative to reflux  . S/P bilateral breast biopsy 2013  . Shortness of breath    /w housework  . Symptomatic menopausal or female climacteric states     Past Surgical History:  Procedure Laterality Date  . BREAST BIOPSY  07/26/2011   Procedure: BREAST BIOPSY WITH NEEDLE LOCALIZATION;  Surgeon: Rolm Bookbinder, MD;  Location: [facility];  Service: General;  Laterality: Right;  Right breast wire guided biospy Needle localization @ [facility]  7:30  . BREAST SURGERY     Procedure: BREAST BIOPSY WITH NEEDLE LOCALIZATION;  [facility]  . CATARACT EXTRACTION  2012  . DILATION AND CURETTAGE OF UTERUS  1978  . ECTOPIC PREGNANCY SURGERY     1980  . EYE SURGERY     R cat. extracted /w IOL  . OVARIAN CYST SURGERY    . TONSILLECTOMY     as a child    Allergies  Allergen Reactions  . Benadryl [Diphenhydramine Hcl] Other (See Comments)    "Super hyper"  .  Tape     Skin breakdown/redness   . Boniva [Ibandronate Sodium] Other (See Comments)    Neck, shoulder limited mobility   . Chlorhexidine Gluconate   . Peridin-C [Ascorbic Acid]   . Venlafaxine   . Codeine Other (See Comments)    "can't wake up from it."  . Sulfa Antibiotics Rash    Patient unsure of reaction    Outpatient Encounter Medications as of 04/20/2018  Medication Sig  . CALCIUM CARBONATE-VITAMIN D PO Take 400 mg by mouth.  . cholecalciferol (VITAMIN D) 1000 UNITS tablet Take 1,000 Units by mouth 2 (two) times daily.  . COD LIVER OIL/VITAMINS A & D PO Take 415 mg by mouth daily.  . diclofenac sodium (VOLTAREN) 1 % GEL Apply 2 g topically daily.  Marland Kitchen ibuprofen (ADVIL,MOTRIN) 100 MG tablet Take 400 mg by mouth every 6 (six) hours as needed. For pain  . NONFORMULARY OR COMPOUNDED ITEM 1 tablet. Move free for  joint health  . NONFORMULARY OR COMPOUNDED ITEM 1 tablet. Tumeric  . SYNTHROID 88 MCG tablet TAKE 1 TABLET (88 MCG TOTAL) BY MOUTH DAILY BEFORE BREAKFAST.  Marland Kitchen vitamin E 400 UNIT capsule Take 400 Units by mouth 2 (two) times daily.  . [DISCONTINUED] gabapentin (NEURONTIN) 100 MG capsule Take 200 mg by mouth at bedtime.  . gabapentin (NEURONTIN) 100 MG capsule 1 capsule in am and 2 capsules at bedtime  . LORazepam (ATIVAN) 0.5 MG tablet Take 1 tablet (0.5 mg total) by mouth daily as needed for anxiety.  . [DISCONTINUED] gabapentin (NEURONTIN) 100 MG capsule TAKE ONE CAPSULE BY MOUTH AT BEDTIME (Patient taking differently: TAKE TWO CAPSULE BY MOUTH AT BEDTIME)   No facility-administered encounter medications on file as of 04/20/2018.     Review of Systems:  Review of Systems  Constitutional: Negative for chills, fever and malaise/fatigue.  HENT: Negative for congestion and hearing loss.   Eyes: Negative for blurred vision.  Respiratory: Negative for cough and shortness of breath.   Cardiovascular: Negative for chest pain, palpitations and leg swelling.  Gastrointestinal: Negative for abdominal pain, blood in stool, constipation, diarrhea, heartburn and melena.  Genitourinary: Negative for dysuria.  Musculoskeletal: Positive for joint pain. Negative for falls and myalgias.  Skin: Negative for itching and rash.  Neurological: Positive for tingling and sensory change.  Endo/Heme/Allergies: Does not bruise/bleed easily.  Psychiatric/Behavioral: Negative for depression and memory loss. The patient is nervous/anxious and has insomnia.        Sleeping well with her gabapentin    Health Maintenance  Topic Date Due  . MAMMOGRAM  02/14/2020  . COLONOSCOPY  12/31/2022  . TETANUS/TDAP  04/16/2023  . INFLUENZA VACCINE  Completed  . DEXA SCAN  Completed  . Hepatitis C Screening  Completed  . PNA vac Low Risk Adult  Completed    Physical Exam: Vitals:   04/20/18 1059  BP: 122/68  Pulse: 66    SpO2: 97%  Weight: 156 lb (70.8 kg)  Height: 5' (1.524 m)   Body mass index is 30.47 kg/m. Physical Exam Constitutional:      Appearance: Normal appearance.  HENT:     Head: Normocephalic and atraumatic.  Cardiovascular:     Rate and Rhythm: Normal rate and regular rhythm.     Pulses: Normal pulses.     Heart sounds: Normal heart sounds.  Pulmonary:     Effort: Pulmonary effort is normal.     Breath sounds: Normal breath sounds.  Musculoskeletal: Normal range of motion.  General: No swelling or tenderness.  Skin:    General: Skin is warm and dry.  Neurological:     General: No focal deficit present.     Mental Status: She is alert and oriented to person, place, and time.  Psychiatric:        Mood and Affect: Mood normal.     Labs reviewed: Basic Metabolic Panel: Recent Labs    10/09/17 0903  NA 141  K 4.4  CL 107  CO2 29  GLUCOSE 88  BUN 12  CREATININE 0.76  CALCIUM 8.9  TSH 0.86   Liver Function Tests: Recent Labs    10/09/17 0903  AST 12  ALT 7  BILITOT 0.5  PROT 6.7   No results for input(s): LIPASE, AMYLASE in the last 8760 hours. No results for input(s): AMMONIA in the last 8760 hours. CBC: Recent Labs    10/09/17 0903  WBC 3.7*  NEUTROABS 1,676  HGB 13.6  HCT 39.3  MCV 89.9  PLT 260   Lipid Panel: Recent Labs    10/09/17 0903  CHOL 193  HDL 52  LDLCALC 119*  TRIG 112  CHOLHDL 3.7   No results found for: HGBA1C  Procedures since last visit: No results found.  Assessment/Plan 1. Neuropathy -has gotten worse, will try taking one capsule in the day, continue two at night which has worked well, renal function is good -advised to take first day tablet when she plans to be home to make sure it does not make her too drowsy - gabapentin (NEURONTIN) 100 MG capsule; 1 capsule in am and 2 capsules at bedtime  Dispense: 90 capsule; Refill: 3  2. Heel pain, bilateral -did improve with injections and helped by stretches, but she  struggles with having to do them every time she sits and goes to get back up again -is going to get new shoes from [employer] fitted and did get insoles  3. Primary osteoarthritis involving multiple joints -ongoing, cont same regimen with tylenol, topicals  4. Anxiety - LORazepam (ATIVAN) 0.5 MG tablet; Take 1 tablet (0.5 mg total) by mouth daily as needed for anxiety.  Dispense: 30 tablet; Refill: 0  5. Acquired hypothyroidism -has been therapeutic, cont levothyroxine  6. Osteopenia of multiple sites -cont current regimen and try to walk more  7. History of breast cancer -no longer on treatment--follow regular screening practices  Labs/tests ordered:  No orders of the defined types were placed in this encounter.  Next appt:  6 mos for CPE, fasting labs before  Noha Karasik L. Jahleah Mariscal, D.O. [facility] [address], [place] [address] Cell Phone (Mon-Fri 8am-5pm):  [phone] On Call:  [phone] & follow prompts after 5pm & weekends Office Phone:  [phone] Office Fax:  [phone]

## 2018-06-22 ENCOUNTER — Other Ambulatory Visit: Payer: Self-pay | Admitting: Internal Medicine

## 2018-07-11 IMAGING — DX DG FOOT COMPLETE 3+V*R*
3 series · 3 of 3 positions shown · non-contrast
Comparison: None

CLINICAL DATA: BILATERAL heel pain with burning sensation for 1
month

EXAM:
RIGHT FOOT COMPLETE - 3+ VIEW

[dg foot complete right (1 of 3)]
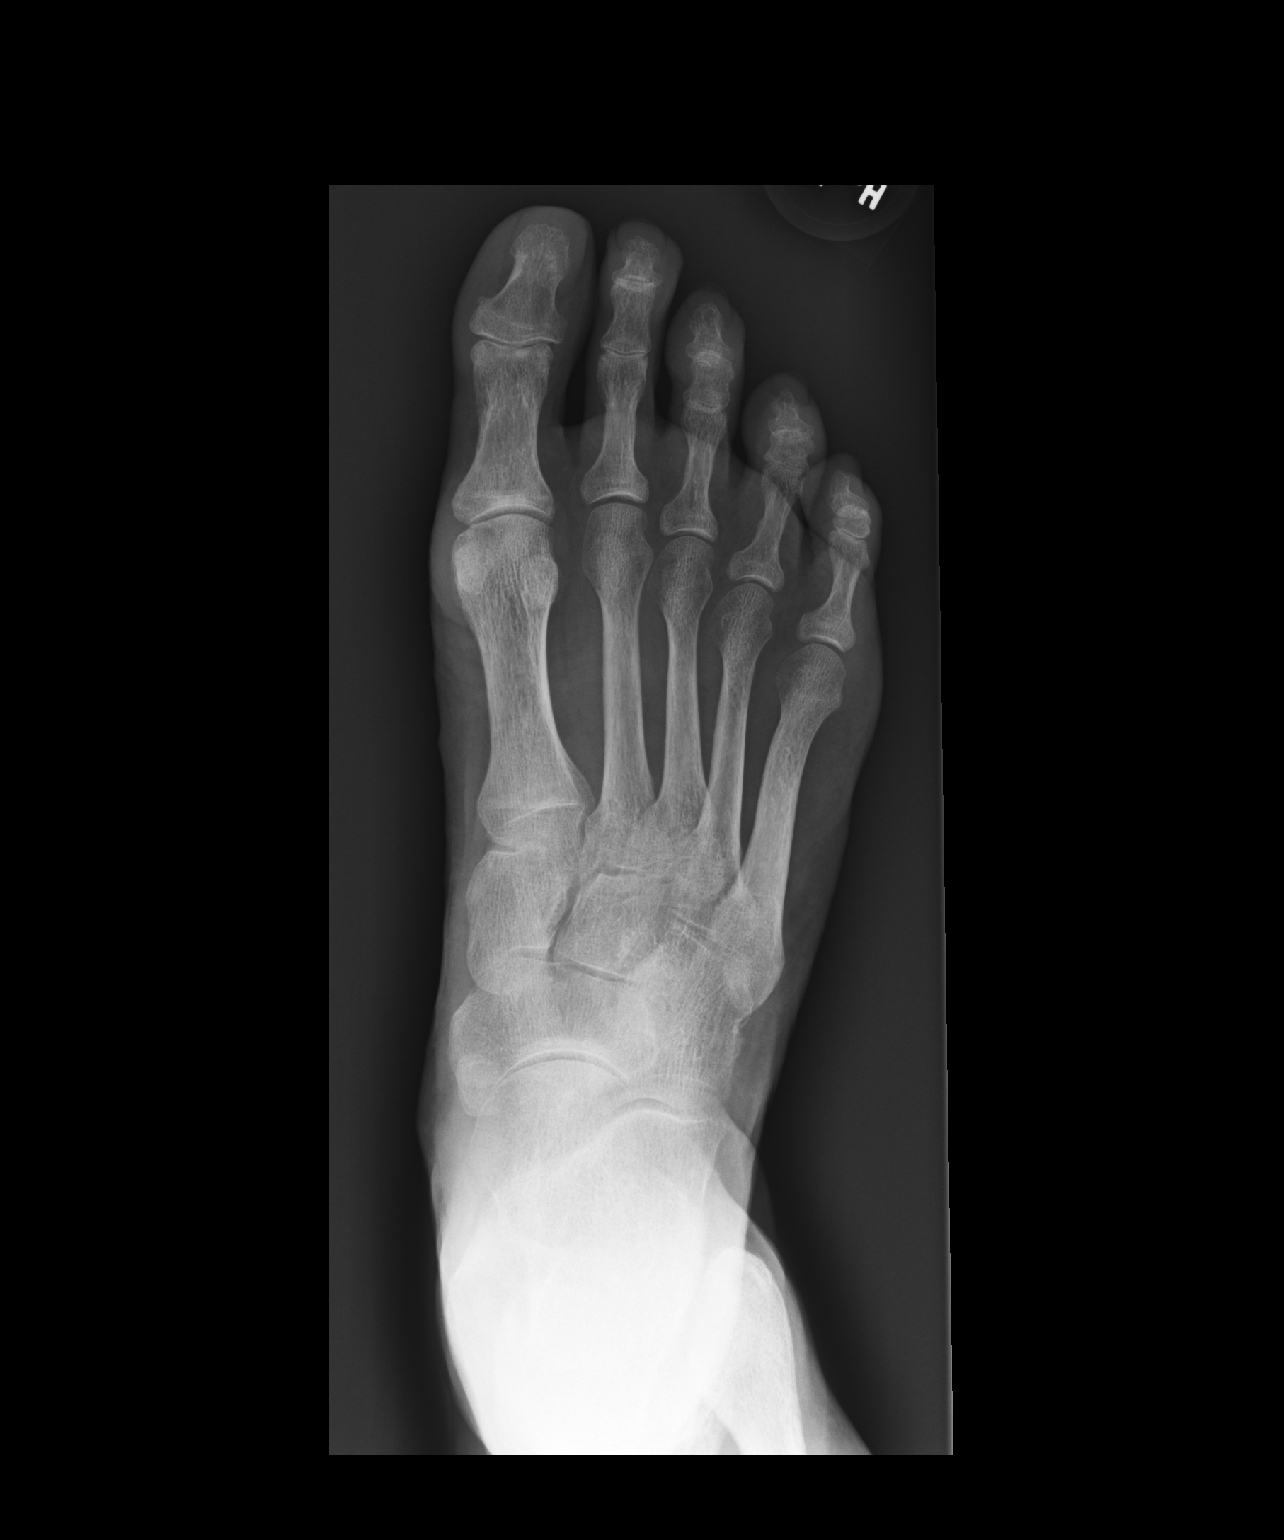

[dg foot complete right (2 of 3)]
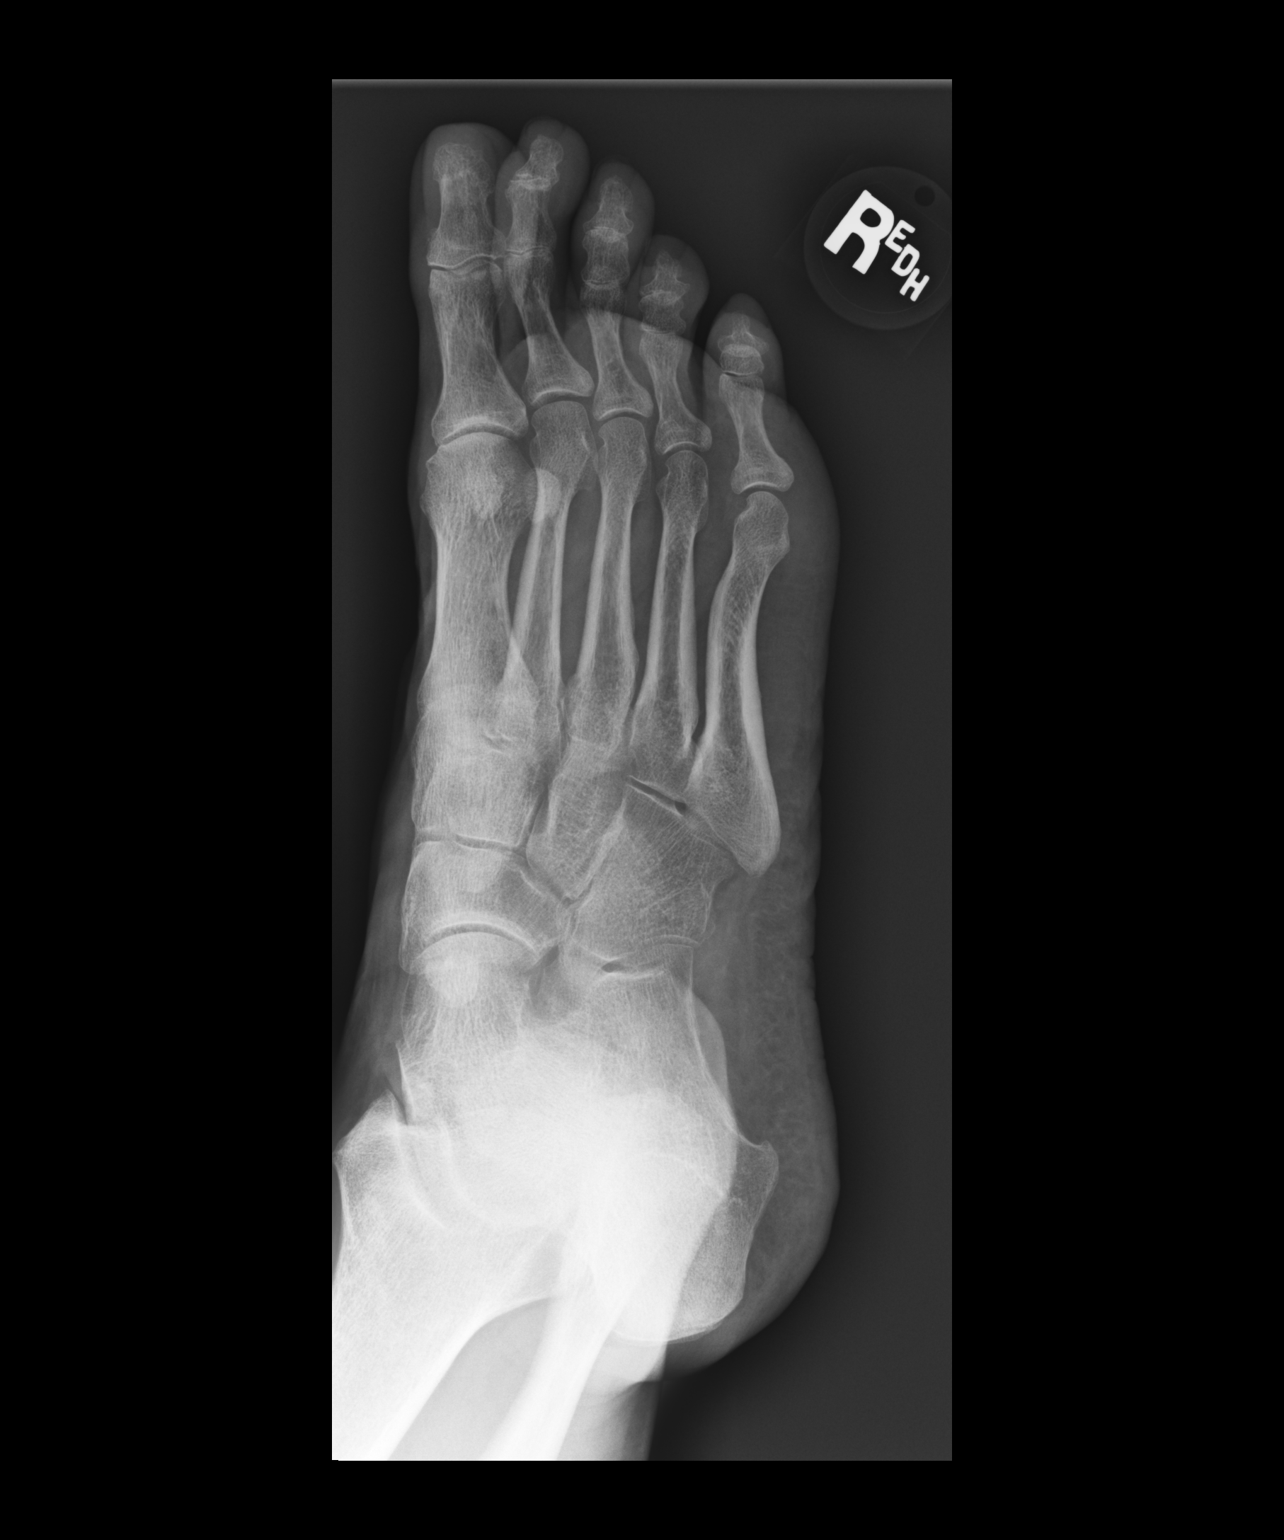

[dg foot complete right (3 of 3)]
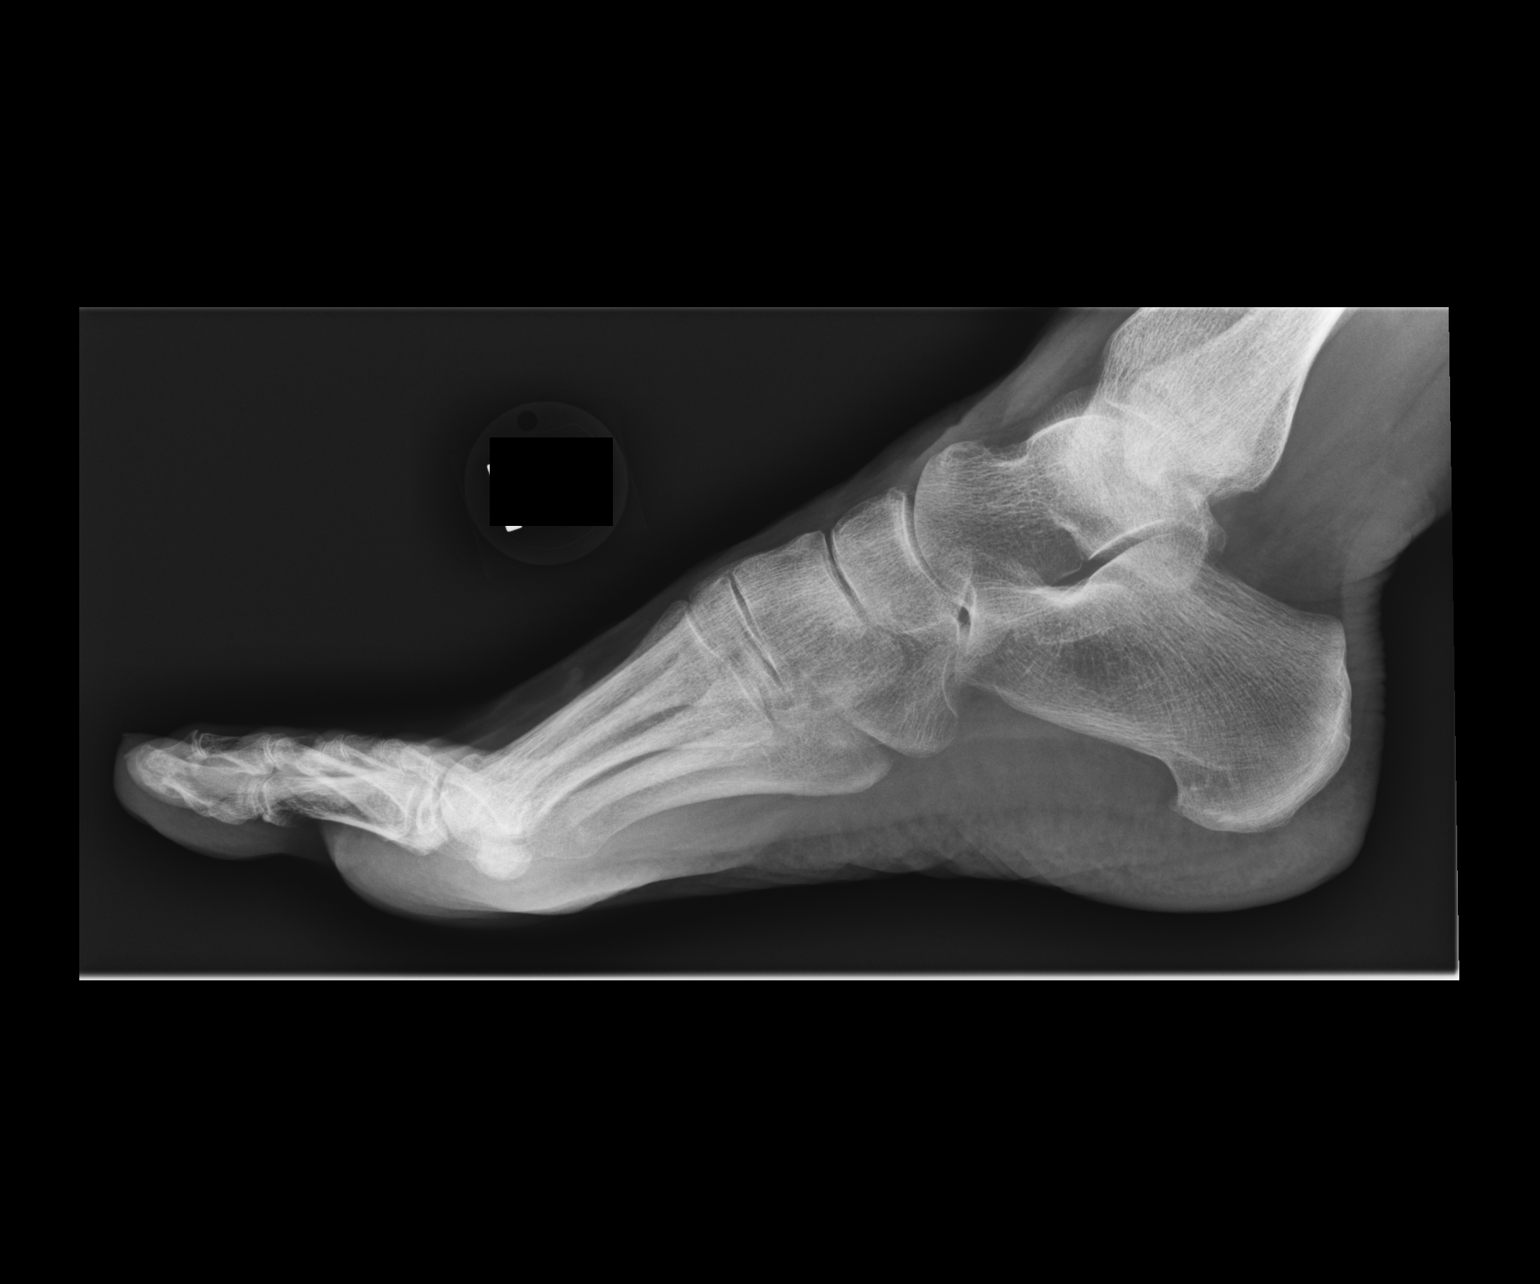

[3 of 3 positions shown; findings below may reference images not displayed]

FINDINGS: Osseous demineralization.

Scattered mild narrowing of interphalangeal joints as well as first
MTP joint.

Non fused ossicle at medial margin of tarsal navicular.

No acute fracture, dislocation, or bone destruction.
IMPRESSION: Scattered degenerative changes at forefoot as above.

No acute abnormalities.

## 2018-07-19 ENCOUNTER — Encounter: Payer: Self-pay | Admitting: Internal Medicine

## 2018-07-20 ENCOUNTER — Other Ambulatory Visit: Payer: Self-pay

## 2018-07-20 ENCOUNTER — Telehealth (INDEPENDENT_AMBULATORY_CARE_PROVIDER_SITE_OTHER): Payer: Medicare Other | Admitting: Internal Medicine

## 2018-07-20 DIAGNOSIS — B9689 Other specified bacterial agents as the cause of diseases classified elsewhere: Secondary | ICD-10-CM | POA: Diagnosis not present

## 2018-07-20 DIAGNOSIS — Z20828 Contact with and (suspected) exposure to other viral communicable diseases: Secondary | ICD-10-CM | POA: Diagnosis not present

## 2018-07-20 DIAGNOSIS — G629 Polyneuropathy, unspecified: Secondary | ICD-10-CM

## 2018-07-20 DIAGNOSIS — Z20822 Contact with and (suspected) exposure to covid-19: Secondary | ICD-10-CM

## 2018-07-20 DIAGNOSIS — J019 Acute sinusitis, unspecified: Secondary | ICD-10-CM | POA: Diagnosis not present

## 2018-07-20 MED ORDER — AZITHROMYCIN 250 MG PO TABS: ORAL_TABLET | ORAL | 0 refills | Status: DC

## 2018-07-20 NOTE — Patient Instructions (Signed)
Please follow these guidelines to prevent spread of Covid-19 in case you do have infection (though you are suspected to have an acute sinusitis).  I have sent in a zpak for you.  Please self-isolate and self-monitor and call me back if you get worse!  Feel better!  Individuals who are confirmed to have, or are being evaluated for, COVID-19 should follow the prevention steps below until a healthcare provider or local or state health department says they can return to normal activities.  Stay home except to get medical care You should restrict activities outside your home, except for getting medical care. Do not go to work, school, or public areas, and do not use public transportation or taxis.  Call ahead before visiting your doctor Before your medical appointment, call the healthcare provider and tell them that you have, or are being evaluated for, COVID-19 infection. This will help the healthcare provider's office take steps to keep other people from getting infected. Ask your healthcare provider to call the local or state health department.  Monitor your symptoms Seek prompt medical attention if your illness is worsening (e.g., difficulty breathing). Before going to your medical appointment, call the healthcare provider and tell them that you have, or are being evaluated for, COVID-19 infection. Ask your healthcare provider to call the local or state health department.  Wear a facemask You should wear a facemask that covers your nose and mouth when you are in the same room with other people and when you visit a healthcare provider. People who live with or visit you should also wear a facemask while they are in the same room with you.  Separate yourself from other people in your home As much as possible, you should stay in a different room from other people in your home. Also, you should use a separate bathroom, if available.  Avoid sharing household items You should not share dishes,  drinking glasses, cups, eating utensils, towels, bedding, or other items with other people in your home. After using these items, you should wash them thoroughly with soap and water.  Cover your coughs and sneezes Cover your mouth and nose with a tissue when you cough or sneeze, or you can cough or sneeze into your sleeve. Throw used tissues in a lined trash can, and immediately wash your hands with soap and water for at least 20 seconds or use an alcohol-based hand rub.  Wash your [facility] your hands often and thoroughly with soap and water for at least 20 seconds. You can use an alcohol-based hand sanitizer if soap and water are not available and if your hands are not visibly dirty. Avoid touching your eyes, nose, and mouth with unwashed hands.   Prevention Steps for Caregivers and Household Members of Individuals Confirmed to have, or Being Evaluated for, COVID-19 Infection Being Cared for in the Home  If you live with, or provide care at home for, a person confirmed to have, or being evaluated for, COVID-19 infection please follow these guidelines to prevent infection:  Follow healthcare provider's instructions Make sure that you understand and can help the patient follow any healthcare provider instructions for all care.  Provide for the patient's basic needs You should help the patient with basic needs in the home and provide support for getting groceries, prescriptions, and other personal needs.  Monitor the patient's symptoms If they are getting sicker, call his or her medical provider and tell them that the patient has, or is being evaluated for, COVID-19  infection. This will help the healthcare provider's office take steps to keep other people from getting infected. Ask the healthcare provider to call the local or state health department.  Limit the number of people who have contact with the patient  If possible, have only one caregiver for the patient.  Other  household members should stay in another home or place of residence. If this is not possible, they should stay  in another room, or be separated from the patient as much as possible. Use a separate bathroom, if available.  Restrict visitors who do not have an essential need to be in the home.  Keep older adults, very young children, and other sick people away from the patient Keep older adults, very young children, and those who have compromised immune systems or chronic health conditions away from the patient. This includes people with chronic heart, lung, or kidney conditions, diabetes, and cancer.  Ensure good ventilation Make sure that shared spaces in the home have good air flow, such as from an air conditioner or an opened window, weather permitting.  Wash your hands often  Wash your hands often and thoroughly with soap and water for at least 20 seconds. You can use an alcohol based hand sanitizer if soap and water are not available and if your hands are not visibly dirty.  Avoid touching your eyes, nose, and mouth with unwashed hands.  Use disposable paper towels to dry your hands. If not available, use dedicated cloth towels and replace them when they become wet.  Wear a facemask and gloves  Wear a disposable facemask at all times in the room and gloves when you touch or have contact with the patient's blood, body fluids, and/or secretions or excretions, such as sweat, saliva, sputum, nasal mucus, vomit, urine, or feces.  Ensure the mask fits over your nose and mouth tightly, and do not touch it during use.  Throw out disposable facemasks and gloves after using them. Do not reuse.  Wash your hands immediately after removing your facemask and gloves.  If your personal clothing becomes contaminated, carefully remove clothing and launder. Wash your hands after handling contaminated clothing.  Place all used disposable facemasks, gloves, and other waste in a lined container before  disposing them with other household waste.  Remove gloves and wash your hands immediately after handling these items.  Do not share dishes, glasses, or other household items with the patient  Avoid sharing household items. You should not share dishes, drinking glasses, cups, eating utensils, towels, bedding, or other items with a patient who is confirmed to have, or being evaluated for, COVID-19 infection.  After the person uses these items, you should wash them thoroughly with soap and water.  Wash laundry thoroughly  Immediately remove and wash clothes or bedding that have blood, body fluids, and/or secretions or excretions, such as sweat, saliva, sputum, nasal mucus, vomit, urine, or feces, on them.  Wear gloves when handling laundry from the patient.  Read and follow directions on labels of laundry or clothing items and detergent. In general, wash and dry with the warmest temperatures recommended on the label.  Clean all areas the individual has used often  Clean all touchable surfaces, such as counters, tabletops, doorknobs, bathroom fixtures, toilets, phones, keyboards, tablets, and bedside tables, every day. Also, clean any surfaces that may have blood, body fluids, and/or secretions or excretions on them.  Wear gloves when cleaning surfaces the patient has come in contact with.  Use a diluted  bleach solution (e.g., dilute bleach with 1 part bleach and 10 parts water) or a household disinfectant with a label that says EPA-registered for coronaviruses. To make a bleach solution at home, add 1 tablespoon of bleach to 1 quart (4 cups) of water. For a larger supply, add  cup of bleach to 1 gallon (16 cups) of water.  Read labels of cleaning products and follow recommendations provided on product labels. Labels contain instructions for safe and effective use of the cleaning product including precautions you should take when applying the product, such as wearing gloves or eye protection  and making sure you have good ventilation during use of the product.  Remove gloves and wash hands immediately after cleaning.  Monitor yourself for signs and symptoms of illness Caregivers and household members are considered close contacts, should monitor their health, and will be asked to limit movement outside of the home to the extent possible. Follow the monitoring steps for close contacts listed on the symptom monitoring form.   ? If you have additional questions, contact your local health department or call the epidemiologist on call at [phone] (available 24/7). ? This guidance is subject to change. For the most up-to-date guidance from [facility], please refer to their website: [email or web address]

## 2018-07-20 NOTE — Progress Notes (Signed)
This service is provided via telemedicine  Location of patient (ex: home, work):  At home  Patient consents to a telephone visit:  Yes  Location of the provider (ex: office, home):  office  Name of any referring provider:  Veverly Larimer L. Kemoni Quesenberry, D.O.  Names of all persons participating in the telemedicine service and their role in the encounter:  Gayland Curry, DO (physician) and Sylvia [name] (pt)  Time spent on call:  13 minutes  Virtual Visit via Telephone Note  I connected with Sylvia [name] on 07/20/18 at  1:00 PM EDT by telephone and verified that I am speaking with the correct person using two identifiers.   I discussed the limitations, risks, security and privacy concerns of performing an evaluation and management service by telephone and the availability of in person appointments. I also discussed with the patient that there may be a patient responsible charge related to this service. The patient expressed understanding and agreed to proceed.   History of Present Illness: Bad head cold 3/5, then flu-like symptoms--achiness.  Felt like she had the flu and stayed in bed.  It finally worked its way through.  She was not around anyone until 3/13 when she went to a gathering and then a wedding Saturday, 3/14.  She never had a fever.  It settled in her head like a sinus infection.  Breathing is ok.  She has a little bit of a scratchy throat that she attributes to drainage.  Last night, she got a call that one of the guests at the wedding tested positive for the virus.  This guest lives out of state and didn't know she had it--she had a low grade temp that spiked and went away and tested positive for covid-19.  Wants to know if she should be tested or if she should take something to clear up her head.  She's never had a temp all week over 98.3.   She hasn't been out with others anyway.  She's actually a ton better than she was 3/5.  No cough, no fever, no chest symptoms.  Just behind her  eyes, blows her nose early in the am and she's a little hoarse.    Tumeric with pepper has helped her neuropathy considerably.  She is taking one daily.  She also is using just 2 gabapentin now.    Observations/Objective: Pt's voice hoarse No coughing during call Alert, oriented and appropriate  Assessment and Plan: 1. Acute bacterial sinusitis -pt with sinus congestion  -advised continue her alka seltzer code -sent in zpak  2. Exposure to Covid-19 Virus -seems symptoms are unrelated to her exposure that took place 3/14 -advised to quarantine thru 3/28 (reports she has anyway)  3. Neuropathy -cont tumeric with pepper and current gabapentin as she's taking them which has been effective for her neuropathic pain  Follow Up Instructions: Advised to self-quarantine for 14 days from her exposure.  Instructions provided via mychart.  Continue alka seltzer cold medicine.  Begin zpak 2 tabs today, then one daily for 4 days.  Monitor for fever or progressing symptoms of cough, chest tightness.  If these develop, call us back for further instruction. No testing at this time due to mild symptoms which are unlikely to be covid-19, more likely sinus infection.     I discussed the assessment and treatment plan with the patient. The patient was provided an opportunity to ask questions and all were answered. The patient agreed with the plan and demonstrated an  understanding of the instructions.   The patient was advised to call back or seek an in-person evaluation if the symptoms worsen or if the condition fails to improve as anticipated.  I provided 13 minutes of non-face-to-face time during this encounter.   Hollace Kinnier, DO Geriatrics [facility] [phone], [place] [address] Cell Phone (Mon-Fri 8am-5pm):  [phone] On Call:  [phone] & follow prompts after 5pm & weekends Office Phone:  [phone] Office Fax:  [phone]

## 2018-07-21 ENCOUNTER — Encounter: Payer: Self-pay | Admitting: Internal Medicine

## 2018-07-21 DIAGNOSIS — B9689 Other specified bacterial agents as the cause of diseases classified elsewhere: Secondary | ICD-10-CM

## 2018-07-21 DIAGNOSIS — J019 Acute sinusitis, unspecified: Principal | ICD-10-CM

## 2018-07-21 MED ORDER — AZITHROMYCIN 250 MG PO TABS: ORAL_TABLET | ORAL | 0 refills | Status: DC

## 2018-07-27 ENCOUNTER — Encounter: Payer: Self-pay | Admitting: Internal Medicine

## 2018-07-29 ENCOUNTER — Other Ambulatory Visit: Payer: Self-pay

## 2018-07-29 ENCOUNTER — Encounter: Payer: Self-pay | Admitting: Nurse Practitioner

## 2018-07-29 ENCOUNTER — Encounter: Payer: Self-pay | Admitting: Internal Medicine

## 2018-07-29 ENCOUNTER — Ambulatory Visit (INDEPENDENT_AMBULATORY_CARE_PROVIDER_SITE_OTHER): Payer: Medicare Other | Admitting: Nurse Practitioner

## 2018-07-29 VITALS — BP 136/76 | Temp 98.1°F

## 2018-07-29 DIAGNOSIS — J069 Acute upper respiratory infection, unspecified: Secondary | ICD-10-CM

## 2018-07-29 NOTE — Patient Instructions (Addendum)
To start probiotic twice daily (florastor) To use MUCINEX 1-2 tablet by mouth twice daily with full glass of water Stay hydrated   To stay isolated until symptoms have resolved for 72 hours.  Call us with any concerns or questions Call 911 for life threatening symptoms.

## 2018-07-29 NOTE — Progress Notes (Signed)
This service is provided via telemedicine  No vital signs collected/recorded due to the encounter was a telemedicine visit.   Location of patient (ex: home, work):  Home   Patient consents to a telephone visit:  Yes  Location of the provider (ex: office, home):  [employer], Office   Names of all persons participating in the telemedicine service and their role in the encounter:  S.Chrae B/CMA, Sherrie Mustache, NP, and Patient   Time spent on call: 5 min with CMA    Virtual Visit via Video Note  I connected with Ernst Bowler on 07/29/18 at  2:15 PM EDT by a video enabled telemedicine application and verified that I am speaking with the correct person using two identifiers.   I discussed the limitations of evaluation and management by telemedicine and the availability of in person appointments. The patient expressed understanding and agreed to proceed.     Careteam: Patient Care Team: Gayland Curry, DO as PCP - General (Geriatric Medicine) Starlyn Skeans as Physician Assistant (Dermatology) Magrinat, Virgie Dad, MD as Consulting Physician (Oncology) Wilford Corner, MD as Consulting Physician (Gastroenterology)  Advanced Directive information    Allergies  Allergen Reactions  . Benadryl [Diphenhydramine Hcl] Other (See Comments)    "Super hyper"  . Tape     Skin breakdown/redness   . Boniva [Ibandronate Sodium] Other (See Comments)    Neck, shoulder limited mobility   . Chlorhexidine Gluconate   . Peridin-C [Ascorbic Acid]   . Venlafaxine   . Codeine Other (See Comments)    "can't wake up from it."  . Sulfa Antibiotics Rash    Patient unsure of reaction    Chief Complaint  Patient presents with  . Acute Visit    Patient no better from last tele-visit with Dr.Reed on 07/20/2018. Patient with lump in chest, productive cough, nausea and waves of feeling clamy. Patients loose bowels returned.      HPI: Patient is a 73 y.o. female due to  not feeling any better.   Reports at the first of the month had a bad head cold that turn into sinusitis, she was placed on zpac, started on 07/21/18. Completed course. She had diarrhea and continued medication.  Temperature baseline 97 then 3rd night on azithromycin went up to elevated 100.6 but after that stayed around 98.  On 3/30 she started pepto which helped Temperature 98.8-98.2 She started to have nausea and feeling clammy- thought to be dehydrated from diarrhea Started drinking Pedialyte, water, gingerale.  At 5 am she was nauseous and clammy  ?allergies, vs flu. Temperature this morning 98.1 Feeling fatigue.  Not short of breath.  Feels like she has mucous/congestion in her chest but not very productive  Coughing clear mucous  Not taking any tylenol or advil.  No body aches.   She is most concerned over the nausea (loss of appetite) and tightness/congestion in chest.   No longer having nasal congestion.   Review of Systems:  Review of Systems  Constitutional: Positive for chills, diaphoresis and malaise/fatigue. Negative for fever.  HENT: Negative for congestion, ear discharge, ear pain and sore throat.   Respiratory: Positive for cough and sputum production. Negative for shortness of breath and wheezing.   Cardiovascular: Negative for chest pain.  Gastrointestinal: Positive for diarrhea and nausea. Negative for abdominal pain and constipation.  Genitourinary: Negative for dysuria, frequency and urgency.  Musculoskeletal: Negative for myalgias.    Past Medical History:  Diagnosis Date  . Aphasia   .  Arthritis    OA- fingers, toes, back pain, hip pain   . Breast cancer of upper-outer quadrant of left female breast ([place])    left breast cancer  . Complication of anesthesia    slow to wake up  . Disorder of bone and cartilage, unspecified   . Diverticulitis   . Hypothyroid   . Lumbago   . Malignant neoplasm of breast (female), unspecified site   . Normal nuclear  stress test    [facility], 07/18/2011  . Osteoarthritis (arthritis due to wear and tear of joints)   . Pain in joint, pelvic region and thigh   . Reflux esophagitis    pt. knowledgeable about dietary intake relative to reflux  . S/P bilateral breast biopsy 2013  . Shortness of breath    /w housework  . Symptomatic menopausal or female climacteric states    Past Surgical History:  Procedure Laterality Date  . BREAST BIOPSY  07/26/2011   Procedure: BREAST BIOPSY WITH NEEDLE LOCALIZATION;  Surgeon: Rolm Bookbinder, MD;  Location: [facility];  Service: General;  Laterality: Right;  Right breast wire guided biospy Needle localization @ [facility]  7:30  . BREAST SURGERY     Procedure: BREAST BIOPSY WITH NEEDLE LOCALIZATION;  [place]  . CATARACT EXTRACTION  2012  . DILATION AND CURETTAGE OF UTERUS  1978  . ECTOPIC PREGNANCY SURGERY     1980  . EYE SURGERY     R cat. extracted /w IOL  . OVARIAN CYST SURGERY    . TONSILLECTOMY     as a child   Social History:   reports that she quit smoking about 38 years ago. She smoked 0.25 packs per day. She has never used smokeless tobacco. She reports that she does not drink alcohol or use drugs.  Family History  Problem Relation Age of Onset  . Cancer Sister        colon  . Cancer Brother        renal  . Heart disease Brother   . Cancer Mother        leukemia  . Peripheral vascular disease Mother   . Cancer Father        lung  . Heart attack Brother   . Cancer Maternal Aunt        breast  . Cancer Cousin   . Cancer Cousin   . Anesthesia problems Neg Hx     Medications: Patient's Medications  New Prescriptions   No medications on file  Previous Medications   CALCIUM CARBONATE-VITAMIN D PO    Take 400 mg by mouth.   CHOLECALCIFEROL (VITAMIN D) 1000 UNITS TABLET    Take 1,000 Units by mouth 2 (two) times daily.   COD LIVER OIL/VITAMINS A & D PO    Take 415 mg by mouth daily.   DICLOFENAC SODIUM (VOLTAREN) 1 % GEL    Apply 2 g topically daily.    GABAPENTIN (NEURONTIN) 100 MG CAPSULE    1 capsule in am and 2 capsules at bedtime   IBUPROFEN (ADVIL,MOTRIN) 100 MG TABLET    Take 400 mg by mouth every 6 (six) hours as needed. For pain   LORAZEPAM (ATIVAN) 0.5 MG TABLET    Take 1 tablet (0.5 mg total) by mouth daily as needed for anxiety.   NONFORMULARY OR COMPOUNDED ITEM    1 tablet. Move free for joint health   NONFORMULARY OR COMPOUNDED ITEM    1 tablet. Tumeric   SYNTHROID 88 MCG TABLET  TAKE 1 TABLET (88 MCG TOTAL) BY MOUTH DAILY BEFORE BREAKFAST.   VITAMIN E 400 UNIT CAPSULE    Take 400 Units by mouth 2 (two) times daily.  Modified Medications   No medications on file  Discontinued Medications   AZITHROMYCIN (ZITHROMAX) 250 MG TABLET    2 tabs today, then 1 daily for 4 days     Physical Exam:  Vitals:   07/29/18 1140  BP: 136/76  Temp: 98.1 F (36.7 C)  TempSrc: Oral    Labs reviewed: Basic Metabolic Panel: Recent Labs    10/09/17 0903  NA 141  K 4.4  CL 107  CO2 29  GLUCOSE 88  BUN 12  CREATININE 0.76  CALCIUM 8.9  TSH 0.86   Liver Function Tests: Recent Labs    10/09/17 0903  AST 12  ALT 7  BILITOT 0.5  PROT 6.7   No results for input(s): LIPASE, AMYLASE in the last 8760 hours. No results for input(s): AMMONIA in the last 8760 hours. CBC: Recent Labs    10/09/17 0903  WBC 3.7*  NEUTROABS 1,676  HGB 13.6  HCT 39.3  MCV 89.9  PLT 260   Lipid Panel: Recent Labs    10/09/17 0903  CHOL 193  HDL 52  LDLCALC 119*  TRIG 112  CHOLHDL 3.7   TSH: Recent Labs    10/09/17 0903  TSH 0.86   A1C: No results found for: HGBA1C   Assessment/Plan 1. Viral URI Pt was at an event with positive COVID-19 patient. Discussed for her to continue supportive care at this time. She completed z-pac and nasal congestion has improved.  To use florastor twice daily for gut health. To keep hydrated, to use drink with electrolytes, bland diet advance as tolerates -to use mucinex by mouth twice daily  with full glass of water for congestion.   -To stay isolated until symptoms have resolved for 72 hours.  Call us with any concerns or questions Call 911 for life threatening symptoms.   Carlos American. Harle Battiest  [facility] [phone]    Follow Up Instructions:    I discussed the assessment and treatment plan with the patient. The patient was provided an opportunity to ask questions and all were answered. The patient agreed with the plan and demonstrated an understanding of the instructions.   The patient was advised to call back or seek an in-person evaluation if the symptoms worsen or if the condition fails to improve as anticipated.  I provided 18  minutes of non-face-to-face time during this encounter.   Lauree Chandler, NP

## 2018-10-20 ENCOUNTER — Other Ambulatory Visit: Payer: Medicare Other

## 2018-10-26 ENCOUNTER — Other Ambulatory Visit: Payer: Self-pay | Admitting: Internal Medicine

## 2018-10-26 ENCOUNTER — Ambulatory Visit: Payer: Medicare Other | Admitting: Internal Medicine

## 2018-10-26 DIAGNOSIS — G629 Polyneuropathy, unspecified: Secondary | ICD-10-CM

## 2018-11-03 ENCOUNTER — Other Ambulatory Visit: Payer: Self-pay | Admitting: Internal Medicine

## 2018-11-03 ENCOUNTER — Encounter: Payer: Self-pay | Admitting: Internal Medicine

## 2018-11-03 DIAGNOSIS — G629 Polyneuropathy, unspecified: Secondary | ICD-10-CM

## 2018-11-03 DIAGNOSIS — E7800 Pure hypercholesterolemia, unspecified: Secondary | ICD-10-CM

## 2018-11-03 DIAGNOSIS — E039 Hypothyroidism, unspecified: Secondary | ICD-10-CM

## 2018-11-04 ENCOUNTER — Other Ambulatory Visit

## 2018-11-04 ENCOUNTER — Telehealth: Payer: Self-pay | Admitting: *Deleted

## 2018-11-04 ENCOUNTER — Encounter: Payer: Self-pay | Admitting: Internal Medicine

## 2018-11-04 ENCOUNTER — Other Ambulatory Visit: Payer: Medicare Other

## 2018-11-04 DIAGNOSIS — R6889 Other general symptoms and signs: Secondary | ICD-10-CM | POA: Diagnosis not present

## 2018-11-04 DIAGNOSIS — Z20822 Contact with and (suspected) exposure to covid-19: Secondary | ICD-10-CM

## 2018-11-04 NOTE — Telephone Encounter (Signed)
Message routed to Reed, Tiffany L, DO  

## 2018-11-04 NOTE — Telephone Encounter (Signed)
-----   Message from Gayland Curry, DO sent at 11/03/2018  4:00 PM EDT ----- Regarding: covid-19 test Hello,  Sylvia [name] is experiencing loose stools and diarrhea.  She's meant to come into our geriatric office for labs and an appointment, but we want to be sure she is not covid positive.  Can you please contact her to set up a test at [facility]?   Thanks, Tiffany L. Reed, D.O. [facility] [address], [place] [address] Cell Phone (Mon-Fri 8am-5pm):  [phone] On Call:  [phone] & follow prompts after 5pm & weekends Office Phone:  [phone] Office Fax:  [phone]

## 2018-11-04 NOTE — Telephone Encounter (Signed)
Pt scheduled today @ 1:15 @ [facility]. Instructions given and order placed

## 2018-11-05 ENCOUNTER — Encounter: Payer: Self-pay | Admitting: Internal Medicine

## 2018-11-07 ENCOUNTER — Encounter: Payer: Self-pay | Admitting: Internal Medicine

## 2018-11-09 ENCOUNTER — Ambulatory Visit: Payer: Medicare Other | Admitting: Internal Medicine

## 2018-11-09 LAB — NOVEL CORONAVIRUS, NAA: SARS-CoV-2, NAA: NOT DETECTED

## 2018-11-09 NOTE — Telephone Encounter (Signed)
Message routed to Reed, Tiffany L, DO  

## 2018-11-10 ENCOUNTER — Encounter: Payer: Self-pay | Admitting: Internal Medicine

## 2018-11-11 ENCOUNTER — Other Ambulatory Visit: Payer: Self-pay

## 2018-11-11 ENCOUNTER — Other Ambulatory Visit: Payer: Medicare Other

## 2018-11-11 DIAGNOSIS — E039 Hypothyroidism, unspecified: Secondary | ICD-10-CM | POA: Diagnosis not present

## 2018-11-11 DIAGNOSIS — G629 Polyneuropathy, unspecified: Secondary | ICD-10-CM | POA: Diagnosis not present

## 2018-11-11 DIAGNOSIS — E7800 Pure hypercholesterolemia, unspecified: Secondary | ICD-10-CM | POA: Diagnosis not present

## 2018-11-12 LAB — COMPLETE METABOLIC PANEL WITH GFR
AG Ratio: 1.6 (calc) (ref 1.0–2.5)
ALT: 8 U/L (ref 6–29)
AST: 13 U/L (ref 10–35)
Albumin: 4.4 g/dL (ref 3.6–5.1)
Alkaline phosphatase (APISO): 62 U/L (ref 37–153)
BUN: 10 mg/dL (ref 7–25)
CO2: 29 mmol/L (ref 20–32)
Calcium: 9.2 mg/dL (ref 8.6–10.4)
Chloride: 105 mmol/L (ref 98–110)
Creat: 0.85 mg/dL (ref 0.60–0.93)
GFR, Est African American: 78 mL/min/{1.73_m2} (ref >=60)
GFR, Est Non African American: 67 mL/min/{1.73_m2} (ref >=60)
Globulin: 2.7 g/dL (calc) (ref 1.9–3.7)
Glucose, Bld: 90 mg/dL (ref 65–99)
Potassium: 5.1 mmol/L (ref 3.5–5.3)
Sodium: 140 mmol/L (ref 135–146)
Total Bilirubin: 0.6 mg/dL (ref 0.2–1.2)
Total Protein: 7.1 g/dL (ref 6.1–8.1)

## 2018-11-12 LAB — CBC WITH DIFFERENTIAL/PLATELET
Absolute Monocytes: 241 cells/uL (ref 200–950)
Basophils Absolute: 41 cells/uL (ref 0–200)
Basophils Relative: 1.1 %
Eosinophils Absolute: 48 cells/uL (ref 15–500)
Eosinophils Relative: 1.3 %
HCT: 43.5 % (ref 35.0–45.0)
Hemoglobin: 14.3 g/dL (ref 11.7–15.5)
Lymphs Abs: 1402 cells/uL (ref 850–3900)
MCH: 30.5 pg (ref 27.0–33.0)
MCHC: 32.9 g/dL (ref 32.0–36.0)
MCV: 92.8 fL (ref 80.0–100.0)
MPV: 9.7 fL (ref 7.5–12.5)
Monocytes Relative: 6.5 %
Neutro Abs: 1968 cells/uL (ref 1500–7800)
Neutrophils Relative %: 53.2 %
Platelets: 223 10*3/uL (ref 140–400)
RBC: 4.69 10*6/uL (ref 3.80–5.10)
RDW: 12.9 % (ref 11.0–15.0)
Total Lymphocyte: 37.9 %
WBC: 3.7 10*3/uL — ABNORMAL LOW (ref 3.8–10.8)

## 2018-11-12 LAB — LIPID PANEL
Cholesterol: 225 mg/dL — ABNORMAL HIGH (ref <200)
HDL: 64 mg/dL (ref >=50)
LDL Cholesterol (Calc): 133 mg/dL (calc) — ABNORMAL HIGH
Non-HDL Cholesterol (Calc): 161 mg/dL (calc) — ABNORMAL HIGH (ref <130)
Total CHOL/HDL Ratio: 3.5 (calc) (ref <5.0)
Triglycerides: 152 mg/dL — ABNORMAL HIGH (ref <150)

## 2018-11-12 LAB — TSH: TSH: 0.37 mIU/L — ABNORMAL LOW (ref 0.40–4.50)

## 2018-11-16 ENCOUNTER — Encounter: Payer: Self-pay | Admitting: Internal Medicine

## 2018-11-16 ENCOUNTER — Ambulatory Visit (INDEPENDENT_AMBULATORY_CARE_PROVIDER_SITE_OTHER): Payer: Medicare Other | Admitting: Internal Medicine

## 2018-11-16 ENCOUNTER — Other Ambulatory Visit: Payer: Self-pay

## 2018-11-16 VITALS — BP 122/70 | HR 74 | Temp 98.2°F | Ht 60.0 in | Wt 151.0 lb

## 2018-11-16 DIAGNOSIS — Z170 Estrogen receptor positive status [ER+]: Secondary | ICD-10-CM | POA: Diagnosis not present

## 2018-11-16 DIAGNOSIS — M8949 Other hypertrophic osteoarthropathy, multiple sites: Secondary | ICD-10-CM

## 2018-11-16 DIAGNOSIS — E039 Hypothyroidism, unspecified: Secondary | ICD-10-CM | POA: Diagnosis not present

## 2018-11-16 DIAGNOSIS — M150 Primary generalized (osteo)arthritis: Secondary | ICD-10-CM

## 2018-11-16 DIAGNOSIS — E7800 Pure hypercholesterolemia, unspecified: Secondary | ICD-10-CM | POA: Diagnosis not present

## 2018-11-16 DIAGNOSIS — M8589 Other specified disorders of bone density and structure, multiple sites: Secondary | ICD-10-CM | POA: Diagnosis not present

## 2018-11-16 DIAGNOSIS — G629 Polyneuropathy, unspecified: Secondary | ICD-10-CM

## 2018-11-16 DIAGNOSIS — C50412 Malignant neoplasm of upper-outer quadrant of left female breast: Secondary | ICD-10-CM

## 2018-11-16 DIAGNOSIS — M159 Polyosteoarthritis, unspecified: Secondary | ICD-10-CM

## 2018-11-16 MED ORDER — EZETIMIBE 10 MG PO TABS: 10.0000 mg | ORAL_TABLET | Freq: Every day | ORAL | 3 refills | Status: DC

## 2018-11-16 NOTE — Progress Notes (Signed)
Location:  [facility] clinic Provider:  Malakye Nolden L. Mariea Clonts, D.O., C.M.D.  Code Status: DNR Goals of Care:  Advanced Directives 04/14/2018  Does Patient Have a Medical Advance Directive? Yes  Type of Advance Directive [place]  Does patient want to make changes to medical advance directive? No - Patient declined  Copy of [place] in Chart? Yes - validated most recent copy scanned in chart (See row information)  Would patient like information on creating a medical advance directive? -     Chief Complaint  Patient presents with  . Medical Management of Chronic Issues    49mth follow-up    HPI: Patient is a 73 y.o. female seen today for medical management of chronic diseases.    Had negative covid-19 test.  Bowel problem:  Last bout of diarrhea was 7/10.  She had taken a couple of imodium ADs on 7/7.  Had not gone b/w 7/7 to 7/10. 7/11, soft but formed.  Never a lot.  The past few days she goes when she first gets up.  May go a little bit later in the day, but doesn't feel like she fully evacuates.    7/5 was when it started. She stopped all meds except synthroid and gabapentin.  She brought all of her meds and wanted to see if any were responsible.    Both her calcium and her osteobiflex have magnesium in them but without she gets bound up.  Turmeric-curcumin helped her neuropathy pain.    Reviewed that turmeric-curcumin only at very high doses can cause loose stools.    She admits she does not drink much water at all.  Educated on hydration and effects on constipation.  Has lost sense of urgency when she has to use the restroom.  She will try miralax.  Has not been able to get out to exercise.  Feet hurt her more if she walks so not walking, but then cholesterol has gone up.  Diet has not changed much.    Past Medical History:  Diagnosis Date  . Aphasia   . Arthritis    OA- fingers, toes, back pain, hip pain   . Breast cancer of upper-outer  quadrant of left female breast ([place])    left breast cancer  . Complication of anesthesia    slow to wake up  . Disorder of bone and cartilage, unspecified   . Diverticulitis   . Hypothyroid   . Lumbago   . Malignant neoplasm of breast (female), unspecified site   . Normal nuclear stress test    [facility], 07/18/2011  . Osteoarthritis (arthritis due to wear and tear of joints)   . Pain in joint, pelvic region and thigh   . Reflux esophagitis    pt. knowledgeable about dietary intake relative to reflux  . S/P bilateral breast biopsy 2013  . Shortness of breath    /w housework  . Symptomatic menopausal or female climacteric states     Past Surgical History:  Procedure Laterality Date  . BREAST BIOPSY  07/26/2011   Procedure: BREAST BIOPSY WITH NEEDLE LOCALIZATION;  Surgeon: Rolm Bookbinder, MD;  Location: [facility];  Service: General;  Laterality: Right;  Right breast wire guided biospy Needle localization @ [facility]  7:30  . BREAST SURGERY     Procedure: BREAST BIOPSY WITH NEEDLE LOCALIZATION;  [place]  . CATARACT EXTRACTION  2012  . DILATION AND CURETTAGE OF UTERUS  1978  . ECTOPIC PREGNANCY SURGERY  1980  . EYE SURGERY     R cat. extracted /w IOL  . OVARIAN CYST SURGERY    . TONSILLECTOMY     as a child    Allergies  Allergen Reactions  . Benadryl [Diphenhydramine Hcl] Other (See Comments)    "Super hyper"  . Tape     Skin breakdown/redness   . Boniva [Ibandronate Sodium] Other (See Comments)    Neck, shoulder limited mobility   . Chlorhexidine Gluconate   . Peridin-C [Ascorbic Acid]   . Venlafaxine   . Codeine Other (See Comments)    "can't wake up from it."  . Sulfa Antibiotics Rash    Patient unsure of reaction    Outpatient Encounter Medications as of 11/16/2018  Medication Sig  . CALCIUM CARBONATE-VITAMIN D PO Take 400 mg by mouth.  . cholecalciferol (VITAMIN D) 1000 UNITS tablet Take 1,000 Units by mouth 2 (two) times daily.  . diclofenac sodium (VOLTAREN) 1  % GEL Apply 2 g topically daily.  Marland Kitchen gabapentin (NEURONTIN) 100 MG capsule Take 200 mg by mouth at bedtime.  Marland Kitchen ibuprofen (ADVIL,MOTRIN) 100 MG tablet Take 400 mg by mouth every 6 (six) hours as needed. For pain  . NONFORMULARY OR COMPOUNDED ITEM 1 tablet. Move free for joint health  . NONFORMULARY OR COMPOUNDED ITEM 1 tablet. Tumeric  . SYNTHROID 88 MCG tablet TAKE 1 TABLET (88 MCG TOTAL) BY MOUTH DAILY BEFORE BREAKFAST.  Marland Kitchen vitamin E 400 UNIT capsule Take 400 Units by mouth 2 (two) times daily.  . [DISCONTINUED] COD LIVER OIL/VITAMINS A & D PO Take 415 mg by mouth daily.  . [DISCONTINUED] gabapentin (NEURONTIN) 100 MG capsule TAKE 1 CAPSULE BY MOUTH IN THE MORNING AND 2 CAPSULES AT BEDTIME  . [DISCONTINUED] LORazepam (ATIVAN) 0.5 MG tablet Take 1 tablet (0.5 mg total) by mouth daily as needed for anxiety. (Patient not taking: Reported on 07/29/2018)   No facility-administered encounter medications on file as of 11/16/2018.     Review of Systems:  Review of Systems  Constitutional: Negative for chills, fever and malaise/fatigue.  HENT: Negative for congestion and hearing loss.   Eyes: Negative for blurred vision.  Respiratory: Negative for cough and shortness of breath.   Cardiovascular: Negative for chest pain, palpitations and leg swelling.  Gastrointestinal: Negative for abdominal pain.       Loose bms lately, constipation with calcium  Genitourinary: Positive for urgency. Negative for dysuria and flank pain.  Musculoskeletal: Positive for joint pain. Negative for falls.       Foot pain is the worst pain she's got right now  Skin: Negative for itching and rash.  Neurological: Positive for tingling and sensory change. Negative for loss of consciousness.  Endo/Heme/Allergies: Bruises/bleeds easily.  Psychiatric/Behavioral: Negative for depression and memory loss. The patient is not nervous/anxious and does not have insomnia.     Health Maintenance  Topic Date Due  . INFLUENZA VACCINE   11/28/2018  . MAMMOGRAM  02/14/2020  . COLONOSCOPY  12/31/2022  . TETANUS/TDAP  04/16/2023  . DEXA SCAN  Completed  . Hepatitis C Screening  Completed  . PNA vac Low Risk Adult  Completed    Physical Exam: Vitals:   11/16/18 1138  BP: 122/70  Pulse: 74  Temp: 98.2 F (36.8 C)  TempSrc: Oral  SpO2: 98%  Weight: 151 lb (68.5 kg)  Height: 5' (1.524 m)   Body mass index is 29.49 kg/m. Physical Exam Vitals signs reviewed.  Constitutional:      General:  She is not in acute distress.    Appearance: Normal appearance. She is normal weight. She is not toxic-appearing.  HENT:     Head: Normocephalic and atraumatic.  Neck:     Musculoskeletal: Neck supple. No muscular tenderness.  Cardiovascular:     Rate and Rhythm: Normal rate and regular rhythm.     Pulses: Normal pulses.     Heart sounds: Normal heart sounds.  Pulmonary:     Effort: Pulmonary effort is normal.     Breath sounds: Normal breath sounds. No wheezing, rhonchi or rales.  Abdominal:     General: Bowel sounds are normal.  Lymphadenopathy:     Cervical: No cervical adenopathy.  Neurological:     Mental Status: She is alert.     Labs reviewed: Basic Metabolic Panel: Recent Labs    11/11/18 0854  NA 140  K 5.1  CL 105  CO2 29  GLUCOSE 90  BUN 10  CREATININE 0.85  CALCIUM 9.2  TSH 0.37*   Liver Function Tests: Recent Labs    11/11/18 0854  AST 13  ALT 8  BILITOT 0.6  PROT 7.1   No results for input(s): LIPASE, AMYLASE in the last 8760 hours. No results for input(s): AMMONIA in the last 8760 hours. CBC: Recent Labs    11/11/18 0854  WBC 3.7*  NEUTROABS 1,968  HGB 14.3  HCT 43.5  MCV 92.8  PLT 223   Lipid Panel: Recent Labs    11/11/18 0854  CHOL 225*  HDL 64  LDLCALC 133*  TRIG 152*  CHOLHDL 3.5   Assessment/Plan 1. Pure hypercholesterolemia -limited exercise especially now with covid and the extreme heat -she's willing to try zetia therapy for her cholesterol so will  start it and recheck fasting lipids in 6 wks - ezetimibe (ZETIA) 10 MG tablet; Take 1 tablet (10 mg total) by mouth daily.  Dispense: 90 tablet; Refill: 3 - Lipid panel; Future  2. Acquired hypothyroidism -cont levothyroxine at current dose, but TSH borderline so recheck in 6 wks - TSH; Future  3. Neuropathy -longstanding issue since breast cancer treatments, I'd prescribed capsaicin and she's now using turmeric-curcumin which supposedly only causes diarrhea at high doses  4. Primary osteoarthritis involving multiple joints -recommend voltaren gel over oral ibuprofen, use tylenol for pain  5. Osteopenia of multiple sites -continue calcium with D (well restart the calcium she was taking that also has magnesium but monitor stools)  6. Malignant neoplasm of upper-outer quadrant of left breast in female, estrogen receptor positive ([place]) -was on short-term hormone therapy, but did not tolerate well and still deals with menopausal symptoms  Labs/tests ordered:  * No order type specified * Next appt:  04/16/2019   Renel Ende L. Eriel Doyon, D.O. [facility] [address], [place] [address] Cell Phone (Mon-Fri 8am-5pm):  [phone] On Call:  [phone] & follow prompts after 5pm & weekends Office Phone:  [phone] Office Fax:  [phone]

## 2018-11-16 NOTE — Patient Instructions (Signed)
Try taking pepcid with your evening meal to help decrease inflammation in your esophagus.    We will recheck your thyroid in 6 weeks.  TSH was borderline.  Start zetia (cholesterol medication).  We can recheck your cholesterol in 6 weeks also.  Stop using the osteobiflex and resume your calcium with magnesium supplement.  Let me know if the loose stools resume.  You may use miralax if you get constipated.  Try also to do better about drinking water--at least 4 8oz glasses per day.

## 2018-12-18 ENCOUNTER — Other Ambulatory Visit: Payer: Self-pay | Admitting: Internal Medicine

## 2018-12-28 ENCOUNTER — Other Ambulatory Visit: Payer: Self-pay

## 2018-12-28 ENCOUNTER — Other Ambulatory Visit: Payer: Medicare Other

## 2018-12-28 DIAGNOSIS — E039 Hypothyroidism, unspecified: Secondary | ICD-10-CM

## 2018-12-28 DIAGNOSIS — E7800 Pure hypercholesterolemia, unspecified: Secondary | ICD-10-CM

## 2018-12-28 LAB — TSH: TSH: 0.28 mIU/L — ABNORMAL LOW (ref 0.40–4.50)

## 2018-12-28 LAB — LIPID PANEL
Cholesterol: 202 mg/dL — ABNORMAL HIGH (ref <200)
HDL: 58 mg/dL (ref >=50)
LDL Cholesterol (Calc): 118 mg/dL (calc) — ABNORMAL HIGH
Non-HDL Cholesterol (Calc): 144 mg/dL (calc) — ABNORMAL HIGH (ref <130)
Total CHOL/HDL Ratio: 3.5 (calc) (ref <5.0)
Triglycerides: 149 mg/dL (ref <150)

## 2018-12-29 ENCOUNTER — Telehealth: Payer: Self-pay | Admitting: *Deleted

## 2018-12-29 MED ORDER — LEVOTHYROXINE SODIUM 75 MCG PO TABS: 75.0000 ug | ORAL_TABLET | Freq: Every day | ORAL | 0 refills | Status: DC

## 2018-12-29 NOTE — Telephone Encounter (Signed)
-----   Message from Gayland Curry, DO sent at 12/28/2018  4:44 PM EDT ----- TSH is still low, in fact lower, so I do think we need to reduce her levothyroxine dose to 24mcg daily and recheck at her next visit in November.  Please send the new dose of levothyroxine to her pharmacy.  Bad cholesterol has improved but is still above goal.  Hopefully, ongoing zetia along with improved exercise will lower this as the weather cools down.

## 2019-01-20 ENCOUNTER — Other Ambulatory Visit: Payer: Self-pay | Admitting: Internal Medicine

## 2019-01-20 DIAGNOSIS — G629 Polyneuropathy, unspecified: Secondary | ICD-10-CM

## 2019-01-20 MED ORDER — GABAPENTIN 100 MG PO CAPS: 200.0000 mg | ORAL_CAPSULE | Freq: Every day | ORAL | 1 refills | Status: DC

## 2019-02-02 ENCOUNTER — Other Ambulatory Visit: Payer: Self-pay

## 2019-02-02 ENCOUNTER — Ambulatory Visit (INDEPENDENT_AMBULATORY_CARE_PROVIDER_SITE_OTHER): Payer: Medicare Other

## 2019-02-02 DIAGNOSIS — Z23 Encounter for immunization: Secondary | ICD-10-CM

## 2019-03-02 ENCOUNTER — Other Ambulatory Visit: Payer: Self-pay

## 2019-03-02 ENCOUNTER — Encounter (HOSPITAL_COMMUNITY): Payer: Self-pay | Admitting: Emergency Medicine

## 2019-03-02 DIAGNOSIS — Z79890 Hormone replacement therapy (postmenopausal): Secondary | ICD-10-CM | POA: Insufficient documentation

## 2019-03-02 DIAGNOSIS — Z20828 Contact with and (suspected) exposure to other viral communicable diseases: Secondary | ICD-10-CM | POA: Insufficient documentation

## 2019-03-02 DIAGNOSIS — Z853 Personal history of malignant neoplasm of breast: Secondary | ICD-10-CM | POA: Diagnosis not present

## 2019-03-02 DIAGNOSIS — M19071 Primary osteoarthritis, right ankle and foot: Secondary | ICD-10-CM | POA: Diagnosis not present

## 2019-03-02 DIAGNOSIS — Z882 Allergy status to sulfonamides status: Secondary | ICD-10-CM | POA: Insufficient documentation

## 2019-03-02 DIAGNOSIS — Z888 Allergy status to other drugs, medicaments and biological substances status: Secondary | ICD-10-CM | POA: Diagnosis not present

## 2019-03-02 DIAGNOSIS — Z87891 Personal history of nicotine dependence: Secondary | ICD-10-CM | POA: Diagnosis not present

## 2019-03-02 DIAGNOSIS — I700 Atherosclerosis of aorta: Secondary | ICD-10-CM | POA: Insufficient documentation

## 2019-03-02 DIAGNOSIS — E039 Hypothyroidism, unspecified: Secondary | ICD-10-CM | POA: Diagnosis not present

## 2019-03-02 DIAGNOSIS — K3580 Unspecified acute appendicitis: Secondary | ICD-10-CM | POA: Diagnosis not present

## 2019-03-02 DIAGNOSIS — R109 Unspecified abdominal pain: Secondary | ICD-10-CM | POA: Diagnosis not present

## 2019-03-02 DIAGNOSIS — Z885 Allergy status to narcotic agent status: Secondary | ICD-10-CM | POA: Insufficient documentation

## 2019-03-02 DIAGNOSIS — Z79899 Other long term (current) drug therapy: Secondary | ICD-10-CM | POA: Insufficient documentation

## 2019-03-02 DIAGNOSIS — M19049 Primary osteoarthritis, unspecified hand: Secondary | ICD-10-CM | POA: Insufficient documentation

## 2019-03-02 DIAGNOSIS — M19072 Primary osteoarthritis, left ankle and foot: Secondary | ICD-10-CM | POA: Diagnosis not present

## 2019-03-02 DIAGNOSIS — K353 Acute appendicitis with localized peritonitis, without perforation or gangrene: Secondary | ICD-10-CM | POA: Diagnosis present

## 2019-03-02 MED ORDER — SODIUM CHLORIDE 0.9% FLUSH
3.0000 mL | Freq: Once | INTRAVENOUS | Status: AC
  Administered 2019-03-03: 02:00:00 3 mL via INTRAVENOUS

## 2019-03-02 NOTE — ED Triage Notes (Signed)
Pt reports RLQ pain with bloating along with nausea. Pt denies any vomiting.

## 2019-03-03 ENCOUNTER — Encounter (HOSPITAL_COMMUNITY): Payer: Self-pay

## 2019-03-03 ENCOUNTER — Encounter (HOSPITAL_COMMUNITY)
Admission: EM | Disposition: A | Discharge status: Home / Self Care | Payer: Self-pay | Source: Home / Self Care | Attending: Emergency Medicine

## 2019-03-03 ENCOUNTER — Observation Stay (HOSPITAL_COMMUNITY): Payer: Medicare Other | Admitting: Anesthesiology

## 2019-03-03 ENCOUNTER — Emergency Department (HOSPITAL_COMMUNITY): Payer: Medicare Other

## 2019-03-03 ENCOUNTER — Observation Stay (HOSPITAL_COMMUNITY)
Admission: EM | Admit: 2019-03-03 | Discharge: 2019-03-04 | Disposition: A | Discharge status: Home / Self Care | Payer: Medicare Other | Attending: Physician Assistant | Admitting: Physician Assistant

## 2019-03-03 DIAGNOSIS — K3580 Unspecified acute appendicitis: Secondary | ICD-10-CM | POA: Diagnosis present

## 2019-03-03 DIAGNOSIS — E039 Hypothyroidism, unspecified: Secondary | ICD-10-CM | POA: Diagnosis not present

## 2019-03-03 DIAGNOSIS — E7800 Pure hypercholesterolemia, unspecified: Secondary | ICD-10-CM | POA: Diagnosis not present

## 2019-03-03 DIAGNOSIS — Z79890 Hormone replacement therapy (postmenopausal): Secondary | ICD-10-CM | POA: Diagnosis not present

## 2019-03-03 DIAGNOSIS — R109 Unspecified abdominal pain: Secondary | ICD-10-CM | POA: Diagnosis not present

## 2019-03-03 DIAGNOSIS — Z853 Personal history of malignant neoplasm of breast: Secondary | ICD-10-CM | POA: Diagnosis not present

## 2019-03-03 DIAGNOSIS — M19072 Primary osteoarthritis, left ankle and foot: Secondary | ICD-10-CM | POA: Diagnosis not present

## 2019-03-03 DIAGNOSIS — I700 Atherosclerosis of aorta: Secondary | ICD-10-CM | POA: Diagnosis not present

## 2019-03-03 DIAGNOSIS — F419 Anxiety disorder, unspecified: Secondary | ICD-10-CM | POA: Diagnosis not present

## 2019-03-03 DIAGNOSIS — Z20828 Contact with and (suspected) exposure to other viral communicable diseases: Secondary | ICD-10-CM | POA: Diagnosis not present

## 2019-03-03 DIAGNOSIS — Z79899 Other long term (current) drug therapy: Secondary | ICD-10-CM | POA: Diagnosis not present

## 2019-03-03 DIAGNOSIS — K353 Acute appendicitis with localized peritonitis, without perforation or gangrene: Secondary | ICD-10-CM

## 2019-03-03 DIAGNOSIS — M19071 Primary osteoarthritis, right ankle and foot: Secondary | ICD-10-CM | POA: Diagnosis not present

## 2019-03-03 DIAGNOSIS — Z87891 Personal history of nicotine dependence: Secondary | ICD-10-CM | POA: Diagnosis not present

## 2019-03-03 DIAGNOSIS — Z885 Allergy status to narcotic agent status: Secondary | ICD-10-CM | POA: Diagnosis not present

## 2019-03-03 DIAGNOSIS — M19049 Primary osteoarthritis, unspecified hand: Secondary | ICD-10-CM | POA: Diagnosis not present

## 2019-03-03 HISTORY — PX: LAPAROSCOPIC APPENDECTOMY: SHX408

## 2019-03-03 LAB — CBC
HCT: 42 % (ref 36.0–46.0)
Hemoglobin: 14 g/dL (ref 12.0–15.0)
MCH: 31.3 pg (ref 26.0–34.0)
MCHC: 33.3 g/dL (ref 30.0–36.0)
MCV: 93.8 fL (ref 80.0–100.0)
Platelets: 201 10*3/uL (ref 150–400)
RBC: 4.48 MIL/uL (ref 3.87–5.11)
RDW: 13.2 % (ref 11.5–15.5)
WBC: 11.1 10*3/uL — ABNORMAL HIGH (ref 4.0–10.5)
nRBC: 0 % (ref 0.0–0.2)

## 2019-03-03 LAB — URINALYSIS, ROUTINE W REFLEX MICROSCOPIC
Bilirubin Urine: NEGATIVE
Glucose, UA: NEGATIVE mg/dL
Hgb urine dipstick: NEGATIVE
Ketones, ur: 80 mg/dL — AB
Leukocytes,Ua: NEGATIVE
Nitrite: NEGATIVE
Protein, ur: NEGATIVE mg/dL
Specific Gravity, Urine: 1.019 (ref 1.005–1.030)
pH: 7 (ref 5.0–8.0)

## 2019-03-03 LAB — COMPREHENSIVE METABOLIC PANEL
ALT: 13 U/L (ref 0–44)
AST: 19 U/L (ref 15–41)
Albumin: 4.3 g/dL (ref 3.5–5.0)
Alkaline Phosphatase: 57 U/L (ref 38–126)
Anion gap: 10 (ref 5–15)
BUN: 9 mg/dL (ref 8–23)
CO2: 22 mmol/L (ref 22–32)
Calcium: 9.1 mg/dL (ref 8.9–10.3)
Chloride: 106 mmol/L (ref 98–111)
Creatinine, Ser: 0.74 mg/dL (ref 0.44–1.00)
GFR calc Af Amer: >60 mL/min (ref >60)
GFR calc non Af Amer: >60 mL/min (ref >60)
Glucose, Bld: 139 mg/dL — ABNORMAL HIGH (ref 70–99)
Potassium: 3.9 mmol/L (ref 3.5–5.1)
Sodium: 138 mmol/L (ref 135–145)
Total Bilirubin: 1 mg/dL (ref 0.3–1.2)
Total Protein: 7.5 g/dL (ref 6.5–8.1)

## 2019-03-03 LAB — MRSA PCR SCREENING: MRSA by PCR: NEGATIVE

## 2019-03-03 LAB — LIPASE, BLOOD: Lipase: 29 U/L (ref 11–51)

## 2019-03-03 LAB — SARS CORONAVIRUS 2 BY RT PCR (HOSPITAL ORDER, PERFORMED IN ~~LOC~~ HOSPITAL LAB): SARS Coronavirus 2: NEGATIVE

## 2019-03-03 SURGERY — APPENDECTOMY, LAPAROSCOPIC
Anesthesia: General

## 2019-03-03 MED ORDER — PROPOFOL 10 MG/ML IV BOLUS
INTRAVENOUS | Status: DC | PRN
  Administered 2019-03-03: 50 mg via INTRAVENOUS
  Administered 2019-03-03: 100 mg via INTRAVENOUS

## 2019-03-03 MED ORDER — SODIUM CHLORIDE (PF) 0.9 % IJ SOLN
INTRAMUSCULAR | Status: AC
  Filled 2019-03-03: qty 50

## 2019-03-03 MED ORDER — SIMETHICONE 80 MG PO CHEW
40.0000 mg | CHEWABLE_TABLET | Freq: Four times a day (QID) | ORAL | Status: DC | PRN
  Filled 2019-03-03: qty 1

## 2019-03-03 MED ORDER — PIPERACILLIN-TAZOBACTAM 3.375 G IVPB
3.3750 g | Freq: Three times a day (TID) | INTRAVENOUS | Status: DC
  Administered 2019-03-03 – 2019-03-04 (×3): 3.375 g via INTRAVENOUS
  Filled 2019-03-03 (×3): qty 50

## 2019-03-03 MED ORDER — SODIUM CHLORIDE 0.9 % IV SOLN
2.0000 g | Freq: Once | INTRAVENOUS | Status: AC
  Administered 2019-03-03: 2 g via INTRAVENOUS
  Filled 2019-03-03: qty 20

## 2019-03-03 MED ORDER — ACETAMINOPHEN 500 MG PO TABS
1000.0000 mg | ORAL_TABLET | Freq: Three times a day (TID) | ORAL | Status: DC
  Administered 2019-03-03 – 2019-03-04 (×2): 1000 mg via ORAL
  Filled 2019-03-03 (×3): qty 2

## 2019-03-03 MED ORDER — SUGAMMADEX SODIUM 200 MG/2ML IV SOLN
INTRAVENOUS | Status: DC | PRN
  Administered 2019-03-03: 150 mg via INTRAVENOUS

## 2019-03-03 MED ORDER — PROPOFOL 10 MG/ML IV BOLUS
INTRAVENOUS | Status: AC
  Filled 2019-03-03: qty 20

## 2019-03-03 MED ORDER — METRONIDAZOLE IN NACL 5-0.79 MG/ML-% IV SOLN
500.0000 mg | Freq: Once | INTRAVENOUS | Status: AC
  Administered 2019-03-03: 04:00:00 500 mg via INTRAVENOUS
  Filled 2019-03-03: qty 100

## 2019-03-03 MED ORDER — SENNA 8.6 MG PO TABS
1.0000 | ORAL_TABLET | Freq: Two times a day (BID) | ORAL | Status: DC
  Administered 2019-03-03 – 2019-03-04 (×2): 8.6 mg via ORAL
  Filled 2019-03-03 (×2): qty 1

## 2019-03-03 MED ORDER — FENTANYL CITRATE (PF) 100 MCG/2ML IJ SOLN
INTRAMUSCULAR | Status: DC | PRN
  Administered 2019-03-03 (×3): 50 ug via INTRAVENOUS

## 2019-03-03 MED ORDER — ACETAMINOPHEN 325 MG PO TABS: 650.0000 mg | ORAL_TABLET | Freq: Four times a day (QID) | ORAL | Status: DC | PRN

## 2019-03-03 MED ORDER — SODIUM CHLORIDE 0.9 % IV BOLUS
1000.0000 mL | Freq: Once | INTRAVENOUS | Status: AC
  Administered 2019-03-03: 1000 mL via INTRAVENOUS

## 2019-03-03 MED ORDER — OXYCODONE HCL 5 MG PO TABS: 5.0000 mg | ORAL_TABLET | ORAL | Status: DC | PRN

## 2019-03-03 MED ORDER — 0.9 % SODIUM CHLORIDE (POUR BTL) OPTIME
TOPICAL | Status: DC | PRN
  Administered 2019-03-03: 1000 mL

## 2019-03-03 MED ORDER — LACTATED RINGERS IV SOLN
INTRAVENOUS | Status: DC
  Administered 2019-03-03 (×2): via INTRAVENOUS

## 2019-03-03 MED ORDER — FENTANYL CITRATE (PF) 100 MCG/2ML IJ SOLN
12.5000 ug | INTRAMUSCULAR | Status: DC | PRN
  Administered 2019-03-03: 13:00:00 via INTRAVENOUS
  Filled 2019-03-03 (×2): qty 2

## 2019-03-03 MED ORDER — ENOXAPARIN SODIUM 40 MG/0.4ML ~~LOC~~ SOLN: 40.0000 mg | SUBCUTANEOUS | Status: DC

## 2019-03-03 MED ORDER — IOHEXOL 300 MG/ML  SOLN
100.0000 mL | Freq: Once | INTRAMUSCULAR | Status: AC | PRN
  Administered 2019-03-03: 100 mL via INTRAVENOUS

## 2019-03-03 MED ORDER — HYDROXYZINE HCL 10 MG PO TABS
10.0000 mg | ORAL_TABLET | Freq: Three times a day (TID) | ORAL | Status: DC | PRN
  Filled 2019-03-03: qty 1

## 2019-03-03 MED ORDER — SUCCINYLCHOLINE CHLORIDE 20 MG/ML IJ SOLN
INTRAMUSCULAR | Status: DC | PRN
  Administered 2019-03-03: 70 mg via INTRAVENOUS

## 2019-03-03 MED ORDER — PROCHLORPERAZINE MALEATE 10 MG PO TABS
10.0000 mg | ORAL_TABLET | Freq: Four times a day (QID) | ORAL | Status: DC | PRN
  Filled 2019-03-03: qty 1

## 2019-03-03 MED ORDER — GABAPENTIN 100 MG PO CAPS
200.0000 mg | ORAL_CAPSULE | Freq: Every day | ORAL | Status: DC
  Administered 2019-03-03: 200 mg via ORAL
  Filled 2019-03-03: qty 2

## 2019-03-03 MED ORDER — DEXAMETHASONE SODIUM PHOSPHATE 10 MG/ML IJ SOLN
INTRAMUSCULAR | Status: DC | PRN
  Administered 2019-03-03: 8 mg via INTRAVENOUS

## 2019-03-03 MED ORDER — LACTATED RINGERS IV SOLN: INTRAVENOUS | Status: DC

## 2019-03-03 MED ORDER — ROCURONIUM BROMIDE 10 MG/ML (PF) SYRINGE
PREFILLED_SYRINGE | INTRAVENOUS | Status: DC | PRN
  Administered 2019-03-03: 40 mg via INTRAVENOUS

## 2019-03-03 MED ORDER — SUGAMMADEX SODIUM 500 MG/5ML IV SOLN
INTRAVENOUS | Status: AC
  Filled 2019-03-03: qty 5

## 2019-03-03 MED ORDER — DICLOFENAC SODIUM 1 % TD GEL
2.0000 g | Freq: Every day | TRANSDERMAL | Status: DC
  Administered 2019-03-04: 2 g via TOPICAL
  Filled 2019-03-03: qty 100

## 2019-03-03 MED ORDER — LACTATED RINGERS IV SOLN
INTRAVENOUS | Status: DC | PRN
  Administered 2019-03-03: 1000 mL

## 2019-03-03 MED ORDER — EZETIMIBE 10 MG PO TABS: 10.0000 mg | ORAL_TABLET | Freq: Every day | ORAL | Status: DC

## 2019-03-03 MED ORDER — FENTANYL CITRATE (PF) 100 MCG/2ML IJ SOLN
25.0000 ug | Freq: Once | INTRAMUSCULAR | Status: AC
  Administered 2019-03-03: 02:00:00 25 ug via INTRAVENOUS
  Filled 2019-03-03: qty 2

## 2019-03-03 MED ORDER — DEXAMETHASONE SODIUM PHOSPHATE 10 MG/ML IJ SOLN
INTRAMUSCULAR | Status: AC
  Filled 2019-03-03: qty 1

## 2019-03-03 MED ORDER — FENTANYL CITRATE (PF) 250 MCG/5ML IJ SOLN
INTRAMUSCULAR | Status: AC
  Filled 2019-03-03: qty 5

## 2019-03-03 MED ORDER — BUPIVACAINE-EPINEPHRINE 0.25% -1:200000 IJ SOLN
INTRAMUSCULAR | Status: AC
  Filled 2019-03-03: qty 1

## 2019-03-03 MED ORDER — IBUPROFEN 200 MG PO TABS: 600.0000 mg | ORAL_TABLET | Freq: Four times a day (QID) | ORAL | Status: DC | PRN

## 2019-03-03 MED ORDER — EZETIMIBE 10 MG PO TABS
10.0000 mg | ORAL_TABLET | Freq: Every day | ORAL | Status: DC
  Administered 2019-03-04: 10 mg via ORAL
  Filled 2019-03-03: qty 1

## 2019-03-03 MED ORDER — ROCURONIUM BROMIDE 10 MG/ML (PF) SYRINGE
PREFILLED_SYRINGE | INTRAVENOUS | Status: AC
  Filled 2019-03-03: qty 10

## 2019-03-03 MED ORDER — KCL IN DEXTROSE-NACL 20-5-0.45 MEQ/L-%-% IV SOLN
INTRAVENOUS | Status: DC
  Administered 2019-03-03 – 2019-03-04 (×2): via INTRAVENOUS
  Filled 2019-03-03 (×2): qty 1000

## 2019-03-03 MED ORDER — ONDANSETRON HCL 4 MG/2ML IJ SOLN
INTRAMUSCULAR | Status: DC | PRN
  Administered 2019-03-03: 4 mg via INTRAVENOUS

## 2019-03-03 MED ORDER — ACETAMINOPHEN 650 MG RE SUPP: 650.0000 mg | Freq: Four times a day (QID) | RECTAL | Status: DC | PRN

## 2019-03-03 MED ORDER — PROCHLORPERAZINE EDISYLATE 10 MG/2ML IJ SOLN: 5.0000 mg | Freq: Four times a day (QID) | INTRAMUSCULAR | Status: DC | PRN

## 2019-03-03 MED ORDER — BUPIVACAINE-EPINEPHRINE 0.25% -1:200000 IJ SOLN
INTRAMUSCULAR | Status: DC | PRN
  Administered 2019-03-03: 20 mL

## 2019-03-03 MED ORDER — LIDOCAINE 2% (20 MG/ML) 5 ML SYRINGE
INTRAMUSCULAR | Status: DC | PRN
  Administered 2019-03-03: 60 mg via INTRAVENOUS

## 2019-03-03 MED ORDER — ONDANSETRON HCL 4 MG/2ML IJ SOLN
4.0000 mg | Freq: Once | INTRAMUSCULAR | Status: AC
  Administered 2019-03-03: 4 mg via INTRAVENOUS
  Filled 2019-03-03: qty 2

## 2019-03-03 MED ORDER — ONDANSETRON HCL 4 MG/2ML IJ SOLN
INTRAMUSCULAR | Status: AC
  Filled 2019-03-03: qty 2

## 2019-03-03 SURGICAL SUPPLY — 41 items
ADH SKN CLS APL DERMABOND .7 (GAUZE/BANDAGES/DRESSINGS) ×1
APL PRP STRL LF DISP 70% ISPRP (MISCELLANEOUS) ×1
APPLIER CLIP ROT 10 11.4 M/L (STAPLE)
APR CLP MED LRG 11.4X10 (STAPLE)
BAG SPEC RTRVL LRG 6X4 10 (ENDOMECHANICALS) ×1
CHLORAPREP W/TINT 26 (MISCELLANEOUS) ×3 IMPLANT
CLIP APPLIE ROT 10 11.4 M/L (STAPLE) IMPLANT
CLOSURE WOUND 1/2 X4 (GAUZE/BANDAGES/DRESSINGS) ×1
COVER SURGICAL LIGHT HANDLE (MISCELLANEOUS) ×3 IMPLANT
COVER WAND RF STERILE (DRAPES) IMPLANT
CUTTER FLEX LINEAR 45M (STAPLE) ×2 IMPLANT
DECANTER SPIKE VIAL GLASS SM (MISCELLANEOUS) ×3 IMPLANT
DERMABOND ADVANCED (GAUZE/BANDAGES/DRESSINGS) ×2
DERMABOND ADVANCED .7 DNX12 (GAUZE/BANDAGES/DRESSINGS) ×1 IMPLANT
DRAPE LAPAROSCOPIC ABDOMINAL (DRAPES) ×3 IMPLANT
ELECT REM PT RETURN 15FT ADLT (MISCELLANEOUS) ×3 IMPLANT
ENDOLOOP SUT PDS II  0 18 (SUTURE)
ENDOLOOP SUT PDS II 0 18 (SUTURE) IMPLANT
GLOVE SURG ORTHO 8.0 STRL STRW (GLOVE) ×3 IMPLANT
GOWN STRL REUS W/TWL XL LVL3 (GOWN DISPOSABLE) ×6 IMPLANT
KIT BASIN OR (CUSTOM PROCEDURE TRAY) ×3 IMPLANT
KIT TURNOVER KIT A (KITS) IMPLANT
POUCH SPECIMEN RETRIEVAL 10MM (ENDOMECHANICALS) ×3 IMPLANT
RELOAD 45 VASCULAR/THIN (ENDOMECHANICALS) IMPLANT
RELOAD STAPLE 45 2.5 WHT GRN (ENDOMECHANICALS) IMPLANT
RELOAD STAPLE 45 3.5 BLU ETS (ENDOMECHANICALS) IMPLANT
RELOAD STAPLE TA45 3.5 REG BLU (ENDOMECHANICALS) ×3 IMPLANT
SCISSORS LAP 5X35 DISP (ENDOMECHANICALS) ×2 IMPLANT
SET IRRIG TUBING LAPAROSCOPIC (IRRIGATION / IRRIGATOR) ×3 IMPLANT
SET TUBE SMOKE EVAC HIGH FLOW (TUBING) ×3 IMPLANT
SHEARS HARMONIC ACE PLUS 36CM (ENDOMECHANICALS) ×3 IMPLANT
STRIP CLOSURE SKIN 1/2X4 (GAUZE/BANDAGES/DRESSINGS) ×2 IMPLANT
SUT MNCRL AB 4-0 PS2 18 (SUTURE) ×3 IMPLANT
SUT SILK 3 0 SH 30 (SUTURE) ×2 IMPLANT
TOWEL OR 17X26 10 PK STRL BLUE (TOWEL DISPOSABLE) ×3 IMPLANT
TOWEL OR NON WOVEN STRL DISP B (DISPOSABLE) ×3 IMPLANT
TRAY FOLEY MTR SLVR 14FR STAT (SET/KITS/TRAYS/PACK) IMPLANT
TRAY FOLEY MTR SLVR 16FR STAT (SET/KITS/TRAYS/PACK) IMPLANT
TRAY LAPAROSCOPIC (CUSTOM PROCEDURE TRAY) ×3 IMPLANT
TROCAR XCEL BLUNT TIP 100MML (ENDOMECHANICALS) ×3 IMPLANT
TROCAR XCEL NON-BLD 11X100MML (ENDOMECHANICALS) ×3 IMPLANT

## 2019-03-03 NOTE — H&P (Signed)
Sylvia [name] is an 73 y.o. female.   Chief Complaint: abdominal pain HPI:  Pt is a 73 yo F who presented to the ED with <12 hours of abdominal pain in the RLQ.  It gradually increased in severity.  She noted it was worse with movement.  She denied bowel changes. She has had nausea and decreased appetite, but no vomiting.  + chills.  No known fever.    Past Medical History:  Diagnosis Date  . Aphasia   . Arthritis    OA- fingers, toes, back pain, hip pain   . Breast cancer of upper-outer quadrant of left female breast ([place])    left breast cancer  . Complication of anesthesia    slow to wake up  . Disorder of bone and cartilage, unspecified   . Diverticulitis   . Hypothyroid   . Lumbago   . Malignant neoplasm of breast (female), unspecified site   . Normal nuclear stress test    [facility], 07/18/2011  . Osteoarthritis (arthritis due to wear and tear of joints)   . Pain in joint, pelvic region and thigh   . Reflux esophagitis    pt. knowledgeable about dietary intake relative to reflux  . S/P bilateral breast biopsy 2013  . Shortness of breath    /w housework  . Symptomatic menopausal or female climacteric states     Past Surgical History:  Procedure Laterality Date  . BREAST BIOPSY  07/26/2011   Procedure: BREAST BIOPSY WITH NEEDLE LOCALIZATION;  Surgeon: Rolm Bookbinder, MD;  Location: [facility];  Service: General;  Laterality: Right;  Right breast wire guided biospy Needle localization @ [facility]  7:30  . BREAST SURGERY     Procedure: BREAST BIOPSY WITH NEEDLE LOCALIZATION;  [facility]  . CATARACT EXTRACTION  2012  . DILATION AND CURETTAGE OF UTERUS  1978  . ECTOPIC PREGNANCY SURGERY     1980  . EYE SURGERY     R cat. extracted /w IOL  . OVARIAN CYST SURGERY    . TONSILLECTOMY     as a child    Family History  Problem Relation Age of Onset  . Cancer Sister        colon  . Cancer Brother        renal  . Heart disease Brother   . Cancer Mother        leukemia  .  Peripheral vascular disease Mother   . Cancer Father        lung  . Heart attack Brother   . Cancer Maternal Aunt        breast  . Cancer Cousin   . Cancer Cousin   . Anesthesia problems Neg Hx    Social History:  reports that she quit smoking about 38 years ago. She smoked 0.25 packs per day. She has never used smokeless tobacco. She reports that she does not drink alcohol or use drugs.  Allergies:  Allergies  Allergen Reactions  . Benadryl [Diphenhydramine Hcl] Other (See Comments)    "Super hyper"  . Tape     Skin breakdown/redness   . Boniva [Ibandronate Sodium] Other (See Comments)    Neck, shoulder limited mobility   . Peridin-C [Ascorbic Acid] Diarrhea  . Venlafaxine Nausea Only  . Chlorhexidine Gluconate Itching  . Codeine Other (See Comments)    "can't wake up from it."  . Sulfa Antibiotics Rash    Patient unsure of reaction    Meds Current Meds  Medication Sig  .  cholecalciferol (VITAMIN D) 1000 UNITS tablet Take 1,000 Units by mouth 2 (two) times daily.  . diclofenac sodium (VOLTAREN) 1 % GEL Apply 2 g topically daily.  Marland Kitchen ezetimibe (ZETIA) 10 MG tablet Take 1 tablet (10 mg total) by mouth daily.  Marland Kitchen gabapentin (NEURONTIN) 100 MG capsule Take 2 capsules (200 mg total) by mouth at bedtime.  Marland Kitchen ibuprofen (ADVIL,MOTRIN) 100 MG tablet Take 400 mg by mouth every 6 (six) hours as needed for pain.   Marland Kitchen levothyroxine (SYNTHROID) 75 MCG tablet Take 1 tablet (75 mcg total) by mouth daily before breakfast.  . NONFORMULARY OR COMPOUNDED ITEM Take 1 tablet by mouth daily. Tumeric   . vitamin E 400 UNIT capsule Take 800 Units by mouth daily.      Results for orders placed or performed during the hospital encounter of 03/03/19 (from the past 48 hour(s))  Lipase, blood     Status: None   Collection Time: 03/03/19 12:53 AM  Result Value Ref Range   Lipase 29 11 - 51 U/L    Comment: Performed at [facility], [place] [address], [place], [place] [address]   Comprehensive metabolic panel     Status: Abnormal   Collection Time: 03/03/19 12:53 AM  Result Value Ref Range   Sodium 138 135 - 145 mmol/L   Potassium 3.9 3.5 - 5.1 mmol/L   Chloride 106 98 - 111 mmol/L   CO2 22 22 - 32 mmol/L   Glucose, Bld 139 (H) 70 - 99 mg/dL   BUN 9 8 - 23 mg/dL   Creatinine, Ser 0.74 0.44 - 1.00 mg/dL   Calcium 9.1 8.9 - 10.3 mg/dL   Total Protein 7.5 6.5 - 8.1 g/dL   Albumin 4.3 3.5 - 5.0 g/dL   AST 19 15 - 41 U/L   ALT 13 0 - 44 U/L   Alkaline Phosphatase 57 38 - 126 U/L   Total Bilirubin 1.0 0.3 - 1.2 mg/dL   GFR calc non Af Amer >60 >60 mL/min   GFR calc Af Amer >60 >60 mL/min   Anion gap 10 5 - 15    Comment: Performed at [facility], [place] [address] [place], [place] [address]  CBC     Status: Abnormal   Collection Time: 03/03/19 12:53 AM  Result Value Ref Range   WBC 11.1 (H) 4.0 - 10.5 K/uL   RBC 4.48 3.87 - 5.11 MIL/uL   Hemoglobin 14.0 12.0 - 15.0 g/dL   HCT 42.0 36.0 - 46.0 %   MCV 93.8 80.0 - 100.0 fL   MCH 31.3 26.0 - 34.0 pg   MCHC 33.3 30.0 - 36.0 g/dL   RDW 13.2 11.5 - 15.5 %   Platelets 201 150 - 400 K/uL   nRBC 0.0 0.0 - 0.2 %    Comment: Performed at [facility], [place] [address] [place],  [address]  Urinalysis, Routine w reflex microscopic     Status: Abnormal   Collection Time: 03/03/19  1:41 AM  Result Value Ref Range   Color, Urine YELLOW YELLOW   APPearance CLEAR CLEAR   Specific Gravity, Urine 1.019 1.005 - 1.030   pH 7.0 5.0 - 8.0   Glucose, UA NEGATIVE NEGATIVE mg/dL   Hgb urine dipstick NEGATIVE NEGATIVE   Bilirubin Urine NEGATIVE NEGATIVE   Ketones, ur 80 (A) NEGATIVE mg/dL   Protein, ur NEGATIVE NEGATIVE mg/dL   Nitrite NEGATIVE NEGATIVE   Leukocytes,Ua NEGATIVE NEGATIVE    Comment: Performed  at [facility], [place] [address] [place], [place] [address]  SARS Coronavirus 2 by RT PCR (hospital order, performed in [facility] hospital lab)  Nasopharyngeal Nasopharyngeal Swab     Status: None   Collection Time: 03/03/19  3:29 AM   Specimen: Nasopharyngeal Swab  Result Value Ref Range   SARS Coronavirus 2 NEGATIVE NEGATIVE    Comment: (NOTE) If result is NEGATIVE SARS-CoV-2 target nucleic acids are NOT DETECTED. The SARS-CoV-2 RNA is generally detectable in upper and lower  respiratory specimens during the acute phase of infection. The lowest  concentration of SARS-CoV-2 viral copies this assay can detect is 250  copies / mL. A negative result does not preclude SARS-CoV-2 infection  and should not be used as the sole basis for treatment or other  patient management decisions.  A negative result may occur with  improper specimen collection / handling, submission of specimen other  than nasopharyngeal swab, presence of viral mutation(s) within the  areas targeted by this assay, and inadequate number of viral copies  (<250 copies / mL). A negative result must be combined with clinical  observations, patient history, and epidemiological information. If result is POSITIVE SARS-CoV-2 target nucleic acids are DETECTED. The SARS-CoV-2 RNA is generally detectable in upper and lower  respiratory specimens dur ing the acute phase of infection.  Positive  results are indicative of active infection with SARS-CoV-2.  Clinical  correlation with patient history and other diagnostic information is  necessary to determine patient infection status.  Positive results do  not rule out bacterial infection or co-infection with other viruses. If result is PRESUMPTIVE POSTIVE SARS-CoV-2 nucleic acids MAY BE PRESENT.   A presumptive positive result was obtained on the submitted specimen  and confirmed on repeat testing.  While 2019 novel coronavirus  (SARS-CoV-2) nucleic acids may be present in the submitted sample  additional confirmatory testing may be necessary for epidemiological  and / or clinical management purposes  to differentiate  between  SARS-CoV-2 and other Sarbecovirus currently known to infect humans.  If clinically indicated additional testing with an alternate test  methodology [phone]) is advised. The SARS-CoV-2 RNA is generally  detectable in upper and lower respiratory sp ecimens during the acute  phase of infection. The expected result is Negative. Fact Sheet for Patients:  StrictlyIdeas.no Fact Sheet for Healthcare Providers: BankingDealers.co.za This test is not yet approved or cleared by the [place] FDA and has been authorized for detection and/or diagnosis of SARS-CoV-2 by FDA under an Emergency Use Authorization (EUA).  This EUA will remain in effect (meaning this test can be used) for the duration of the COVID-19 declaration under Section 564(b)(1) of the Act, 21 U.S.C. section 360bbb-3(b)(1), unless the authorization is terminated or revoked sooner. Performed at [facility], [place] [address], [place], [place] [address]    Ct Abdomen Pelvis W Contrast  Result Date: 03/03/2019 CLINICAL DATA:  Abdominal pain with appendicitis suspected. EXAM: CT ABDOMEN AND PELVIS WITH CONTRAST TECHNIQUE: Multidetector CT imaging of the abdomen and pelvis was performed using the standard protocol following bolus administration of intravenous contrast. CONTRAST:  154mL OMNIPAQUE IOHEXOL 300 MG/ML  [facility] COMPARISON:  08/07/2010 FINDINGS: Lower chest: There is atelectasis at the lung bases.The heart size is normal. Hepatobiliary: The liver is normal. Cholelithiasis without acute inflammation.There is no biliary ductal dilation. Pancreas: Normal contours without ductal dilatation. No peripancreatic fluid collection. Spleen: No splenic laceration or hematoma. Adrenals/Urinary Tract: --Adrenal glands: No adrenal hemorrhage. --Right kidney/ureter: No  hydronephrosis or perinephric hematoma. --Left kidney/ureter: No hydronephrosis or perinephric hematoma.  --Urinary bladder: Unremarkable. Stomach/Bowel: --Stomach/Duodenum: No hiatal hernia or other gastric abnormality. Normal duodenal course and caliber. --Small bowel: No dilatation or inflammation. --Colon: Rectosigmoid diverticulosis without acute inflammation. --Appendix: The appendix is dilated measuring approximately 1.3 cm proximally and 1 cm distally. Periappendiceal inflammatory changes are noted. Vascular/Lymphatic: Atherosclerotic calcification is present within the non-aneurysmal abdominal aorta, without hemodynamically significant stenosis. --No retroperitoneal lymphadenopathy. --No mesenteric lymphadenopathy. --No pelvic or inguinal lymphadenopathy. Reproductive: Unremarkable Other: No ascites or free air. The abdominal wall is normal. Musculoskeletal. No acute displaced fractures. IMPRESSION: 1. Findings consistent with acute uncomplicated appendicitis. 2.  Aortic Atherosclerosis (ICD10-I70.0). Electronically Signed   By: Constance Holster M.D.   On: 03/03/2019 02:59    Review of Systems  Constitutional: Positive for chills.  HENT: Negative.   Eyes: Negative.   Respiratory: Negative.   Cardiovascular: Negative.   Gastrointestinal: Positive for abdominal pain and nausea. Negative for diarrhea and vomiting.  Genitourinary: Negative.   Musculoskeletal: Negative.   Skin: Negative.   Neurological: Negative.   Endo/Heme/Allergies: Negative.   Psychiatric/Behavioral: Negative.      Blood pressure 123/61, pulse 85, temperature 98.1 F (36.7 C), temperature source Oral, resp. rate 17, height 5\' 2"  (1.575 m), weight 68 kg, SpO2 96 %.   Physical Exam  Constitutional: She is oriented to person, place, and time. She appears well-developed and well-nourished. No distress.  HENT:  Head: Normocephalic and atraumatic.  Eyes: Pupils are equal, round, and reactive to light. Conjunctivae are normal. No scleral icterus.  Neck: Normal range of motion. Neck supple. No thyromegaly present.   Cardiovascular: Normal rate, regular rhythm, normal heart sounds and intact distal pulses.  Respiratory: Effort normal and breath sounds normal.  GI: Soft. She exhibits no distension. There is abdominal tenderness (RLQ). There is guarding (voluntary). There is no rebound.  Musculoskeletal: Normal range of motion.  Lymphadenopathy:    She has no cervical adenopathy.  Neurological: She is alert and oriented to person, place, and time. Coordination normal.  Skin: Skin is warm and dry. No rash noted. She is not diaphoretic. No erythema. No pallor.  Psychiatric: She has a normal mood and affect. Her behavior is normal. Judgment and thought content normal.     Assessment/Plan Acute appendicitis  NPO IVF IV antibiotics  OR for laparoscopic appendectomy with Dr. Harlow Asa.   Discussed procedure with patient.     Stark Klein, MD 03/03/2019, 6:42 AM

## 2019-03-03 NOTE — Discharge Instructions (Signed)
LAPAROSCOPIC SURGERY: POST OP INSTRUCTIONS  ######################################################################  EAT Gradually transition to a high fiber diet with a fiber supplement over the next few weeks after discharge.  Start with a pureed / full liquid diet (see below)  WALK Walk an hour a day.  Control your pain to do that.    CONTROL PAIN Control pain so that you can walk, sleep, tolerate sneezing/coughing, go up/down stairs.  HAVE A BOWEL MOVEMENT DAILY Keep your bowels regular to avoid problems.  OK to try a laxative to override constipation.  OK to use an antidairrheal to slow down diarrhea.  Call if not better after 2 tries  CALL IF YOU HAVE PROBLEMS/CONCERNS Call if you are still struggling despite following these instructions. Call if you have concerns not answered by these instructions  ######################################################################    1. DIET: Follow a light bland diet & liquids the first 24 hours after arrival home, such as soup, liquids, starches, etc.  Be sure to drink plenty of fluids.  Quickly advance to a usual solid diet within a few days.  Avoid fast food or heavy meals as your are more likely to get nauseated or have irregular bowels.  A low-fat, high-fiber diet for the rest of your life is ideal.  2. Take your usually prescribed home medications unless otherwise directed.  3. PAIN CONTROL: a. Pain is best controlled by a usual combination of three different methods TOGETHER: i. Ice/Heat ii. Over the counter pain medication iii. Prescription pain medication b. Most patients will experience some swelling and bruising around the incisions.  Ice packs or heating pads (30-60 minutes up to 6 times a day) will help. Use ice for the first few days to help decrease swelling and bruising, then switch to heat to help relax tight/sore spots and speed recovery.  Some people prefer to use ice alone, heat alone, alternating between ice & heat.   Experiment to what works for you.  Swelling and bruising can take several weeks to resolve.   c. It is helpful to take an over-the-counter pain medication regularly for the first few weeks.  Choose one of the following that works best for you: i. Naproxen (Aleve, etc)  Two 237m tabs twice a day ii. Ibuprofen (Advil, etc) Three 2013mtabs four times a day (every meal & bedtime) iii. Acetaminophen (Tylenol, etc) 500-6508mour times a day (every meal & bedtime) d. A  prescription for pain medication (such as oxycodone, hydrocodone, tramadol, gabapentin, methocarbamol, etc) should be given to you upon discharge.  Take your pain medication as prescribed.  i. If you are having problems/concerns with the prescription medicine (does not control pain, nausea, vomiting, rash, itching, etc), please call us [phone] see if we need to switch you to a different pain medicine that will work better for you and/or control your side effect better. ii. If you need a refill on your pain medication, please give us [place] hour notice.  contact your pharmacy.  They will contact our office to request authorization. Prescriptions will not be filled after 5 pm or on week-ends  4. Avoid getting constipated.   a. Between the surgery and the pain medications, it is common to experience some constipation.   b. Increasing fluid intake and taking a fiber supplement (such as Metamucil, Citrucel, FiberCon, MiraLax, etc) 1-2 times a day regularly will usually help prevent this problem from occurring.   c. A mild laxative (prune juice, Milk of Magnesia, MiraLax, etc) should be taken according to  package directions if there are no bowel movements after 48 hours.   5. Watch out for diarrhea.   a. If you have many loose bowel movements, simplify your diet to bland foods & liquids for a few days.   b. Stop any stool softeners and decrease your fiber supplement.   c. Switching to mild anti-diarrheal medications (Kayopectate, Pepto  Bismol) can help.   d. If this worsens or does not improve, please call us.  6. Wash / shower every day.  You may shower over the dressings as they are waterproof.  Continue to shower over incision(s) after the dressing is off.  7. Remove your waterproof bandages 5 days after surgery.  You may leave the incision open to air.  You may replace a dressing/Band-Aid to cover the incision for comfort if you wish.   8. ACTIVITIES as tolerated:   a. You may resume regular (light) daily activities beginning the next day--such as daily self-care, walking, climbing stairs--gradually increasing activities as tolerated.  If you can walk 30 minutes without difficulty, it is safe to try more intense activity such as jogging, treadmill, bicycling, low-impact aerobics, swimming, etc. b. Save the most intensive and strenuous activity for last such as sit-ups, heavy lifting, contact sports, etc  Refrain from any heavy lifting or straining until you are off narcotics for pain control.   c. DO NOT PUSH THROUGH PAIN.  Let pain be your guide: If it hurts to do something, don't do it.  Pain is your body warning you to avoid that activity for another week until the pain goes down. d. You may drive when you are no longer taking prescription pain medication, you can comfortably wear a seatbelt, and you can safely maneuver your car and apply brakes. e. Sylvia [name] may have sexual intercourse when it is comfortable.  9. FOLLOW UP in our office a. Please call [facility] at [phone] to set up an appointment to see your surgeon in the office for a follow-up appointment approximately 2-3 weeks after your surgery. b. Make sure that you call for this appointment the day you arrive home to insure a convenient appointment time.  10. IF YOU HAVE DISABILITY OR FAMILY LEAVE FORMS, BRING THEM TO THE OFFICE FOR PROCESSING.  DO NOT GIVE THEM TO YOUR DOCTOR.   WHEN TO CALL us [phone]: 1. Poor pain control 2. Reactions / problems with new  medications (rash/itching, nausea, etc)  3. Fever over 101.5 F (38.5 C) 4. Inability to urinate 5. Nausea and/or vomiting 6. Worsening swelling or bruising 7. Continued bleeding from incision. 8. Increased pain, redness, or drainage from the incision   The clinic staff is available to answer your questions during regular business hours (8:30am-5pm).  Please dont hesitate to call and ask to speak to one of our nurses for clinical concerns.   If you have a medical emergency, go to the nearest emergency room or call 911.  A surgeon from [facility] is always on call at the [facility], [place], [place], [place], [place]  [address] ? MAIN: [phone] ? TOLL FREE: [phone] ?  FAX [phone] [email or web address]    Laparoscopic Appendectomy, Adult, Care After This sheet gives you information about how to care for yourself after your procedure. Your health care provider may also give you more specific instructions. If you have problems or questions, contact your health care provider. What can I expect after the procedure? After the  procedure, it is common to have:  Little energy for normal activities.  Mild pain in the area where the incisions were made.  Difficulty passing stool (constipation). This can be caused by: ? Pain medicine. ? A decrease in your activity. Follow these instructions at home: Medicines  Take over-the-counter and prescription medicines only as told by your health care provider.  If you were prescribed an antibiotic medicine, take it as told by your health care provider. Do not stop taking the antibiotic even if you start to feel better.  Do not drive or use heavy machinery while taking prescription pain medicine.  Ask your health care provider if the medicine prescribed to you can cause constipation. You may need to take steps to prevent or treat constipation, such as: ? Drink  enough fluid to keep your urine pale yellow. ? Take over-the-counter or prescription medicines. ? Eat foods that are high in fiber, such as beans, whole grains, and fresh fruits and vegetables. ? Limit foods that are high in fat and processed sugars, such as fried or sweet foods. Incision care   Follow instructions from your health care provider about how to take care of your incisions. Make sure you: ? Wash your hands with soap and water before and after you change your bandage (dressing). If soap and water are not available, use hand sanitizer. ? Change your dressing as told by your health care provider. ? Leave stitches (sutures), skin glue, or adhesive strips in place. These skin closures may need to stay in place for 2 weeks or longer. If adhesive strip edges start to loosen and curl up, you may trim the loose edges. Do not remove adhesive strips completely unless your health care provider tells you to do that.  Check your incision areas every day for signs of infection. Check for: ? Redness, swelling, or pain. ? Fluid or blood. ? Warmth. ? Pus or a bad smell. Bathing  Keep your incisions clean and dry. Clean them as often as told by your health care provider. To do this: 1. Gently wash the incisions with soap and water. 2. Rinse the incisions with water to remove all soap. 3. Pat the incisions dry with a clean towel. Do not rub the incisions.  Do not take baths, swim, or use a hot tub for 2 weeks, or until your health care provider approves. You may take showers after 48 hours. Activity   Do not drive for 24 hours if you were given a sedative during your procedure.  Rest after the procedure. Return to your normal activities as told by your health care provider. Ask your health care provider what activities are safe for you.  For 3 weeks, or for as long as told by your health care provider: ? Do not lift anything that is heavier than 10 lb (4.5 kg), or the limit that you are  told. ? Do not play contact sports. General instructions  If you were sent home with a drain, follow instructions from your health care provider about how to care for it.  Take deep breaths. This helps to prevent your lungs from developing an infection (pneumonia).  Keep all follow-up visits as told by your health care provider. This is important. Contact a health care provider if:  You have redness, swelling, or pain around an incision.  You have fluid or blood coming from an incision.  Your incision feels warm to the touch.  You have pus or a bad smell coming from  an incision or dressing.  Your incision edges break open after your sutures have been removed.  You have increasing pain in your shoulders.  You feel dizzy or you faint.  You develop shortness of breath.  You keep feeling nauseous or you are vomiting.  You have diarrhea or you cannot control your bowel functions.  You lose your appetite.  You develop swelling or pain in your legs.  You develop a rash. Get help right away if you have:  A fever.  Difficulty breathing.  Sharp pains in your chest. Summary  After a laparoscopic appendectomy, it is common to have little energy for normal activities, mild pain in the area of the incisions, and constipation.  Infection is the most common complication after this procedure. Follow your health care provider's instructions about caring for yourself after the procedure.  Rest after the procedure. Return to your normal activities as told by your health care provider.  Contact your health care provider if you notice signs of infection around your incisions or you develop shortness of breath. Get help right away if you have a fever, chest pain, or difficulty breathing. This information is not intended to replace advice given to you by your health care provider. Make sure you discuss any questions you have with your health care provider. Document Released: 04/15/2005  Document Revised: 10/16/2017 Document Reviewed: 10/16/2017 Elsevier Patient Education  2020 [employer].

## 2019-03-03 NOTE — ED Notes (Signed)
Pt aware that urine sample is needed.  

## 2019-03-03 NOTE — ED Notes (Signed)
ED TO INPATIENT HANDOFF REPORT  ED Nurse Name and Phone #: Anderson Malta [phone] S Name/Age/Gender Sylvia [name] 73 y.o. female Room/Bed: WA11/WA11  Code Status   Code Status: Full Code  Home/SNF/Other Home Patient oriented to: self, place, time and situation Is this baseline? Yes   Triage Complete: Triage complete  Chief Complaint Abdominal Pain, thinks appendicitis   Triage Note Pt reports RLQ pain with bloating along with nausea. Pt denies any vomiting.    Allergies Allergies  Allergen Reactions  . Benadryl [Diphenhydramine Hcl] Other (See Comments)    "Super hyper"  . Tape     Skin breakdown/redness   . Boniva [Ibandronate Sodium] Other (See Comments)    Neck, shoulder limited mobility   . Peridin-C [Ascorbic Acid] Diarrhea  . Venlafaxine Nausea Only  . Chlorhexidine Gluconate Itching  . Codeine Other (See Comments)    "can't wake up from it."  . Sulfa Antibiotics Rash    Patient unsure of reaction    Level of Care/Admitting Diagnosis ED Disposition    ED Disposition Condition Comment   Admit  Hospital Area: [facility] [[id number]]  Level of Care: Med-Surg [16]  Covid Evaluation: Confirmed COVID Negative  Diagnosis: Acute appendicitis [id number]  Admitting Physician: CCS, Gustine  Attending Physician: CCS, MD [[id number]  PT Class (Do Not Modify): Observation [104]  PT Acc Code (Do Not Modify): Observation [10022]       B Medical/Surgery History Past Medical History:  Diagnosis Date  . Aphasia   . Arthritis    OA- fingers, toes, back pain, hip pain   . Breast cancer of upper-outer quadrant of left female breast ([place])    left breast cancer  . Complication of anesthesia    slow to wake up  . Disorder of bone and cartilage, unspecified   . Diverticulitis   . Hypothyroid   . Lumbago   . Malignant neoplasm of breast (female), unspecified site   . Normal nuclear stress test    [facility], 07/18/2011  . Osteoarthritis (arthritis  due to wear and tear of joints)   . Pain in joint, pelvic region and thigh   . Reflux esophagitis    pt. knowledgeable about dietary intake relative to reflux  . S/P bilateral breast biopsy 2013  . Shortness of breath    /w housework  . Symptomatic menopausal or female climacteric states    Past Surgical History:  Procedure Laterality Date  . BREAST BIOPSY  07/26/2011   Procedure: BREAST BIOPSY WITH NEEDLE LOCALIZATION;  Surgeon: Rolm Bookbinder, MD;  Location: [facility];  Service: General;  Laterality: Right;  Right breast wire guided biospy Needle localization @ [facility]  7:30  . BREAST SURGERY     Procedure: BREAST BIOPSY WITH NEEDLE LOCALIZATION;  [facility]  . CATARACT EXTRACTION  2012  . DILATION AND CURETTAGE OF UTERUS  1978  . ECTOPIC PREGNANCY SURGERY     1980  . EYE SURGERY     R cat. extracted /w IOL  . OVARIAN CYST SURGERY    . TONSILLECTOMY     as a child     A IV Location/Drains/Wounds Patient Lines/Drains/Airways Status   Active Line/Drains/Airways    Name:   Placement date:   Placement time:   Site:   Days:   Peripheral IV 03/03/19 Right Antecubital   03/03/19    0054    Antecubital   less than 1   Incision 07/26/11 Chest Bilateral   07/26/11    1207  2777          Intake/Output Last 24 hours No intake or output data in the 24 hours ending 03/03/19 1047  Labs/Imaging Results for orders placed or performed during the hospital encounter of 03/03/19 (from the past 48 hour(s))  Lipase, blood     Status: None   Collection Time: 03/03/19 12:53 AM  Result Value Ref Range   Lipase 29 11 - 51 U/L    Comment: Performed at [facility], [place] [address], [place], [place] [address]  Comprehensive metabolic panel     Status: Abnormal   Collection Time: 03/03/19 12:53 AM  Result Value Ref Range   Sodium 138 135 - 145 mmol/L   Potassium 3.9 3.5 - 5.1 mmol/L   Chloride 106 98 - 111 mmol/L   CO2 22 22 - 32 mmol/L   Glucose, Bld 139 (H) 70 - 99 mg/dL   BUN  9 8 - 23 mg/dL   Creatinine, Ser 0.74 0.44 - 1.00 mg/dL   Calcium 9.1 8.9 - 10.3 mg/dL   Total Protein 7.5 6.5 - 8.1 g/dL   Albumin 4.3 3.5 - 5.0 g/dL   AST 19 15 - 41 U/L   ALT 13 0 - 44 U/L   Alkaline Phosphatase 57 38 - 126 U/L   Total Bilirubin 1.0 0.3 - 1.2 mg/dL   GFR calc non Af Amer >60 >60 mL/min   GFR calc Af Amer >60 >60 mL/min   Anion gap 10 5 - 15    Comment: Performed at [facility], [place] [address] [place], [place] [address]  CBC     Status: Abnormal   Collection Time: 03/03/19 12:53 AM  Result Value Ref Range   WBC 11.1 (H) 4.0 - 10.5 K/uL   RBC 4.48 3.87 - 5.11 MIL/uL   Hemoglobin 14.0 12.0 - 15.0 g/dL   HCT 42.0 36.0 - 46.0 %   MCV 93.8 80.0 - 100.0 fL   MCH 31.3 26.0 - 34.0 pg   MCHC 33.3 30.0 - 36.0 g/dL   RDW 13.2 11.5 - 15.5 %   Platelets 201 150 - 400 K/uL   nRBC 0.0 0.0 - 0.2 %    Comment: Performed at [facility], [place] [address], [place], [place] [address]  Urinalysis, Routine w reflex microscopic     Status: Abnormal   Collection Time: 03/03/19  1:41 AM  Result Value Ref Range   Color, Urine YELLOW YELLOW   APPearance CLEAR CLEAR   Specific Gravity, Urine 1.019 1.005 - 1.030   pH 7.0 5.0 - 8.0   Glucose, UA NEGATIVE NEGATIVE mg/dL   Hgb urine dipstick NEGATIVE NEGATIVE   Bilirubin Urine NEGATIVE NEGATIVE   Ketones, ur 80 (A) NEGATIVE mg/dL   Protein, ur NEGATIVE NEGATIVE mg/dL   Nitrite NEGATIVE NEGATIVE   Leukocytes,Ua NEGATIVE NEGATIVE    Comment: Performed at [facility] [address] [place], [place] [address]  SARS Coronavirus 2 by RT PCR (hospital order, performed in [facility] hospital lab) Nasopharyngeal Nasopharyngeal Swab     Status: None   Collection Time: 03/03/19  3:29 AM   Specimen: Nasopharyngeal Swab  Result Value Ref Range   SARS Coronavirus 2 NEGATIVE NEGATIVE    Comment: (NOTE) If result is NEGATIVE SARS-CoV-2 target nucleic acids are NOT DETECTED. The  SARS-CoV-2 RNA is generally detectable in upper and lower  respiratory specimens during the acute phase of infection. The lowest  concentration of SARS-CoV-2 viral  copies this assay can detect is 250  copies / mL. A negative result does not preclude SARS-CoV-2 infection  and should not be used as the sole basis for treatment or other  patient management decisions.  A negative result may occur with  improper specimen collection / handling, submission of specimen other  than nasopharyngeal swab, presence of viral mutation(s) within the  areas targeted by this assay, and inadequate number of viral copies  (<250 copies / mL). A negative result must be combined with clinical  observations, patient history, and epidemiological information. If result is POSITIVE SARS-CoV-2 target nucleic acids are DETECTED. The SARS-CoV-2 RNA is generally detectable in upper and lower  respiratory specimens dur ing the acute phase of infection.  Positive  results are indicative of active infection with SARS-CoV-2.  Clinical  correlation with patient history and other diagnostic information is  necessary to determine patient infection status.  Positive results do  not rule out bacterial infection or co-infection with other viruses. If result is PRESUMPTIVE POSTIVE SARS-CoV-2 nucleic acids MAY BE PRESENT.   A presumptive positive result was obtained on the submitted specimen  and confirmed on repeat testing.  While 2019 novel coronavirus  (SARS-CoV-2) nucleic acids may be present in the submitted sample  additional confirmatory testing may be necessary for epidemiological  and / or clinical management purposes  to differentiate between  SARS-CoV-2 and other Sarbecovirus currently known to infect humans.  If clinically indicated additional testing with an alternate test  methodology [phone]) is advised. The SARS-CoV-2 RNA is generally  detectable in upper and lower respiratory sp ecimens during the acute   phase of infection. The expected result is Negative. Fact Sheet for Patients:  StrictlyIdeas.no Fact Sheet for Healthcare Providers: BankingDealers.co.za This test is not yet approved or cleared by the [place] FDA and has been authorized for detection and/or diagnosis of SARS-CoV-2 by FDA under an Emergency Use Authorization (EUA).  This EUA will remain in effect (meaning this test can be used) for the duration of the COVID-19 declaration under Section 564(b)(1) of the Act, 21 U.S.C. section 360bbb-3(b)(1), unless the authorization is terminated or revoked sooner. Performed at [facility], [place] [address] [place], [place] [address]    Ct Abdomen Pelvis W Contrast  Result Date: 03/03/2019 CLINICAL DATA:  Abdominal pain with appendicitis suspected. EXAM: CT ABDOMEN AND PELVIS WITH CONTRAST TECHNIQUE: Multidetector CT imaging of the abdomen and pelvis was performed using the standard protocol following bolus administration of intravenous contrast. CONTRAST:  162mL OMNIPAQUE IOHEXOL 300 MG/ML  [facility] COMPARISON:  08/07/2010 FINDINGS: Lower chest: There is atelectasis at the lung bases.The heart size is normal. Hepatobiliary: The liver is normal. Cholelithiasis without acute inflammation.There is no biliary ductal dilation. Pancreas: Normal contours without ductal dilatation. No peripancreatic fluid collection. Spleen: No splenic laceration or hematoma. Adrenals/Urinary Tract: --Adrenal glands: No adrenal hemorrhage. --Right kidney/ureter: No hydronephrosis or perinephric hematoma. --Left kidney/ureter: No hydronephrosis or perinephric hematoma. --Urinary bladder: Unremarkable. Stomach/Bowel: --Stomach/Duodenum: No hiatal hernia or other gastric abnormality. Normal duodenal course and caliber. --Small bowel: No dilatation or inflammation. --Colon: Rectosigmoid diverticulosis without acute inflammation. --Appendix: The appendix is  dilated measuring approximately 1.3 cm proximally and 1 cm distally. Periappendiceal inflammatory changes are noted. Vascular/Lymphatic: Atherosclerotic calcification is present within the non-aneurysmal abdominal aorta, without hemodynamically significant stenosis. --No retroperitoneal lymphadenopathy. --No mesenteric lymphadenopathy. --No pelvic or inguinal lymphadenopathy. Reproductive: Unremarkable Other: No ascites or free air. The abdominal wall is normal. Musculoskeletal. No acute displaced fractures. IMPRESSION:  1. Findings consistent with acute uncomplicated appendicitis. 2.  Aortic Atherosclerosis (ICD10-I70.0). Electronically Signed   By: Constance Holster M.D.   On: 03/03/2019 02:59    Pending Labs Unresulted Labs (From admission, onward)    Start     Ordered   03/10/19 0500  Creatinine, serum  (enoxaparin (LOVENOX)    CrCl >/= 30 ml/min)  Weekly,   R    Comments: while on enoxaparin therapy    03/03/19 1035   03/04/19 [id number]  Basic metabolic panel  Tomorrow morning,   R     03/03/19 1035   03/04/19 0500  CBC  Tomorrow morning,   R     03/03/19 1035   03/03/19 1035  CBC  (enoxaparin (LOVENOX)    CrCl >/= 30 ml/min)  Once,   STAT    Comments: Baseline for enoxaparin therapy IF NOT ALREADY DRAWN.  Notify MD if PLT < 100 K.    03/03/19 1035   03/03/19 1035  Creatinine, serum  (enoxaparin (LOVENOX)    CrCl >/= 30 ml/min)  Once,   STAT    Comments: Baseline for enoxaparin therapy IF NOT ALREADY DRAWN.    03/03/19 1035          Vitals/Pain Today's Vitals   03/03/19 0900 03/03/19 0930 03/03/19 1000 03/03/19 1030  BP: (!) 115/55 (!) 116/57 (!) 121/59 (!) 118/58  Pulse: 73 74 69 75  Resp: 18 (!) 22 19 (!) 26  Temp:      TempSrc:      SpO2: 96% 96% 97% 96%  Weight:      Height:      PainSc:        Isolation Precautions No active isolations  Medications Medications  sodium chloride (PF) 0.9 % injection (has no administration in time range)  ezetimibe (ZETIA) tablet 10  mg (has no administration in time range)  gabapentin (NEURONTIN) capsule 200 mg (has no administration in time range)  diclofenac sodium (VOLTAREN) 1 % transdermal gel 2 g (has no administration in time range)  enoxaparin (LOVENOX) injection 40 mg (has no administration in time range)  dextrose 5 % and 0.45 % NaCl with KCl 20 mEq/L infusion (has no administration in time range)  piperacillin-tazobactam (ZOSYN) IVPB 3.375 g (has no administration in time range)  acetaminophen (TYLENOL) tablet 650 mg (has no administration in time range)    Or  acetaminophen (TYLENOL) suppository 650 mg (has no administration in time range)  oxyCODONE (Oxy IR/ROXICODONE) immediate release tablet 5-10 mg (has no administration in time range)  fentaNYL (SUBLIMAZE) injection 12.5-25 mcg (has no administration in time range)  prochlorperazine (COMPAZINE) tablet 10 mg (has no administration in time range)    Or  prochlorperazine (COMPAZINE) injection 5-10 mg (has no administration in time range)  senna (SENOKOT) tablet 8.6 mg (has no administration in time range)  simethicone (MYLICON) chewable tablet 40 mg (has no administration in time range)  hydrOXYzine (ATARAX/VISTARIL) tablet 10 mg (has no administration in time range)  sodium chloride flush (NS) 0.9 % injection 3 mL (3 mLs Intravenous Given 03/03/19 0136)  fentaNYL (SUBLIMAZE) injection 25 mcg (25 mcg Intravenous Given 03/03/19 0139)  ondansetron (ZOFRAN) injection 4 mg (4 mg Intravenous Given 03/03/19 0137)  sodium chloride 0.9 % bolus 1,000 mL (0 mLs Intravenous Stopped 03/03/19 0341)  iohexol (OMNIPAQUE) 300 MG/ML solution 100 mL (100 mLs Intravenous Contrast Given 03/03/19 0238)  cefTRIAXone (ROCEPHIN) 2 g in sodium chloride 0.9 % 100 mL IVPB (0 g Intravenous Stopped 03/03/19  0359)    And  metroNIDAZOLE (FLAGYL) IVPB 500 mg (0 mg Intravenous Stopped 03/03/19 0459)    Mobility walks with person assist Low fall risk   Focused  Assessments    R Recommendations: See Admitting Provider Note  Report given to:   Additional Notes:

## 2019-03-03 NOTE — ED Notes (Signed)
Pt ambulated to bathroom. Pt c/o 9/10 pain to rt lower abdomen.

## 2019-03-03 NOTE — Op Note (Signed)
OPERATIVE REPORT - LAPAROSCOPIC APPENDECTOMY  Preop diagnosis:  Acute appendicitis  Postop diagnosis:  same  Procedure:  Laparoscopic appendectomy  Surgeon:  Armandina Gemma, MD  Assistant:  Neysa Bonito, MD  Anesthesia:  general endotracheal  Estimated blood loss:  minimal  Preparation:  Chlora-prep  Complications:  Thermal injury to adjacent transverse colon.  Repaired by inversion with two 3-0 silk sutures (Dr. Johney Maine)  Indications:  Pt is a 73 yo F who presented to the ED with <12 hours of abdominal pain in the RLQ.  It gradually increased in severity.  She noted it was worse with movement.  She denied bowel changes. She has had nausea and decreased appetite, but no vomiting.  + chills.  No known fever.  WBC elevated at 11K.  CT abd notable for acute appendicitis.  Now for lap appendectomy.  Procedure:  Patient is brought to the operating room and placed in a supine position on the operating room table. Following administration of general anesthesia, a time out was held and the patient's name and procedure is confirmed. Patient is then prepped and draped in the usual strict aseptic fashion.  After ascertaining that an adequate level of anesthesia has been achieved, a peri-umbilical incision is made with a #15 blade. Dissection is carried down to the fascia. Fascia is incised in the midline and the peritoneal cavity is entered cautiously. A #0-vicryl pursestring suture is placed in the fascia. An Hassan cannula is introduced under direct vision and secured with the pursestring suture. The abdomen is insufflated with carbon dioxide. The laparoscope is introduced and the abdomen is explored. Operative ports are placed in the right upper quadrant and left lower quadrant. The appendix is identified.  The appendix is adjacent to and somewhat adherent to the midportion of the transverse colon which is redundant.  The appendix is separated from the colon and attachments are divided with the harmonic  scalpel.  After separating the appendix, an area of thermal injury on the serosal of the mid transverse colon is noted.   Using 3-0 silk sutures, the area of thermal injury is inverted with two sutures.  This was performed by Dr. Neysa Bonito.  The mesoappendix was divided with the harmonic scalpel. Dissection is carried down to the base of the appendix. The base of the appendix is dissected out clearing the junction with the cecal wall. Using an Endo-GIA stapler, the base of the appendix is transected at the junction with the cecal wall. There is good approximation of tissue along the staple line. There is good hemostasis along the staple line. The appendix is placed into an endo-catch bag and withdrawn through the umbilical port. The #0-vicryl pursestring suture is tied securely.  Right lower quadrant is irrigated with warm saline which is evacuated. Good hemostasis is noted. Ports are removed under direct vision. Good hemostasis is noted at the port sites. Pneumoperitoneum is released.  Skin incisions are anesthetized with local anesthetic. Wounds are closed with interrupted 4-0 Monocryl subcuticular sutures. Wounds are washed and dried and Dermabond was applied. The patient is awakened from anesthesia and brought to the recovery room. The patient tolerated the procedure well.  Armandina Gemma, MD [facility] Office: [phone]

## 2019-03-03 NOTE — ED Provider Notes (Addendum)
[facility] DEPT Provider Note  CSN: [id number] Arrival date & time: 03/02/19 2125  Chief Complaint(s) Abdominal Pain  HPI Sylvia [name] is a 73 y.o. female   The history is provided by the patient.  Abdominal Pain Pain location:  RLQ Pain quality: aching   Pain radiates to:  Does not radiate Pain severity:  Moderate Onset quality:  Gradual Duration:  9 hours Timing:  Constant Progression:  Worsening Chronicity:  New Relieved by:  Movement, position changes and palpation Exacerbated by: lying still. Associated symptoms: anorexia, chills and nausea   Associated symptoms: no diarrhea, no dysuria, no fatigue, no fever and no vomiting     Past Medical History Past Medical History:  Diagnosis Date  . Aphasia   . Arthritis    OA- fingers, toes, back pain, hip pain   . Breast cancer of upper-outer quadrant of left female breast ([place])    left breast cancer  . Complication of anesthesia    slow to wake up  . Disorder of bone and cartilage, unspecified   . Diverticulitis   . Hypothyroid   . Lumbago   . Malignant neoplasm of breast (female), unspecified site   . Normal nuclear stress test    [facility], 07/18/2011  . Osteoarthritis (arthritis due to wear and tear of joints)   . Pain in joint, pelvic region and thigh   . Reflux esophagitis    pt. knowledgeable about dietary intake relative to reflux  . S/P bilateral breast biopsy 2013  . Shortness of breath    /w housework  . Symptomatic menopausal or female climacteric states    Patient Active Problem List   Diagnosis Date Noted  . Bilateral plantar fasciitis 04/20/2018  . Anxiety 04/20/2018  . Neuropathy 04/20/2018  . History of breast cancer 04/20/2018  . Pure hypercholesterolemia 04/20/2018  . Hyperplastic colonic polyp 10/14/2016  . Family history of colon cancer 10/14/2016  . Rash and nonspecific skin eruption 09/04/2015  . Osteopenia 10/10/2014  . Need for prophylactic  vaccination against Streptococcus pneumoniae (pneumococcus) 09/28/2012  . Acquired hypothyroidism   . Malignant neoplasm of upper-outer quadrant of left breast in female, estrogen receptor positive ([place])   . Symptomatic menopausal or female climacteric states   . Osteoarthritis (arthritis due to wear and tear of joints)    Home Medication(s) Prior to Admission medications   Medication Sig Start Date End Date Taking? Authorizing Provider  cholecalciferol (VITAMIN D) 1000 UNITS tablet Take 1,000 Units by mouth 2 (two) times daily.   Yes [provider]  diclofenac sodium (VOLTAREN) 1 % GEL Apply 2 g topically daily. 10/13/17  Yes Reed, Tiffany L, DO  ezetimibe (ZETIA) 10 MG tablet Take 1 tablet (10 mg total) by mouth daily. 11/16/18  Yes Reed, Tiffany L, DO  gabapentin (NEURONTIN) 100 MG capsule Take 2 capsules (200 mg total) by mouth at bedtime. 01/20/19  Yes Reed, Tiffany L, DO  ibuprofen (ADVIL,MOTRIN) 100 MG tablet Take 400 mg by mouth every 6 (six) hours as needed for pain.    Yes [provider]  levothyroxine (SYNTHROID) 75 MCG tablet Take 1 tablet (75 mcg total) by mouth daily before breakfast. 12/29/18  Yes Reed, Tiffany L, DO  NONFORMULARY OR COMPOUNDED ITEM Take 1 tablet by mouth daily. Tumeric    Yes [provider]  vitamin E 400 UNIT capsule Take 800 Units by mouth daily.    Yes [provider]  Past Surgical History Past Surgical History:  Procedure Laterality Date  . BREAST BIOPSY  07/26/2011   Procedure: BREAST BIOPSY WITH NEEDLE LOCALIZATION;  Surgeon: Rolm Bookbinder, MD;  Location: [facility];  Service: General;  Laterality: Right;  Right breast wire guided biospy Needle localization @ [facility]  7:30  . BREAST SURGERY     Procedure: BREAST BIOPSY WITH NEEDLE LOCALIZATION;  [facility]  . CATARACT EXTRACTION  2012  . DILATION AND  CURETTAGE OF UTERUS  1978  . ECTOPIC PREGNANCY SURGERY     1980  . EYE SURGERY     R cat. extracted /w IOL  . OVARIAN CYST SURGERY    . TONSILLECTOMY     as a child   Family History Family History  Problem Relation Age of Onset  . Cancer Sister        colon  . Cancer Brother        renal  . Heart disease Brother   . Cancer Mother        leukemia  . Peripheral vascular disease Mother   . Cancer Father        lung  . Heart attack Brother   . Cancer Maternal Aunt        breast  . Cancer Cousin   . Cancer Cousin   . Anesthesia problems Neg Hx     Social History Social History   Tobacco Use  . Smoking status: Former Smoker    Packs/day: 0.25    Quit date: 07/22/1980    Years since quitting: 38.6  . Smokeless tobacco: Never Used  Substance Use Topics  . Alcohol use: No  . Drug use: No   Allergies Benadryl [diphenhydramine hcl], Tape, Boniva [ibandronate sodium], Peridin-c [ascorbic acid], Venlafaxine, Chlorhexidine gluconate, Codeine, and Sulfa antibiotics  Review of Systems Review of Systems  Constitutional: Positive for chills. Negative for fatigue and fever.  Gastrointestinal: Positive for abdominal pain, anorexia and nausea. Negative for diarrhea and vomiting.  Genitourinary: Negative for dysuria.   All other systems are reviewed and are negative for acute change except as noted in the HPI  Physical Exam Vital Signs  I have reviewed the triage vital signs BP 130/61   Pulse 67   Temp 98.1 F (36.7 C) (Oral)   Resp 17   Ht 5\' 2"  (1.575 m)   Wt 68 kg   SpO2 96%   BMI 27.44 kg/m   Physical Exam Vitals signs reviewed.  Constitutional:      General: Sylvia [name] is not in acute distress.    Appearance: Sylvia [name] is well-developed. Sylvia [name] is not diaphoretic.  HENT:     Head: Normocephalic and atraumatic.     Right Ear: External ear normal.     Left Ear: External ear normal.     Nose: Nose normal.  Eyes:     General: No scleral icterus.    Conjunctiva/sclera:  Conjunctivae normal.  Neck:     Musculoskeletal: Normal range of motion.     Trachea: Phonation normal.  Cardiovascular:     Rate and Rhythm: Normal rate and regular rhythm.  Pulmonary:     Effort: Pulmonary effort is normal. No respiratory distress.     Breath sounds: No stridor.  Abdominal:     General: There is no distension.     Tenderness: There is abdominal tenderness in the right lower quadrant. There is rebound.  Musculoskeletal: Normal range of motion.  Neurological:     Mental Status: Sylvia [name] is alert and oriented to  person, place, and time.  Psychiatric:        Behavior: Behavior normal.     ED Results and Treatments Labs (all labs ordered are listed, but only abnormal results are displayed) Labs Reviewed  COMPREHENSIVE METABOLIC PANEL - Abnormal; Notable for the following components:      Result Value   Glucose, Bld 139 (*)    All other components within normal limits  CBC - Abnormal; Notable for the following components:   WBC 11.1 (*)    All other components within normal limits  URINALYSIS, ROUTINE W REFLEX MICROSCOPIC - Abnormal; Notable for the following components:   Ketones, ur 80 (*)    All other components within normal limits  SARS CORONAVIRUS 2 BY RT PCR (HOSPITAL ORDER, [facility])  LIPASE, BLOOD                                                                                                                         EKG  EKG Interpretation  Date/Time:  Tuesday March 02 2019 22:55:19 EST Ventricular Rate:  65 PR Interval:    QRS Duration: 80 QT Interval:  433 QTC Calculation: 451 R Axis:   46 Text Interpretation: Sinus rhythm Low voltage, precordial leads No significant change since last tracing Confirmed by Addison Lank [phone]) on 03/03/2019 1:18:46 AM      Radiology Ct Abdomen Pelvis W Contrast  Result Date: 03/03/2019 CLINICAL DATA:  Abdominal pain with appendicitis suspected. EXAM: CT ABDOMEN AND PELVIS WITH  CONTRAST TECHNIQUE: Multidetector CT imaging of the abdomen and pelvis was performed using the standard protocol following bolus administration of intravenous contrast. CONTRAST:  172mL OMNIPAQUE IOHEXOL 300 MG/ML  SOLN COMPARISON:  08/07/2010 FINDINGS: Lower chest: There is atelectasis at the lung bases.The heart size is normal. Hepatobiliary: The liver is normal. Cholelithiasis without acute inflammation.There is no biliary ductal dilation. Pancreas: Normal contours without ductal dilatation. No peripancreatic fluid collection. Spleen: No splenic laceration or hematoma. Adrenals/Urinary Tract: --Adrenal glands: No adrenal hemorrhage. --Right kidney/ureter: No hydronephrosis or perinephric hematoma. --Left kidney/ureter: No hydronephrosis or perinephric hematoma. --Urinary bladder: Unremarkable. Stomach/Bowel: --Stomach/Duodenum: No hiatal hernia or other gastric abnormality. Normal duodenal course and caliber. --Small bowel: No dilatation or inflammation. --Colon: Rectosigmoid diverticulosis without acute inflammation. --Appendix: The appendix is dilated measuring approximately 1.3 cm proximally and 1 cm distally. Periappendiceal inflammatory changes are noted. Vascular/Lymphatic: Atherosclerotic calcification is present within the non-aneurysmal abdominal aorta, without hemodynamically significant stenosis. --No retroperitoneal lymphadenopathy. --No mesenteric lymphadenopathy. --No pelvic or inguinal lymphadenopathy. Reproductive: Unremarkable Other: No ascites or free air. The abdominal wall is normal. Musculoskeletal. No acute displaced fractures. IMPRESSION: 1. Findings consistent with acute uncomplicated appendicitis. 2.  Aortic Atherosclerosis (ICD10-I70.0). Electronically Signed   By: Constance Holster M.D.   On: 03/03/2019 02:59    Pertinent labs & imaging results that were available during my care of the patient were reviewed by me and considered in my medical decision making (see chart for details).  Medications Ordered in ED Medications  sodium chloride (PF) 0.9 % injection (has no administration in time range)  cefTRIAXone (ROCEPHIN) 2 g in sodium chloride 0.9 % 100 mL IVPB (has no administration in time range)    And  metroNIDAZOLE (FLAGYL) IVPB 500 mg (has no administration in time range)  sodium chloride flush (NS) 0.9 % injection 3 mL (3 mLs Intravenous Given 03/03/19 0136)  fentaNYL (SUBLIMAZE) injection 25 mcg (25 mcg Intravenous Given 03/03/19 0139)  ondansetron (ZOFRAN) injection 4 mg (4 mg Intravenous Given 03/03/19 0137)  sodium chloride 0.9 % bolus 1,000 mL (1,000 mLs Intravenous New Bag/Given 03/03/19 0137)  iohexol (OMNIPAQUE) 300 MG/ML solution 100 mL (100 mLs Intravenous Contrast Given 03/03/19 0238)                                                                                                                                    Procedures Procedures  (including critical care time)  Medical Decision Making / ED Course I have reviewed the nursing notes for this encounter and the patient's prior records (if available in EHR or on provided paperwork).   ROSHANA PARNES was evaluated in Emergency Department on 03/03/2019 for the symptoms described in the history of present illness. Sylvia [name] was evaluated in the context of the global COVID-19 pandemic, which necessitated consideration that the patient might be at risk for infection with the SARS-CoV-2 virus that causes COVID-19. Institutional protocols and algorithms that pertain to the evaluation of patients at risk for COVID-19 are in a state of rapid change based on information released by regulatory bodies including the CDC and federal and state organizations. These policies and algorithms were followed during the patient's care in the ED.  Work-up consistent with uncomplicated diverticulitis. IV abx given. Surgery admit (Dr. Barry Dienes).      Final Clinical Impression(s) / ED Diagnoses Final diagnoses:  Acute appendicitis  with localized peritonitis, without perforation, abscess, or gangrene      This chart was dictated using voice recognition software.  Despite best efforts to proofread,  errors can occur which can change the documentation meaning.     Fatima Blank, MD 03/03/19 0530

## 2019-03-03 NOTE — Anesthesia Preprocedure Evaluation (Addendum)
Anesthesia Evaluation  Patient identified by MRN, date of birth, ID band Patient awake    Reviewed: Allergy & Precautions, NPO status , Patient's Chart, lab work & pertinent test results  Airway Mallampati: II  TM Distance: >3 FB Neck ROM: Full    Dental no notable dental hx.    Pulmonary former smoker,    Pulmonary exam normal breath sounds clear to auscultation       Cardiovascular negative cardio ROS Normal cardiovascular exam Rhythm:Regular Rate:Normal  ECG: SR, rate 65   Neuro/Psych Anxiety negative neurological ROS     GI/Hepatic negative GI ROS, Neg liver ROS,   Endo/Other  Hypothyroidism   Renal/GU negative Renal ROS     Musculoskeletal negative musculoskeletal ROS (+)   Abdominal   Peds  Hematology negative hematology ROS (+)   Anesthesia Other Findings appendicitis  Reproductive/Obstetrics                            Anesthesia Physical Anesthesia Plan  ASA: II  Anesthesia Plan: General   Post-op Pain Management:    Induction: Intravenous  PONV Risk Score and Plan: 4 or greater and Midazolam, Dexamethasone, Ondansetron and Treatment may vary due to age or medical condition  Airway Management Planned: Oral ETT  Additional Equipment:   Intra-op Plan:   Post-operative Plan: Extubation in OR  Informed Consent: I have reviewed the patients History and Physical, chart, labs and discussed the procedure including the risks, benefits and alternatives for the proposed anesthesia with the patient or authorized representative who has indicated his/her understanding and acceptance.     Dental advisory given  Plan Discussed with: CRNA  Anesthesia Plan Comments:         Anesthesia Quick Evaluation

## 2019-03-03 NOTE — ED Notes (Signed)
Urine and culture sent to lab  

## 2019-03-03 NOTE — Anesthesia Procedure Notes (Signed)
Procedure Name: Intubation Date/Time: 03/03/2019 2:31 PM Performed by: Niel Hummer, CRNA Pre-anesthesia Checklist: Patient identified, Emergency Drugs available, Suction available and Patient being monitored Patient Re-evaluated:Patient Re-evaluated prior to induction Oxygen Delivery Method: Circle system utilized Preoxygenation: Pre-oxygenation with 100% oxygen Induction Type: IV induction and Rapid sequence Laryngoscope Size: Mac and 4 Grade View: Grade I Tube type: Oral Tube size: 7.0 mm Number of attempts: 1 Airway Equipment and Method: Stylet Placement Confirmation: ETT inserted through vocal cords under direct vision,  positive ETCO2 and breath sounds checked- equal and bilateral Secured at: 22 cm Tube secured with: Tape Dental Injury: Teeth and Oropharynx as per pre-operative assessment

## 2019-03-04 ENCOUNTER — Encounter (HOSPITAL_COMMUNITY): Payer: Self-pay | Admitting: Surgery

## 2019-03-04 DIAGNOSIS — K3580 Unspecified acute appendicitis: Secondary | ICD-10-CM | POA: Diagnosis not present

## 2019-03-04 LAB — BASIC METABOLIC PANEL
Anion gap: 7 (ref 5–15)
BUN: 9 mg/dL (ref 8–23)
CO2: 23 mmol/L (ref 22–32)
Calcium: 8.2 mg/dL — ABNORMAL LOW (ref 8.9–10.3)
Chloride: 107 mmol/L (ref 98–111)
Creatinine, Ser: 0.82 mg/dL (ref 0.44–1.00)
GFR calc Af Amer: >60 mL/min (ref >60)
GFR calc non Af Amer: >60 mL/min (ref >60)
Glucose, Bld: 145 mg/dL — ABNORMAL HIGH (ref 70–99)
Potassium: 4.2 mmol/L (ref 3.5–5.1)
Sodium: 137 mmol/L (ref 135–145)

## 2019-03-04 LAB — CBC
HCT: 37.3 % (ref 36.0–46.0)
Hemoglobin: 11.9 g/dL — ABNORMAL LOW (ref 12.0–15.0)
MCH: 30.7 pg (ref 26.0–34.0)
MCHC: 31.9 g/dL (ref 30.0–36.0)
MCV: 96.4 fL (ref 80.0–100.0)
Platelets: 151 K/uL (ref 150–400)
RBC: 3.87 MIL/uL (ref 3.87–5.11)
RDW: 13.5 % (ref 11.5–15.5)
WBC: 11.3 K/uL — ABNORMAL HIGH (ref 4.0–10.5)
nRBC: 0 % (ref 0.0–0.2)

## 2019-03-04 MED ORDER — LEVOTHYROXINE SODIUM 75 MCG PO TABS
75.0000 ug | ORAL_TABLET | Freq: Every day | ORAL | Status: DC
  Administered 2019-03-04: 75 ug via ORAL
  Filled 2019-03-04: qty 1

## 2019-03-04 MED ORDER — ACETAMINOPHEN 500 MG PO TABS: 1000.0000 mg | ORAL_TABLET | Freq: Three times a day (TID) | ORAL | Status: AC | PRN

## 2019-03-04 MED ORDER — SODIUM CHLORIDE 0.9 % IV BOLUS
500.0000 mL | Freq: Once | INTRAVENOUS | Status: AC
  Administered 2019-03-04: 500 mL via INTRAVENOUS

## 2019-03-04 NOTE — Transfer of Care (Signed)
Immediate Anesthesia Transfer of Care Note  Patient: Sylvia [name]  Procedure(s) Performed: APPENDECTOMY LAPAROSCOPIC (N/A )  Patient Location: PACU  Anesthesia Type:General  Level of Consciousness: awake, alert  and oriented  Airway & Oxygen Therapy: Patient Spontanous Breathing and Patient connected to face mask oxygen  Post-op Assessment: Report given to RN and Post -op Vital signs reviewed and stable  Post vital signs: Reviewed and stable  Last Vitals:  Vitals Value Taken Time  BP 94/53 03/04/19 0424  Temp 36.7 C 03/04/19 0424  Pulse 56 03/04/19 0424  Resp 14 03/04/19 0424  SpO2 96 % 03/04/19 0424    Last Pain:  Vitals:   03/04/19 0424  TempSrc: Oral  PainSc:       Patients Stated Pain Goal: 3 ([id number])  Complications: No apparent anesthesia complications

## 2019-03-04 NOTE — Discharge Summary (Signed)
[facility] Discharge Summary   Patient ID: Sylvia [name] MRN: [id number] DOB/AGE: 1946-01-05 73 y.o.  Admit date: 03/03/2019 Discharge date: 03/04/2019  Admitting Diagnosis: Acute appendicitis   Discharge Diagnosis Acute appendicitis  Consultants None   Imaging: Ct Abdomen Pelvis W Contrast  Result Date: 03/03/2019 CLINICAL DATA:  Abdominal pain with appendicitis suspected. EXAM: CT ABDOMEN AND PELVIS WITH CONTRAST TECHNIQUE: Multidetector CT imaging of the abdomen and pelvis was performed using the standard protocol following bolus administration of intravenous contrast. CONTRAST:  175mL OMNIPAQUE IOHEXOL 300 MG/ML  SOLN COMPARISON:  08/07/2010 FINDINGS: Lower chest: There is atelectasis at the lung bases.The heart size is normal. Hepatobiliary: The liver is normal. Cholelithiasis without acute inflammation.There is no biliary ductal dilation. Pancreas: Normal contours without ductal dilatation. No peripancreatic fluid collection. Spleen: No splenic laceration or hematoma. Adrenals/Urinary Tract: --Adrenal glands: No adrenal hemorrhage. --Right kidney/ureter: No hydronephrosis or perinephric hematoma. --Left kidney/ureter: No hydronephrosis or perinephric hematoma. --Urinary bladder: Unremarkable. Stomach/Bowel: --Stomach/Duodenum: No hiatal hernia or other gastric abnormality. Normal duodenal course and caliber. --Small bowel: No dilatation or inflammation. --Colon: Rectosigmoid diverticulosis without acute inflammation. --Appendix: The appendix is dilated measuring approximately 1.3 cm proximally and 1 cm distally. Periappendiceal inflammatory changes are noted. Vascular/Lymphatic: Atherosclerotic calcification is present within the non-aneurysmal abdominal aorta, without hemodynamically significant stenosis. --No retroperitoneal lymphadenopathy. --No mesenteric lymphadenopathy. --No pelvic or inguinal lymphadenopathy. Reproductive: Unremarkable Other: No ascites or free air.  The abdominal wall is normal. Musculoskeletal. No acute displaced fractures. IMPRESSION: 1. Findings consistent with acute uncomplicated appendicitis. 2.  Aortic Atherosclerosis (ICD10-I70.0). Electronically Signed   By: Constance Holster M.D.   On: 03/03/2019 02:59    Procedures Dr. Harlow Asa (03/03/19) - Laparoscopic Appendectomy  Hospital Course:  Patient is a 73 year old female who [facility] with RLQ abdominal pain.  Workup showed acute appendicitis.  Patient was admitted and underwent procedure listed above.  Tolerated procedure well and was transferred to the floor.  Diet was advanced as tolerated.  On POD#1, the patient was voiding well, tolerating diet, ambulating well, pain well controlled, vital signs stable, incisions c/d/i and felt stable for discharge home.  Patient will follow up in our office in 2 weeks and knows to call with questions or concerns. She will call to confirm appointment date/time.    Physical Exam: General:  Alert, NAD, pleasant, comfortable Abd:  Soft, ND, mild tenderness, incisions C/D/I   Allergies as of 03/04/2019      Reactions   Benadryl [diphenhydramine Hcl] Other (See Comments)   "Super hyper"   Tape    Skin breakdown/redness   Boniva [ibandronate Sodium] Other (See Comments)   Neck, shoulder limited mobility    Peridin-c [ascorbic Acid] Diarrhea   Venlafaxine Nausea Only   Chlorhexidine Gluconate Itching   Codeine Other (See Comments)   "can't wake up from it."   Sulfa Antibiotics Rash   Patient unsure of reaction      Medication List    TAKE these medications   acetaminophen 500 MG tablet Commonly known as: TYLENOL Take 2 tablets (1,000 mg total) by mouth every 8 (eight) hours as needed for mild pain.   cholecalciferol 1000 units tablet Commonly known as: VITAMIN D Take 1,000 Units by mouth 2 (two) times daily.   diclofenac sodium 1 % Gel Commonly known as: Voltaren Apply 2 g topically daily.   ezetimibe 10 MG tablet Commonly  known as: Zetia Take 1 tablet (10 mg total) by mouth daily.   gabapentin 100  MG capsule Commonly known as: NEURONTIN Take 2 capsules (200 mg total) by mouth at bedtime.   ibuprofen 100 MG tablet Commonly known as: ADVIL Take 400 mg by mouth every 6 (six) hours as needed for pain.   levothyroxine 75 MCG tablet Commonly known as: SYNTHROID Take 1 tablet (75 mcg total) by mouth daily before breakfast.   NONFORMULARY OR COMPOUNDED ITEM Take 1 tablet by mouth daily. Tumeric   vitamin E 400 UNIT capsule Take 800 Units by mouth daily.        Follow-up Information    [facility], [facility] Follow up on 03/23/2019.   Specialty: General Surgery Why: Your appointment is at 9:15 AM.  Be at the office 30 minutes early for check-in.  Bring photo ID and insurance information. Contact information: [address] [place] [address] [phone]           Signed: Brigid Re, [facility] 03/04/2019, 9:29 AM Please see Amion for pager number during day hours 7:00am-4:30pm

## 2019-03-04 NOTE — Progress Notes (Signed)
Discharge instructions given to patient. Patient had no questions. NT or writer will wheel patient out once family is here

## 2019-03-04 NOTE — Anesthesia Postprocedure Evaluation (Signed)
Anesthesia Post Note  Patient: Sylvia [name]  Procedure(s) Performed: APPENDECTOMY LAPAROSCOPIC (N/A )     Patient location during evaluation: PACU Anesthesia Type: General Level of consciousness: awake and alert Pain management: pain level controlled Vital Signs Assessment: post-procedure vital signs reviewed and stable Respiratory status: spontaneous breathing, nonlabored ventilation, respiratory function stable and patient connected to nasal cannula oxygen Cardiovascular status: blood pressure returned to baseline and stable Postop Assessment: no apparent nausea or vomiting Anesthetic complications: no    Last Vitals:  Vitals:   03/04/19 0424 03/04/19 0940  BP: (!) 94/53 (!) 111/52  Pulse: (!) 56 82  Resp: 14 18  Temp: 36.7 C 36.8 C  SpO2: 96% 99%    Last Pain:  Vitals:   03/04/19 0940  TempSrc: Oral  PainSc:                  Sylvia [name]

## 2019-03-08 LAB — SURGICAL PATHOLOGY

## 2019-03-22 ENCOUNTER — Ambulatory Visit: Payer: Medicare Other | Admitting: Internal Medicine

## 2019-03-23 ENCOUNTER — Other Ambulatory Visit: Payer: Self-pay | Admitting: Internal Medicine

## 2019-03-29 ENCOUNTER — Other Ambulatory Visit: Payer: Self-pay

## 2019-03-29 ENCOUNTER — Encounter: Payer: Self-pay | Admitting: Internal Medicine

## 2019-03-29 ENCOUNTER — Ambulatory Visit (INDEPENDENT_AMBULATORY_CARE_PROVIDER_SITE_OTHER): Payer: Medicare Other | Admitting: Internal Medicine

## 2019-03-29 VITALS — BP 102/60 | HR 69 | Temp 97.5°F | Resp 20 | Ht 62.0 in | Wt 146.2 lb

## 2019-03-29 DIAGNOSIS — G629 Polyneuropathy, unspecified: Secondary | ICD-10-CM

## 2019-03-29 DIAGNOSIS — Z9049 Acquired absence of other specified parts of digestive tract: Secondary | ICD-10-CM

## 2019-03-29 DIAGNOSIS — M8949 Other hypertrophic osteoarthropathy, multiple sites: Secondary | ICD-10-CM

## 2019-03-29 DIAGNOSIS — K5901 Slow transit constipation: Secondary | ICD-10-CM

## 2019-03-29 DIAGNOSIS — M159 Polyosteoarthritis, unspecified: Secondary | ICD-10-CM

## 2019-03-29 DIAGNOSIS — E039 Hypothyroidism, unspecified: Secondary | ICD-10-CM | POA: Diagnosis not present

## 2019-03-29 DIAGNOSIS — E7800 Pure hypercholesterolemia, unspecified: Secondary | ICD-10-CM | POA: Diagnosis not present

## 2019-03-29 DIAGNOSIS — R232 Flushing: Secondary | ICD-10-CM | POA: Diagnosis not present

## 2019-03-29 MED ORDER — LEVOTHYROXINE SODIUM 75 MCG PO TABS: 75.0000 ug | ORAL_TABLET | Freq: Every day | ORAL | 3 refills | Status: DC

## 2019-03-29 NOTE — Progress Notes (Signed)
Location:  [facility] clinic Provider:  Leyan Branden L. Mariea Clonts, D.O., C.M.D.  Goals of Care:  Advanced Directives 03/29/2019  Does Patient Have a Medical Advance Directive? Yes  Type of Advance Directive [place]  Does patient want to make changes to medical advance directive? No - Patient declined  Copy of [place] in Chart? -  Would patient like information on creating a medical advance directive? -     Chief Complaint  Patient presents with  . Medical Management of Chronic Issues    4 Month Follow Up    HPI: Patient is a 73 y.o. female seen today for medical management of chronic diseases.    She was fine that afternoon.  Went for [employer].  Started with a little right sided abdominal pain.  She went to [facility] at a relative's advice.  Stayed overnight.  Got appendectomy the next afternoon.  Has f/u tomorrow by phone.  Both her mom and sister had it before.  Didn't even need pain medicine afterward.  She is still having issues with her bowels and bladder.  she's been using metamucil a couple of times per week and one to two colace every couple of days.  Is not blocked up but not going like she once did.  She found an [facility] about metamucil helping cholesterol, as well (psyllium).  She has cut done on gabapentin.  Uses an ice pack for her burning feet and her hot flashes now.     TSH was slightly low in August.    Past Medical History:  Diagnosis Date  . Aphasia   . Arthritis    OA- fingers, toes, back pain, hip pain   . Breast cancer of upper-outer quadrant of left female breast ([place])    left breast cancer  . Complication of anesthesia    slow to wake up  . Disorder of bone and cartilage, unspecified   . Diverticulitis   . Hypothyroid   . Lumbago   . Malignant neoplasm of breast (female), unspecified site   . Normal nuclear stress test    [facility], 07/18/2011  . Osteoarthritis (arthritis due to wear and tear of  joints)   . Pain in joint, pelvic region and thigh   . Reflux esophagitis    pt. knowledgeable about dietary intake relative to reflux  . S/P bilateral breast biopsy 2013  . Shortness of breath    /w housework  . Symptomatic menopausal or female climacteric states     Past Surgical History:  Procedure Laterality Date  . APPENDECTOMY    . BREAST BIOPSY  07/26/2011   Procedure: BREAST BIOPSY WITH NEEDLE LOCALIZATION;  Surgeon: Rolm Bookbinder, MD;  Location: [place];  Service: General;  Laterality: Right;  Right breast wire guided biospy Needle localization @ [facility]  7:30  . BREAST SURGERY     Procedure: BREAST BIOPSY WITH NEEDLE LOCALIZATION;  [place]  . CATARACT EXTRACTION  2012  . DILATION AND CURETTAGE OF UTERUS  1978  . ECTOPIC PREGNANCY SURGERY     1980  . EYE SURGERY     R cat. extracted /w IOL  . LAPAROSCOPIC APPENDECTOMY N/A 03/03/2019   Procedure: APPENDECTOMY LAPAROSCOPIC;  Surgeon: Armandina Gemma, MD;  Location: [facility];  Service: General;  Laterality: N/A;  . OVARIAN CYST SURGERY    . TONSILLECTOMY     as a child    Allergies  Allergen Reactions  . Benadryl [Diphenhydramine Hcl] Other (See Comments)    "  Super hyper"  . Tape     Skin breakdown/redness   . Boniva [Ibandronate Sodium] Other (See Comments)    Neck, shoulder limited mobility   . Peridin-C [Ascorbic Acid] Diarrhea  . Venlafaxine Nausea Only  . Chlorhexidine Gluconate Itching  . Codeine Other (See Comments)    "can't wake up from it."  . Sulfa Antibiotics Rash    Patient unsure of reaction    Outpatient Encounter Medications as of 03/29/2019  Medication Sig  . acetaminophen (TYLENOL) 500 MG tablet Take 2 tablets (1,000 mg total) by mouth every 8 (eight) hours as needed for mild pain.  . cholecalciferol (VITAMIN D) 1000 UNITS tablet Take 1,000 Units by mouth 2 (two) times daily.  . diclofenac sodium (VOLTAREN) 1 % GEL Apply 2 g topically daily.  Marland Kitchen ezetimibe (ZETIA) 10 MG tablet Take 1 tablet (10 mg  total) by mouth daily.  Marland Kitchen gabapentin (NEURONTIN) 100 MG capsule Take 2 capsules (200 mg total) by mouth at bedtime.  Marland Kitchen ibuprofen (ADVIL,MOTRIN) 100 MG tablet Take 400 mg by mouth every 6 (six) hours as needed for pain.   . NONFORMULARY OR COMPOUNDED ITEM Take 1 tablet by mouth daily. Tumeric   . SYNTHROID 75 MCG tablet TAKE 1 TABLET (75 MCG TOTAL) BY MOUTH DAILY BEFORE BREAKFAST.  Marland Kitchen vitamin E 400 UNIT capsule Take 800 Units by mouth daily.    No facility-administered encounter medications on file as of 03/29/2019.     Review of Systems:  Review of Systems  Constitutional: Negative for chills, fever and malaise/fatigue.  HENT: Negative for congestion, hearing loss and sore throat.   Eyes: Negative for blurred vision.  Respiratory: Negative for cough and shortness of breath.   Cardiovascular: Negative for chest pain, palpitations and leg swelling.  Gastrointestinal: Positive for constipation. Negative for abdominal pain, blood in stool, diarrhea and melena.  Genitourinary: Negative for dysuria.  Musculoskeletal: Negative for falls and joint pain.  Skin: Negative for itching and rash.  Neurological: Positive for tingling and sensory change.  Psychiatric/Behavioral: Negative for depression and memory loss. The patient is not nervous/anxious and does not have insomnia.     Health Maintenance  Topic Date Due  . MAMMOGRAM  02/14/2020  . COLONOSCOPY  12/31/2022  . TETANUS/TDAP  04/16/2023  . INFLUENZA VACCINE  Completed  . DEXA SCAN  Completed  . Hepatitis C Screening  Completed  . PNA vac Low Risk Adult  Completed    Physical Exam: Vitals:   03/29/19 1255  BP: 102/60  Pulse: 69  Resp: 20  Temp: (!) 97.5 F (36.4 C)  TempSrc: Oral  SpO2: 97%  Weight: 146 lb 3.2 oz (66.3 kg)  Height: 5\' 2"  (1.575 m)   Body mass index is 26.74 kg/m. Physical Exam Vitals signs reviewed.  Constitutional:      Appearance: Normal appearance.  HENT:     Head: Normocephalic and atraumatic.    Eyes:     Pupils: Pupils are equal, round, and reactive to light.  Neck:     Musculoskeletal: Neck supple.  Cardiovascular:     Rate and Rhythm: Normal rate and regular rhythm.     Pulses: Normal pulses.     Heart sounds: Normal heart sounds.  Pulmonary:     Effort: Pulmonary effort is normal.     Breath sounds: Normal breath sounds. No wheezing, rhonchi or rales.  Abdominal:     General: Bowel sounds are normal. There is no distension.     Palpations: Abdomen is soft.  There is no mass.     Tenderness: There is no abdominal tenderness. There is no guarding or rebound.     Comments: Laparoscopy sites healing well--very faint  Musculoskeletal: Normal range of motion.     Right lower leg: No edema.     Left lower leg: No edema.  Skin:    General: Skin is warm and dry.  Neurological:     General: No focal deficit present.     Mental Status: She is alert and oriented to person, place, and time.     Cranial Nerves: No cranial nerve deficit.     Sensory: Sensory deficit present.     Motor: No weakness.     Gait: Gait normal.  Psychiatric:        Mood and Affect: Mood normal.        Behavior: Behavior normal.        Thought Content: Thought content normal.        Judgment: Judgment normal.     Labs reviewed: Basic Metabolic Panel: Recent Labs    11/11/18 0854 12/28/18 0910 03/03/19 0053 03/04/19 0339  NA 140  --  138 137  K 5.1  --  3.9 4.2  CL 105  --  106 107  CO2 29  --  22 23  GLUCOSE 90  --  139* 145*  BUN 10  --  9 9  CREATININE 0.85  --  0.74 0.82  CALCIUM 9.2  --  9.1 8.2*  TSH 0.37* 0.28*  --   --    Liver Function Tests: Recent Labs    11/11/18 0854 03/03/19 0053  AST 13 19  ALT 8 13  ALKPHOS  --  57  BILITOT 0.6 1.0  PROT 7.1 7.5  ALBUMIN  --  4.3   Recent Labs    03/03/19 0053  LIPASE 29   No results for input(s): AMMONIA in the last 8760 hours. CBC: Recent Labs    11/11/18 0854 03/03/19 0053 03/04/19 0339  WBC 3.7* 11.1* 11.3*   NEUTROABS 1,968  --   --   HGB 14.3 14.0 11.9*  HCT 43.5 42.0 37.3  MCV 92.8 93.8 96.4  PLT 223 201 151   Lipid Panel: Recent Labs    11/11/18 0854 12/28/18 0910  CHOL 225* 202*  HDL 64 58  LDLCALC 133* 118*  TRIG 152* 149  CHOLHDL 3.5 3.5   No results found for: HGBA1C  Procedures since last visit: Ct Abdomen Pelvis W Contrast  Result Date: 03/03/2019 CLINICAL DATA:  Abdominal pain with appendicitis suspected. EXAM: CT ABDOMEN AND PELVIS WITH CONTRAST TECHNIQUE: Multidetector CT imaging of the abdomen and pelvis was performed using the standard protocol following bolus administration of intravenous contrast. CONTRAST:  175mL OMNIPAQUE IOHEXOL 300 MG/ML  SOLN COMPARISON:  08/07/2010 FINDINGS: Lower chest: There is atelectasis at the lung bases.The heart size is normal. Hepatobiliary: The liver is normal. Cholelithiasis without acute inflammation.There is no biliary ductal dilation. Pancreas: Normal contours without ductal dilatation. No peripancreatic fluid collection. Spleen: No splenic laceration or hematoma. Adrenals/Urinary Tract: --Adrenal glands: No adrenal hemorrhage. --Right kidney/ureter: No hydronephrosis or perinephric hematoma. --Left kidney/ureter: No hydronephrosis or perinephric hematoma. --Urinary bladder: Unremarkable. Stomach/Bowel: --Stomach/Duodenum: No hiatal hernia or other gastric abnormality. Normal duodenal course and caliber. --Small bowel: No dilatation or inflammation. --Colon: Rectosigmoid diverticulosis without acute inflammation. --Appendix: The appendix is dilated measuring approximately 1.3 cm proximally and 1 cm distally. Periappendiceal inflammatory changes are noted. Vascular/Lymphatic: Atherosclerotic calcification is present within  the non-aneurysmal abdominal aorta, without hemodynamically significant stenosis. --No retroperitoneal lymphadenopathy. --No mesenteric lymphadenopathy. --No pelvic or inguinal lymphadenopathy. Reproductive: Unremarkable Other:  No ascites or free air. The abdominal wall is normal. Musculoskeletal. No acute displaced fractures. IMPRESSION: 1. Findings consistent with acute uncomplicated appendicitis. 2.  Aortic Atherosclerosis (ICD10-I70.0). Electronically Signed   By: Constance Holster M.D.   On: 03/03/2019 02:59    Assessment/Plan 1. Pure hypercholesterolemia - lipids remain high -she will add psyllium as regular bowel regimen to try to lower this -has not tolerated typical statin therapy and already working on diet, trying to maintain exercise program despite arthritis and neuropathy, cont zetia - 36moFLP; Future - 56moCBCwdiffplt; Future  2. Acquired hypothyroidism -recently adjusted levothyroxine again after results Lab Results  Component Value Date   TSH 0.28 (L) 12/28/2018  - 48moTSH; Future - levothyroxine (SYNTHROID) 75 MCG tablet; Take 1 tablet (75 mcg total) by mouth daily before breakfast.  Dispense: 90 tablet; Refill: 3  3. Neuropathy -cont gabapentin therapy, topicals - 76moFLP; Future  4. Primary osteoarthritis involving multiple joints -cont voltaren, rare ibuprofen use only, tylenol - 39moCMPGFR; Future  5. S/P laparoscopic appendectomy - recovering well  - 35moCBCwdiffplt; Future  6. Hot flashes -cont low dose gabapentin (does not tolerate more)   7. Slow transit constipation -start regular metamucil/psyllium  Labs/tests ordered:   Lab Orders     31moTSH     40moFLP     53moCBCwdiffplt     30moCMPGFR  Next appt:  6 mos for med mgt, fasting labs before   Josedejesus Marcum L. Yovana Scogin, D.O. [facility] [address], [place] [address] Cell Phone (Mon-Fri 8am-5pm):  [phone] On Call:  [phone] & follow prompts after 5pm & weekends Office Phone:  [phone] Office Fax:  [phone]

## 2019-03-29 NOTE — Telephone Encounter (Signed)
Awaiting TSH results. RX request sent to [employer], DO to confirm that we need to wait until results result back to confirm efficacy of current dose

## 2019-04-02 DIAGNOSIS — Z01419 Encounter for gynecological examination (general) (routine) without abnormal findings: Secondary | ICD-10-CM | POA: Diagnosis not present

## 2019-04-02 DIAGNOSIS — Z853 Personal history of malignant neoplasm of breast: Secondary | ICD-10-CM | POA: Diagnosis not present

## 2019-04-06 ENCOUNTER — Encounter: Payer: Self-pay | Admitting: Internal Medicine

## 2019-04-16 ENCOUNTER — Encounter: Payer: Medicare Other | Admitting: Family

## 2019-04-19 ENCOUNTER — Encounter: Payer: Medicare Other | Admitting: Family

## 2019-04-19 ENCOUNTER — Ambulatory Visit: Payer: Self-pay

## 2019-04-20 ENCOUNTER — Other Ambulatory Visit: Payer: Self-pay

## 2019-04-20 ENCOUNTER — Ambulatory Visit (INDEPENDENT_AMBULATORY_CARE_PROVIDER_SITE_OTHER): Payer: Medicare Other | Admitting: Nurse Practitioner

## 2019-04-20 ENCOUNTER — Encounter: Payer: Self-pay | Admitting: Nurse Practitioner

## 2019-04-20 DIAGNOSIS — Z0000 Encounter for general adult medical examination without abnormal findings: Secondary | ICD-10-CM

## 2019-04-20 NOTE — Patient Instructions (Signed)
Sylvia [name] , Thank you for taking time to come for your Medicare Wellness Visit. I appreciate your ongoing commitment to your health goals. Please review the following plan we discussed and let me know if I can assist you in the future.   Screening recommendations/referrals: Colonoscopy up to date Mammogram up to date Bone Density up to date Recommended yearly ophthalmology/optometry visit for glaucoma screening and checkup Recommended yearly dental visit for hygiene and checkup  Vaccinations: Influenza vaccine up to date Pneumococcal vaccine up to date Tdap vaccine up to date  Shingles vaccine recommended to get [facility] at your local pharmacy  Advanced directives: on file.   Conditions/risks identified: cardiovascular risk.  Next appointment: 1 year   Preventive Care 74 Years and Older, Female Preventive care refers to lifestyle choices and visits with your health care provider that can promote health and wellness. What does preventive care include?  A yearly physical exam. This is also called an annual well check.  Dental exams once or twice a year.  Routine eye exams. Ask your health care provider how often you should have your eyes checked.  Personal lifestyle choices, including:  Daily care of your teeth and gums.  Regular physical activity.  Eating a healthy diet.  Avoiding tobacco and drug use.  Limiting alcohol use.  Practicing safe sex.  Taking low-dose aspirin every day.  Taking vitamin and mineral supplements as recommended by your health care provider. What happens during an annual well check? The services and screenings done by your health care provider during your annual well check will depend on your age, overall health, lifestyle risk factors, and family history of disease. Counseling  Your health care provider may ask you questions about your:  Alcohol use.  Tobacco use.  Drug use.  Emotional well-being.  Home and relationship  well-being.  Sexual activity.  Eating habits.  History of falls.  Memory and ability to understand (cognition).  Work and work Statistician.  Reproductive health. Screening  You may have the following tests or measurements:  Height, weight, and BMI.  Blood pressure.  Lipid and cholesterol levels. These may be checked every 5 years, or more frequently if you are over 49 years old.  Skin check.  Lung cancer screening. You may have this screening every year starting at age 74 if you have a 30-pack-year history of smoking and currently smoke or have quit within the past 15 years.  Fecal occult blood test (FOBT) of the stool. You may have this test every year starting at age 74.  Flexible sigmoidoscopy or colonoscopy. You may have a sigmoidoscopy every 5 years or a colonoscopy every 10 years starting at age 6.  Hepatitis C blood test.  Hepatitis B blood test.  Sexually transmitted disease (STD) testing.  Diabetes screening. This is done by checking your blood sugar (glucose) after you have not eaten for a while (fasting). You may have this done every 1-3 years.  Bone density scan. This is done to screen for osteoporosis. You may have this done starting at age 74.  Mammogram. This may be done every 1-2 years. Talk to your health care provider about how often you should have regular mammograms. Talk with your health care provider about your test results, treatment options, and if necessary, the need for more tests. Vaccines  Your health care provider may recommend certain vaccines, such as:  Influenza vaccine. This is recommended every year.  Tetanus, diphtheria, and acellular pertussis (Tdap, Td) vaccine. You may need a  Td booster every 10 years.  Zoster vaccine. You may need this after age 74.  Pneumococcal 13-valent conjugate (PCV13) vaccine. One dose is recommended after age 74.  Pneumococcal polysaccharide (PPSV23) vaccine. One dose is recommended after age  74. Talk to your health care provider about which screenings and vaccines you need and how often you need them. This information is not intended to replace advice given to you by your health care provider. Make sure you discuss any questions you have with your health care provider. Document Released: 05/12/2015 Document Revised: 01/03/2016 Document Reviewed: 02/14/2015 Elsevier Interactive Patient Education  2017 [place] Prevention in the Home Falls can cause injuries. They can happen to people of all ages. There are many things you can do to make your home safe and to help prevent falls. What can I do on the outside of my home?  Regularly fix the edges of walkways and driveways and fix any cracks.  Remove anything that might make you trip as you walk through a door, such as a raised step or threshold.  Trim any bushes or trees on the path to your home.  Use bright outdoor lighting.  Clear any walking paths of anything that might make someone trip, such as rocks or tools.  Regularly check to see if handrails are loose or broken. Make sure that both sides of any steps have handrails.  Any raised decks and porches should have guardrails on the edges.  Have any leaves, snow, or ice cleared regularly.  Use sand or salt on walking paths during winter.  Clean up any spills in your garage right away. This includes oil or grease spills. What can I do in the bathroom?  Use night lights.  Install grab bars by the toilet and in the tub and shower. Do not use towel bars as grab bars.  Use non-skid mats or decals in the tub or shower.  If you need to sit down in the shower, use a plastic, non-slip stool.  Keep the floor dry. Clean up any water that spills on the floor as soon as it happens.  Remove soap buildup in the tub or shower regularly.  Attach bath mats securely with double-sided non-slip rug tape.  Do not have throw rugs and other things on the floor that can make  you trip. What can I do in the bedroom?  Use night lights.  Make sure that you have a light by your bed that is easy to reach.  Do not use any sheets or blankets that are too big for your bed. They should not hang down onto the floor.  Have a firm chair that has side arms. You can use this for support while you get dressed.  Do not have throw rugs and other things on the floor that can make you trip. What can I do in the kitchen?  Clean up any spills right away.  Avoid walking on wet floors.  Keep items that you use a lot in easy-to-reach places.  If you need to reach something above you, use a strong step stool that has a grab bar.  Keep electrical cords out of the way.  Do not use floor polish or wax that makes floors slippery. If you must use wax, use non-skid floor wax.  Do not have throw rugs and other things on the floor that can make you trip. What can I do with my stairs?  Do not leave any items on the stairs.  Make sure that there are handrails on both sides of the stairs and use them. Fix handrails that are broken or loose. Make sure that handrails are as long as the stairways.  Check any carpeting to make sure that it is firmly attached to the stairs. Fix any carpet that is loose or worn.  Avoid having throw rugs at the top or bottom of the stairs. If you do have throw rugs, attach them to the floor with carpet tape.  Make sure that you have a light switch at the top of the stairs and the bottom of the stairs. If you do not have them, ask someone to add them for you. What else can I do to help prevent falls?  Wear shoes that:  Do not have high heels.  Have rubber bottoms.  Are comfortable and fit you well.  Are closed at the toe. Do not wear sandals.  If you use a stepladder:  Make sure that it is fully opened. Do not climb a closed stepladder.  Make sure that both sides of the stepladder are locked into place.  Ask someone to hold it for you, if  possible.  Clearly mark and make sure that you can see:  Any grab bars or handrails.  First and last steps.  Where the edge of each step is.  Use tools that help you move around (mobility aids) if they are needed. These include:  Canes.  Walkers.  Scooters.  Crutches.  Turn on the lights when you go into a dark area. Replace any light bulbs as soon as they burn out.  Set up your furniture so you have a clear path. Avoid moving your furniture around.  If any of your floors are uneven, fix them.  If there are any pets around you, be aware of where they are.  Review your medicines with your doctor. Some medicines can make you feel dizzy. This can increase your chance of falling. Ask your doctor what other things that you can do to help prevent falls. This information is not intended to replace advice given to you by your health care provider. Make sure you discuss any questions you have with your health care provider. Document Released: 02/09/2009 Document Revised: 09/21/2015 Document Reviewed: 05/20/2014 Elsevier Interactive Patient Education  2017 [employer].

## 2019-04-20 NOTE — Progress Notes (Signed)
   This service is provided via telemedicine  No vital signs collected/recorded due to the encounter was a telemedicine visit.   Location of patient (ex: home, work): Home  Patient consents to a telephone visit:  Yes  Location of the provider (ex: office, home):  [facility]   Name of any referring provider:  Gayland Curry, DO   Names of all persons participating in the telemedicine service and their role in the encounter: S.Chrae B/CMA, Sherrie Mustache, NP, and Patient   Time spent on call: 7 min with medical assistant

## 2019-04-20 NOTE — Progress Notes (Signed)
Subjective:   Sylvia [name] is a 73 y.o. female who presents for Medicare Annual (Subsequent) preventive examination.  Review of Systems:   Cardiac Risk Factors include: family history of premature cardiovascular disease;dyslipidemia;advanced age (>28men, >31 women);sedentary lifestyle     Objective:     Vitals: There were no vitals taken for this visit.  There is no height or weight on file to calculate BMI.  Advanced Directives 04/20/2019 03/29/2019 03/03/2019 03/02/2019 04/14/2018 10/13/2017 04/11/2017  Does Patient Have a Medical Advance Directive? Yes Yes - Yes Yes Yes Yes  Type of Advance Directive [place];Living will Healthcare Power of [place] of [place] of [place];Living will [place];Living will  Does patient want to make changes to medical advance directive? No - Patient declined No - Patient declined - No - Patient declined No - Patient declined No - Patient declined No - Patient declined  Copy of [place] in Chart? Yes - validated most recent copy scanned in chart (See row information) - No - copy requested No - copy requested Yes - validated most recent copy scanned in chart (See row information) Yes No - copy requested  Would patient like information on creating a medical advance directive? - - - - - - -    Tobacco Social History   Tobacco Use  Smoking Status Former Smoker  . Packs/day: 0.25  . Types: Cigarettes  . Quit date: 07/22/1980  . Years since quitting: 38.7  Smokeless Tobacco Never Used     Counseling given: Not Answered   Clinical Intake:  Pre-visit preparation completed: Yes  Pain : No/denies pain     BMI - recorded: 26.74 Nutritional Status: BMI 25 -29 Overweight Nutritional Risks: None Diabetes: No  How often do you need to have someone help you when you read instructions, pamphlets,  or other written materials from your doctor or pharmacy?: 1 - Never What is the last grade level you completed in school?: 12th grade  Interpreter Needed?: No     Past Medical History:  Diagnosis Date  . Aphasia   . Arthritis    OA- fingers, toes, back pain, hip pain   . Breast cancer of upper-outer quadrant of left female breast ([place])    left breast cancer  . Complication of anesthesia    slow to wake up  . Disorder of bone and cartilage, unspecified   . Diverticulitis   . Hypothyroid   . Lumbago   . Malignant neoplasm of breast (female), unspecified site   . Normal nuclear stress test    [facility], 07/18/2011  . Osteoarthritis (arthritis due to wear and tear of joints)   . Pain in joint, pelvic region and thigh   . Reflux esophagitis    pt. knowledgeable about dietary intake relative to reflux  . S/P bilateral breast biopsy 2013  . Shortness of breath    /w housework  . Symptomatic menopausal or female climacteric states    Past Surgical History:  Procedure Laterality Date  . APPENDECTOMY    . BREAST BIOPSY  07/26/2011   Procedure: BREAST BIOPSY WITH NEEDLE LOCALIZATION;  Surgeon: Rolm Bookbinder, MD;  Location: [facility];  Service: General;  Laterality: Right;  Right breast wire guided biospy Needle localization @ [facility]  7:30  . BREAST SURGERY     Procedure: BREAST BIOPSY WITH NEEDLE LOCALIZATION;  [place]  . CATARACT EXTRACTION  2012  .  DILATION AND CURETTAGE OF UTERUS  1978  . ECTOPIC PREGNANCY SURGERY     1980  . EYE SURGERY     R cat. extracted /w IOL  . LAPAROSCOPIC APPENDECTOMY N/A 03/03/2019   Procedure: APPENDECTOMY LAPAROSCOPIC;  Surgeon: Armandina Gemma, MD;  Location: [facility];  Service: General;  Laterality: N/A;  . OVARIAN CYST SURGERY    . TONSILLECTOMY     as a child   Family History  Problem Relation Age of Onset  . Cancer Sister        colon  . Cancer Brother        renal  . Heart disease Brother   . Cancer Mother        leukemia  . Peripheral  vascular disease Mother   . Cancer Father        lung  . Heart attack Brother   . Cancer Maternal Aunt        breast  . Cancer Cousin   . Cancer Cousin   . Anesthesia problems Neg Hx    Social History   Socioeconomic History  . Marital status: Married    Spouse name: Not on file  . Number of children: Not on file  . Years of education: Not on file  . Highest education level: Not on file  Occupational History  . Not on file  Tobacco Use  . Smoking status: Former Smoker    Packs/day: 0.25    Types: Cigarettes    Quit date: 07/22/1980    Years since quitting: 38.7  . Smokeless tobacco: Never Used  Substance and Sexual Activity  . Alcohol use: No  . Drug use: No  . Sexual activity: Not Currently    Birth control/protection: None  Other Topics Concern  . Not on file  Social History Narrative  . Not on file   Social Determinants of Health   Financial Resource Strain:   . Difficulty of Paying Living Expenses: Not on file  Food Insecurity:   . Worried About Charity fundraiser in the Last Year: Not on file  . Ran Out of Food in the Last Year: Not on file  Transportation Needs:   . Lack of Transportation (Medical): Not on file  . Lack of Transportation (Non-Medical): Not on file  Physical Activity:   . Days of Exercise per Week: Not on file  . Minutes of Exercise per Session: Not on file  Stress:   . Feeling of Stress : Not on file  Social Connections:   . Frequency of Communication with Friends and Family: Not on file  . Frequency of Social Gatherings with Friends and Family: Not on file  . Attends Religious Services: Not on file  . Active Member of Clubs or Organizations: Not on file  . Attends Archivist Meetings: Not on file  . Marital Status: Not on file    Outpatient Encounter Medications as of 04/20/2019  Medication Sig  . acetaminophen (TYLENOL) 500 MG tablet Take 2 tablets (1,000 mg total) by mouth every 8 (eight) hours as needed for mild pain.    . cholecalciferol (VITAMIN D) 1000 UNITS tablet Take 1,000 Units by mouth 2 (two) times daily.  . diclofenac Sodium (VOLTAREN) 1 % GEL Apply 2 g topically as needed.  . ezetimibe (ZETIA) 10 MG tablet Take 1 tablet (10 mg total) by mouth daily.  Marland Kitchen ibuprofen (ADVIL,MOTRIN) 100 MG tablet Take 400 mg by mouth every 6 (six) hours as needed for pain.   Marland Kitchen  levothyroxine (SYNTHROID) 75 MCG tablet Take 1 tablet (75 mcg total) by mouth daily before breakfast.  . vitamin E 400 UNIT capsule Take 800 Units by mouth daily.   . [DISCONTINUED] diclofenac sodium (VOLTAREN) 1 % GEL Apply 2 g topically daily.  . [DISCONTINUED] gabapentin (NEURONTIN) 100 MG capsule Take 2 capsules (200 mg total) by mouth at bedtime.  . [DISCONTINUED] NONFORMULARY OR COMPOUNDED ITEM Take 1 tablet by mouth daily. Tumeric    No facility-administered encounter medications on file as of 04/20/2019.    Activities of Daily Living In your present state of health, do you have any difficulty performing the following activities: 04/20/2019 03/03/2019  Hearing? Y -  Comment when there is a lot of other noises -  Vision? N -  Difficulty concentrating or making decisions? Y -  Comment reports problems with short term memory -  Walking or climbing stairs? N -  Dressing or bathing? N -  Doing errands, shopping? N N  Preparing Food and eating ? N -  Using the Toilet? N -  In the past six months, have you accidently leaked urine? N -  Do you have problems with loss of bowel control? N -  Managing your Medications? N -  Managing your Finances? N -  Housekeeping or managing your Housekeeping? N -  Some recent data might be hidden    Patient Care Team: Gayland Curry, DO as PCP - General (Geriatric Medicine) Starlyn Skeans as Physician Assistant (Dermatology) Magrinat, Virgie Dad, MD as Consulting Physician (Oncology) Wilford Corner, MD as Consulting Physician (Gastroenterology)    Assessment:   This is a routine wellness  examination for [facility].  Exercise Activities and Dietary recommendations Current Exercise Habits: Home exercise routine, Type of exercise: walking, Time (Minutes): 15, Frequency (Times/Week): 4, Weekly Exercise (Minutes/Week): 60, Intensity: Mild  Goals    . Patient Stated     To maintain current weight     . Weight (lb) < 147 lb (66.7 kg)       Fall Risk Fall Risk  04/20/2019 03/29/2019 11/16/2018 04/20/2018 04/14/2018  Falls in the past year? 0 0 0 0 0  Number falls in past yr: 0 - 0 0 0  Injury with Fall? 0 0 0 0 0   Is the patient's home free of loose throw rugs in walkways, pet beds, electrical cords, etc?   yes      Grab bars in the bathroom? yes      Handrails on the stairs?   yes      Adequate lighting?   yes  Timed Get Up and Go performed: na  Depression Screen PHQ 2/9 Scores 04/20/2019 03/29/2019 11/16/2018 04/20/2018  PHQ - 2 Score 0 0 0 0  PHQ- 9 Score - - - -     Cognitive Function MMSE - Mini Mental State Exam 04/14/2018 04/11/2017 04/11/2016 04/13/2015 04/08/2014  Orientation to time 5 4 5 5 5   Orientation to Place 5 5 5 5 5   Registration 3 3 3 3 3   Attention/ Calculation 5 5 5 5 5   Recall 3 3 3 3 2   Language- name 2 objects 2 2 2 2 2   Language- repeat 1 1 1 1 1   Language- follow 3 step command 3 3 3 3 3   Language- read & follow direction 1 1 1 1 1   Write a sentence 1 1 1 1 1   Copy design 1 1 1 1 1   Total score 30 29 30  30  [place] 04/20/2019  What Year? 0 points  What month? 0 points  What time? 0 points  Count back from 20 0 points  Months in reverse 0 points  Repeat phrase 0 points  Total Score 0    Immunization History  Administered Date(s) Administered  . Fluad Quad(high Dose 65+) 02/02/2019  . Influenza Whole 03/18/2014  . Influenza, High Dose Seasonal PF 02/10/2017, 02/09/2018  . Influenza, Seasonal, Injecte, Preservative Fre 02/01/2016  . Influenza-Unspecified 02/05/2010, 03/30/2012, 04/02/2013, 02/21/2015  .  Pneumococcal Conjugate-13 04/08/2014  . Pneumococcal Polysaccharide-23 09/28/2012  . Td 04/29/1978  . Tdap 04/15/2013  . Zoster 01/24/2014    Qualifies for Shingles Vaccine?yes, recommended.   Screening Tests Health Maintenance  Topic Date Due  . MAMMOGRAM  02/14/2020  . COLONOSCOPY  12/31/2022  . TETANUS/TDAP  04/16/2023  . INFLUENZA VACCINE  Completed  . DEXA SCAN  Completed  . Hepatitis C Screening  Completed  . PNA vac Low Risk Adult  Completed    Cancer Screenings: Lung: Low Dose CT Chest recommended if Age 74-80 years, 30 pack-year currently smoking OR have quit w/in 15years. Patient does not qualify. Breast:  Up to date on Mammogram? Yes   Up to date of Bone Density/Dexa? Yes Colorectal: up to date  Additional Screenings:  Hepatitis C Screening: declines     Plan:      I have personally reviewed and noted the following in the patient's chart:   . Medical and social history . Use of alcohol, tobacco or illicit drugs  . Current medications and supplements . Functional ability and status . Nutritional status . Physical activity . Advanced directives . List of other physicians . Hospitalizations, surgeries, and ER visits in previous 12 months . Vitals . Screenings to include cognitive, depression, and falls . Referrals and appointments  In addition, I have reviewed and discussed with patient certain preventive protocols, quality metrics, and best practice recommendations. A written personalized care plan for preventive services as well as general preventive health recommendations were provided to patient.     Lauree Chandler, NP  04/20/2019

## 2019-05-18 ENCOUNTER — Ambulatory Visit: Payer: Medicare Other | Attending: Internal Medicine

## 2019-05-18 DIAGNOSIS — Z23 Encounter for immunization: Secondary | ICD-10-CM | POA: Insufficient documentation

## 2019-05-18 NOTE — Progress Notes (Signed)
   Covid-19 Vaccination Clinic  Name:  Sylvia [name]    MRN: [id number] DOB: 1945-05-02  05/18/2019  [name] was observed post Covid-19 immunization for 15 minutes without incidence. She was provided with Vaccine Information Sheet and instruction to access the V-Safe system.   [name] was instructed to call 911 with any severe reactions post vaccine: Marland Kitchen Difficulty breathing  . Swelling of your face and throat  . A fast heartbeat  . A bad rash all over your body  . Dizziness and weakness    Immunizations Administered    Name Date Dose VIS Date Route   Pfizer COVID-19 Vaccine 05/18/2019  2:26 PM 0.3 mL 04/09/2019 Intramuscular   Manufacturer: [place]   Lot: [id number]   [place]: [id number]

## 2019-06-07 ENCOUNTER — Ambulatory Visit: Payer: Medicare Other | Attending: Internal Medicine

## 2019-06-07 DIAGNOSIS — Z23 Encounter for immunization: Secondary | ICD-10-CM

## 2019-06-07 NOTE — Progress Notes (Signed)
   Covid-19 Vaccination Clinic  Name:  Sylvia [name]    MRN: [id number] DOB: [birth year]  06/07/2019  Sylvia [name] was observed post Covid-19 immunization for 15 minutes without incidence. She was provided with Vaccine Information Sheet and instruction to access the V-Safe system.   Sylvia [name] was instructed to call 911 with any severe reactions post vaccine: Marland Kitchen Difficulty breathing  . Swelling of your face and throat  . A fast heartbeat  . A bad rash all over your body  . Dizziness and weakness    Immunizations Administered    Name Date Dose VIS Date Route   Pfizer COVID-19 Vaccine 06/07/2019  2:58 PM 0.3 mL 04/09/2019 Intramuscular   Manufacturer: [place]   Lot: [id number]   [place]: [id number]

## 2019-09-28 ENCOUNTER — Other Ambulatory Visit: Payer: Medicare Other

## 2019-09-28 ENCOUNTER — Other Ambulatory Visit: Payer: Self-pay

## 2019-09-28 DIAGNOSIS — E7800 Pure hypercholesterolemia, unspecified: Secondary | ICD-10-CM

## 2019-09-28 DIAGNOSIS — Z9049 Acquired absence of other specified parts of digestive tract: Secondary | ICD-10-CM

## 2019-09-28 DIAGNOSIS — M159 Polyosteoarthritis, unspecified: Secondary | ICD-10-CM

## 2019-09-28 DIAGNOSIS — M8949 Other hypertrophic osteoarthropathy, multiple sites: Secondary | ICD-10-CM | POA: Diagnosis not present

## 2019-09-28 DIAGNOSIS — G629 Polyneuropathy, unspecified: Secondary | ICD-10-CM | POA: Diagnosis not present

## 2019-09-28 DIAGNOSIS — E039 Hypothyroidism, unspecified: Secondary | ICD-10-CM | POA: Diagnosis not present

## 2019-09-28 LAB — CBC WITH DIFFERENTIAL/PLATELET
Absolute Monocytes: 287 cells/uL (ref 200–950)
Basophils Absolute: 28 cells/uL (ref 0–200)
Basophils Relative: 0.8 %
Eosinophils Absolute: 49 cells/uL (ref 15–500)
Eosinophils Relative: 1.4 %
HCT: 41.2 % (ref 35.0–45.0)
Hemoglobin: 13.8 g/dL (ref 11.7–15.5)
Lymphs Abs: 1316 cells/uL (ref 850–3900)
MCH: 31.3 pg (ref 27.0–33.0)
MCHC: 33.5 g/dL (ref 32.0–36.0)
MCV: 93.4 fL (ref 80.0–100.0)
MPV: 9.4 fL (ref 7.5–12.5)
Monocytes Relative: 8.2 %
Neutro Abs: 1820 cells/uL (ref 1500–7800)
Neutrophils Relative %: 52 %
Platelets: 221 10*3/uL (ref 140–400)
RBC: 4.41 10*6/uL (ref 3.80–5.10)
RDW: 13 % (ref 11.0–15.0)
Total Lymphocyte: 37.6 %
WBC: 3.5 10*3/uL — ABNORMAL LOW (ref 3.8–10.8)

## 2019-09-28 LAB — COMPLETE METABOLIC PANEL WITH GFR
AG Ratio: 1.5 (calc) (ref 1.0–2.5)
ALT: 9 U/L (ref 6–29)
AST: 15 U/L (ref 10–35)
Albumin: 4.1 g/dL (ref 3.6–5.1)
Alkaline phosphatase (APISO): 60 U/L (ref 37–153)
BUN: 13 mg/dL (ref 7–25)
CO2: 28 mmol/L (ref 20–32)
Calcium: 9 mg/dL (ref 8.6–10.4)
Chloride: 106 mmol/L (ref 98–110)
Creat: 0.77 mg/dL (ref 0.60–0.93)
GFR, Est African American: 88 mL/min/{1.73_m2} (ref >=60)
GFR, Est Non African American: 76 mL/min/{1.73_m2} (ref >=60)
Globulin: 2.7 g/dL (calc) (ref 1.9–3.7)
Glucose, Bld: 86 mg/dL (ref 65–99)
Potassium: 4 mmol/L (ref 3.5–5.3)
Sodium: 140 mmol/L (ref 135–146)
Total Bilirubin: 0.8 mg/dL (ref 0.2–1.2)
Total Protein: 6.8 g/dL (ref 6.1–8.1)

## 2019-09-28 LAB — LIPID PANEL
Cholesterol: 201 mg/dL — ABNORMAL HIGH (ref <200)
HDL: 60 mg/dL (ref >=50)
LDL Cholesterol (Calc): 120 mg/dL (calc) — ABNORMAL HIGH
Non-HDL Cholesterol (Calc): 141 mg/dL (calc) — ABNORMAL HIGH (ref <130)
Total CHOL/HDL Ratio: 3.4 (calc) (ref <5.0)
Triglycerides: 106 mg/dL (ref <150)

## 2019-09-28 LAB — TSH: TSH: 2.03 mIU/L (ref 0.40–4.50)

## 2019-09-29 NOTE — Progress Notes (Signed)
Labs are overall stable.  Review at visit.

## 2019-09-30 ENCOUNTER — Ambulatory Visit: Payer: Medicare Other | Admitting: Internal Medicine

## 2019-10-07 ENCOUNTER — Encounter: Payer: Self-pay | Admitting: Internal Medicine

## 2019-10-07 ENCOUNTER — Other Ambulatory Visit: Payer: Self-pay

## 2019-10-07 ENCOUNTER — Ambulatory Visit (INDEPENDENT_AMBULATORY_CARE_PROVIDER_SITE_OTHER): Payer: Medicare Other | Admitting: Internal Medicine

## 2019-10-07 VITALS — BP 124/78 | HR 68 | Temp 97.8°F | Ht 62.0 in | Wt 142.0 lb

## 2019-10-07 DIAGNOSIS — E039 Hypothyroidism, unspecified: Secondary | ICD-10-CM

## 2019-10-07 DIAGNOSIS — M8589 Other specified disorders of bone density and structure, multiple sites: Secondary | ICD-10-CM | POA: Diagnosis not present

## 2019-10-07 DIAGNOSIS — M159 Polyosteoarthritis, unspecified: Secondary | ICD-10-CM

## 2019-10-07 DIAGNOSIS — G629 Polyneuropathy, unspecified: Secondary | ICD-10-CM | POA: Diagnosis not present

## 2019-10-07 DIAGNOSIS — E7800 Pure hypercholesterolemia, unspecified: Secondary | ICD-10-CM

## 2019-10-07 DIAGNOSIS — K5901 Slow transit constipation: Secondary | ICD-10-CM | POA: Diagnosis not present

## 2019-10-07 DIAGNOSIS — Z853 Personal history of malignant neoplasm of breast: Secondary | ICD-10-CM | POA: Diagnosis not present

## 2019-10-07 DIAGNOSIS — M8949 Other hypertrophic osteoarthropathy, multiple sites: Secondary | ICD-10-CM

## 2019-10-07 NOTE — Progress Notes (Signed)
Location:  [facility] clinic Provider:  Tekela Garguilo L. Mariea Clonts, D.O., C.M.D.  Code Status: DNR Goals of Care:  Advanced Directives 10/07/2019  Does Patient Have a Medical Advance Directive? Yes  Type of Advance Directive Out of facility DNR (pink MOST or yellow form)  Does patient want to make changes to medical advance directive? No - Patient declined  Copy of [place] in Chart? -  Would patient like information on creating a medical advance directive? -  Pre-existing out of facility DNR order (yellow form or pink MOST form) Pink MOST/Yellow Form most recent copy in chart - Physician notified to receive inpatient order     Chief Complaint  Patient presents with  . Medical Management of Chronic Issues    6 month follow up/ discuss lab results     HPI: Patient is a 74 y.o. female seen today for medical management of chronic diseases.    Using magnesium balm on feet Also uses ice packs in a towel also for the neuropathy.  Her brother has been in and out of the hospital with end stage copd and chf.  He's getting a cardioversion due to afib.  We discussed her cholesterol and nexlizet option, but she's concerned about the potential constipation side effect--she's going daily with a high fiber cereal and added fiber one on top.   Could not take fiber supplement.  She will consider the med if her cholesterol is not improving by dec.  She was 145 lbs at home.  Here 148 which is high for her.   She's drinking lemon water at supper that helps her digestion and constipation.    Not walking daily with heat.  Did get exercise bike and rode 3 times per week over winter.  She's been walking 4 days per week.    Going to get her mammo and dexa in October.    Past Medical History:  Diagnosis Date  . Aphasia   . Arthritis    OA- fingers, toes, back pain, hip pain   . Breast cancer of upper-outer quadrant of left female breast ([place])    left breast cancer  . Complication of  anesthesia    slow to wake up  . Disorder of bone and cartilage, unspecified   . Diverticulitis   . Hypothyroid   . Lumbago   . Malignant neoplasm of breast (female), unspecified site   . Normal nuclear stress test    [facility], 07/18/2011  . Osteoarthritis (arthritis due to wear and tear of joints)   . Pain in joint, pelvic region and thigh   . Reflux esophagitis    pt. knowledgeable about dietary intake relative to reflux  . S/P bilateral breast biopsy 2013  . Shortness of breath    /w housework  . Symptomatic menopausal or female climacteric states     Past Surgical History:  Procedure Laterality Date  . APPENDECTOMY    . BREAST BIOPSY  07/26/2011   Procedure: BREAST BIOPSY WITH NEEDLE LOCALIZATION;  Surgeon: Rolm Bookbinder, MD;  Location: [facility];  Service: General;  Laterality: Right;  Right breast wire guided biospy Needle localization @ [facility]  7:30  . BREAST SURGERY     Procedure: BREAST BIOPSY WITH NEEDLE LOCALIZATION;  [place]  . CATARACT EXTRACTION  2012  . DILATION AND CURETTAGE OF UTERUS  1978  . ECTOPIC PREGNANCY SURGERY     1980  . EYE SURGERY     R cat. extracted /w IOL  . LAPAROSCOPIC APPENDECTOMY  N/A 03/03/2019   Procedure: APPENDECTOMY LAPAROSCOPIC;  Surgeon: Armandina Gemma, MD;  Location: [facility];  Service: General;  Laterality: N/A;  . OVARIAN CYST SURGERY    . TONSILLECTOMY     as a child    Allergies  Allergen Reactions  . Benadryl [Diphenhydramine Hcl] Other (See Comments)    "Super hyper"  . Tape     Skin breakdown/redness   . Boniva [Ibandronate Sodium] Other (See Comments)    Neck, shoulder limited mobility   . Peridin-C [Ascorbic Acid] Diarrhea  . Venlafaxine Nausea Only  . Chlorhexidine Gluconate Itching  . Codeine Other (See Comments)    "can't wake up from it."  . Sulfa Antibiotics Rash    Patient unsure of reaction    Outpatient Encounter Medications as of 10/07/2019  Medication Sig  . acetaminophen (TYLENOL) 500 MG tablet Take 2  tablets (1,000 mg total) by mouth every 8 (eight) hours as needed for mild pain.  . Cholecalciferol (VITAMIN D3) 25 MCG (1000 UT) CAPS Take by mouth.  . ezetimibe (ZETIA) 10 MG tablet Take 1 tablet (10 mg total) by mouth daily.  Marland Kitchen ibuprofen (ADVIL,MOTRIN) 100 MG tablet Take 400 mg by mouth every 6 (six) hours as needed for pain.   Marland Kitchen levothyroxine (SYNTHROID) 75 MCG tablet Take 1 tablet (75 mcg total) by mouth daily before breakfast.  . vitamin E 400 UNIT capsule Take 800 Units by mouth daily.   . [DISCONTINUED] cholecalciferol (VITAMIN D) 1000 UNITS tablet Take 1,000 Units by mouth 2 (two) times daily.  . [DISCONTINUED] diclofenac Sodium (VOLTAREN) 1 % GEL Apply 2 g topically as needed.   No facility-administered encounter medications on file as of 10/07/2019.    Review of Systems:  Review of Systems  Constitutional: Negative for chills, fever and malaise/fatigue.  Eyes: Negative for blurred vision.  Respiratory: Negative for cough and shortness of breath.   Cardiovascular: Negative for chest pain, palpitations and leg swelling.  Gastrointestinal: Positive for constipation. Negative for abdominal pain, blood in stool, diarrhea, heartburn, melena, nausea and vomiting.       Under control now  Genitourinary: Negative for dysuria.  Musculoskeletal: Positive for joint pain. Negative for falls.  Skin: Negative for itching and rash.  Neurological: Positive for tingling and sensory change.       Feet remain numb but plantar fasciitis pain better and able to walk and cycle  Psychiatric/Behavioral: Negative for depression and memory loss. The patient is not nervous/anxious and does not have insomnia.     Health Maintenance  Topic Date Due  . INFLUENZA VACCINE  11/28/2019  . COLONOSCOPY  12/31/2022  . TETANUS/TDAP  04/16/2023  . DEXA SCAN  Completed  . COVID-19 Vaccine  Completed  . Hepatitis C Screening  Completed  . PNA vac Low Risk Adult  Completed    Physical Exam: Vitals:    10/07/19 1100  BP: 124/78  Pulse: 68  Temp: 97.8 F (36.6 C)  SpO2: 97%  Weight: 142 lb (64.4 kg)  Height: 5\' 2"  (1.575 m)   Body mass index is 25.97 kg/m. Physical Exam Vitals reviewed.  Constitutional:      Appearance: Normal appearance.  HENT:     Head: Normocephalic and atraumatic.  Cardiovascular:     Rate and Rhythm: Normal rate and regular rhythm.     Pulses: Normal pulses.     Heart sounds: Normal heart sounds.  Pulmonary:     Effort: Pulmonary effort is normal.     Breath sounds: Normal  breath sounds. No wheezing, rhonchi or rales.  Abdominal:     General: Bowel sounds are normal.  Musculoskeletal:        General: Normal range of motion.     Right lower leg: No edema.     Left lower leg: No edema.  Skin:    General: Skin is warm and dry.  Neurological:     General: No focal deficit present.     Mental Status: She is alert and oriented to person, place, and time.  Psychiatric:        Mood and Affect: Mood normal.        Behavior: Behavior normal.        Thought Content: Thought content normal.        Judgment: Judgment normal.     Labs reviewed: Basic Metabolic Panel: Recent Labs    11/11/18 0854 11/11/18 0854 12/28/18 0910 03/03/19 0053 03/04/19 0339 09/28/19 0928  NA 140   < >  --  138 137 140  K 5.1   < >  --  3.9 4.2 4.0  CL 105   < >  --  106 107 106  CO2 29   < >  --  22 23 28   GLUCOSE 90   < >  --  139* 145* 86  BUN 10   < >  --  9 9 13   CREATININE 0.85  --   --  0.74 0.82 0.77  CALCIUM 9.2   < >  --  9.1 8.2* 9.0  TSH 0.37*  --  0.28*  --   --  2.03   < > = values in this interval not displayed.   Liver Function Tests: Recent Labs    11/11/18 0854 03/03/19 0053 09/28/19 0928  AST 13 19 15   ALT 8 13 9   ALKPHOS  --  57  --   BILITOT 0.6 1.0 0.8  PROT 7.1 7.5 6.8  ALBUMIN  --  4.3  --    Recent Labs    03/03/19 0053  LIPASE 29   No results for input(s): AMMONIA in the last 8760 hours. CBC: Recent Labs    11/11/18 0854  11/11/18 0854 03/03/19 0053 03/04/19 0339 09/28/19 0928  WBC 3.7*   < > 11.1* 11.3* 3.5*  NEUTROABS 1,968  --   --   --  1,820  HGB 14.3   < > 14.0 11.9* 13.8  HCT 43.5   < > 42.0 37.3 41.2  MCV 92.8   < > 93.8 96.4 93.4  PLT 223   < > 201 151 221   < > = values in this interval not displayed.   Lipid Panel: Recent Labs    11/11/18 0854 12/28/18 0910 09/28/19 0928  CHOL 225* 202* 201*  HDL 64 58 60  LDLCALC 133* 118* 120*  TRIG 152* 149 106  CHOLHDL 3.5 3.5 3.4    Assessment/Plan 1. Neuropathy -cont magnesium balm and ice for feet which help  2. Pure hypercholesterolemia - continue regular exercise with stationary bike and walking when weather permits - Lipid panel; Future  3. Acquired hypothyroidism -cont levothyroxine 98mcg daily which tsh is wnl  4. Primary osteoarthritis involving multiple joints -getting trigger finger left hand and has pain in both thumb joints at times  5. Osteopenia of multiple sites -f/u bone density in october  6. Slow transit constipation -continue fiber cereal and fiber one on top, hydration, exercise  7. History of breast cancer -for next mammogram  in October  Labs/tests ordered:   Lab Orders     Lipid panel  Next appt:  6 mos with flp before  Leena Tiede L. Ruhaan Nordahl, D.O. [facility] [address], [place] [address] Cell Phone (Mon-Fri 8am-5pm):  [phone] On Call:  [phone] & follow prompts after 5pm & weekends Office Phone:  [phone] Office Fax:  [phone]

## 2019-10-30 ENCOUNTER — Other Ambulatory Visit: Payer: Self-pay | Admitting: Internal Medicine

## 2019-10-30 DIAGNOSIS — E7800 Pure hypercholesterolemia, unspecified: Secondary | ICD-10-CM

## 2019-12-14 DIAGNOSIS — H35372 Puckering of macula, left eye: Secondary | ICD-10-CM | POA: Diagnosis not present

## 2020-01-24 ENCOUNTER — Encounter: Payer: Self-pay | Admitting: Internal Medicine

## 2020-02-07 ENCOUNTER — Encounter: Payer: Self-pay | Admitting: Internal Medicine

## 2020-02-07 DIAGNOSIS — Z23 Encounter for immunization: Secondary | ICD-10-CM | POA: Diagnosis not present

## 2020-02-10 ENCOUNTER — Encounter: Payer: Self-pay | Admitting: Nurse Practitioner

## 2020-02-10 DIAGNOSIS — Z23 Encounter for immunization: Secondary | ICD-10-CM | POA: Diagnosis not present

## 2020-02-22 DIAGNOSIS — H35372 Puckering of macula, left eye: Secondary | ICD-10-CM | POA: Diagnosis not present

## 2020-04-04 ENCOUNTER — Other Ambulatory Visit: Payer: Medicare Other

## 2020-04-04 ENCOUNTER — Other Ambulatory Visit: Payer: Self-pay

## 2020-04-04 DIAGNOSIS — E7800 Pure hypercholesterolemia, unspecified: Secondary | ICD-10-CM

## 2020-04-05 LAB — LIPID PANEL
Cholesterol: 199 mg/dL (ref <200)
HDL: 64 mg/dL (ref >=50)
LDL Cholesterol (Calc): 115 mg/dL (calc) — ABNORMAL HIGH
Non-HDL Cholesterol (Calc): 135 mg/dL (calc) — ABNORMAL HIGH (ref <130)
Total CHOL/HDL Ratio: 3.1 (calc) (ref <5.0)
Triglycerides: 95 mg/dL (ref <150)

## 2020-04-05 NOTE — Progress Notes (Signed)
Bad cholesterol has slightly improved.  We'll discuss at her visit.

## 2020-04-06 ENCOUNTER — Encounter: Payer: Self-pay | Admitting: Internal Medicine

## 2020-04-06 ENCOUNTER — Ambulatory Visit (INDEPENDENT_AMBULATORY_CARE_PROVIDER_SITE_OTHER): Payer: Medicare Other | Admitting: Internal Medicine

## 2020-04-06 ENCOUNTER — Other Ambulatory Visit: Payer: Self-pay

## 2020-04-06 VITALS — BP 118/78 | HR 72 | Temp 97.2°F | Ht 62.0 in | Wt 150.0 lb

## 2020-04-06 DIAGNOSIS — E7800 Pure hypercholesterolemia, unspecified: Secondary | ICD-10-CM | POA: Diagnosis not present

## 2020-04-06 DIAGNOSIS — R232 Flushing: Secondary | ICD-10-CM | POA: Diagnosis not present

## 2020-04-06 DIAGNOSIS — E039 Hypothyroidism, unspecified: Secondary | ICD-10-CM

## 2020-04-06 DIAGNOSIS — M8949 Other hypertrophic osteoarthropathy, multiple sites: Secondary | ICD-10-CM

## 2020-04-06 DIAGNOSIS — G629 Polyneuropathy, unspecified: Secondary | ICD-10-CM

## 2020-04-06 DIAGNOSIS — M8589 Other specified disorders of bone density and structure, multiple sites: Secondary | ICD-10-CM

## 2020-04-06 DIAGNOSIS — D72819 Decreased white blood cell count, unspecified: Secondary | ICD-10-CM

## 2020-04-06 DIAGNOSIS — M159 Polyosteoarthritis, unspecified: Secondary | ICD-10-CM

## 2020-04-06 MED ORDER — MELATONIN 3 MG PO TABS: 3.0000 mg | ORAL_TABLET | Freq: Every evening | ORAL | 3 refills | Status: DC | PRN

## 2020-04-06 NOTE — Progress Notes (Signed)
Location:  [facility] clinic Provider:  Forney Kleinpeter L. Mariea Clonts, D.O., C.M.D.  Code Status: DNR Goals of Care:  Advanced Directives 10/07/2019  Does Patient Have a Medical Advance Directive? Yes  Type of Advance Directive Out of facility DNR (pink MOST or yellow form)  Does patient want to make changes to medical advance directive? No - Patient declined  Copy of [place] in Chart? -  Would patient like information on creating a medical advance directive? -  Pre-existing out of facility DNR order (yellow form or pink MOST form) Pink MOST/Yellow Form most recent copy in chart - Physician notified to receive inpatient order   No chief complaint on file.   HPI: Patient is a 74 y.o. female seen today for medical management of chronic diseases.    Doing pretty well.  Has a lot of family stuff--brother passed away and has had to do the estate w/o a will and brother-in-law was in a car accident in [place] area.    Not getting a lot of walking in lately.  A couple of times only in the past few weeks.  Wants to start riding the bicycle--maybe come the new year.  Is not binge eating.    She is taking a melatonin 3mg  if her mind won't stop at night.  Does not take every night.  Does help her.    Hot flashes only first thing in the morning now.    Feet still burn.  Using an ice pack in bed.  Had been using a magnesium cream that only was effective for a couple of weeks.  Once she's asleep she's ok.  Cholesterol is slightly improved.  115 down from 120.    Is for mammogram tomorrow.  The bone density machine is broken so that is being rescheduled.  Her first [place] joints continue to bother her.  She does not want to get hooked on taking tylenol or ibuprofen.    Dried apricots every few days keep her bowels going.  Past Medical History:  Diagnosis Date  . Aphasia   . Arthritis    OA- fingers, toes, back pain, hip pain   . Breast cancer of upper-outer quadrant of left female breast  ([place])    left breast cancer  . Complication of anesthesia    slow to wake up  . Disorder of bone and cartilage, unspecified   . Diverticulitis   . Hypothyroid   . Lumbago   . Malignant neoplasm of breast (female), unspecified site   . Normal nuclear stress test    [facility], 07/18/2011  . Osteoarthritis (arthritis due to wear and tear of joints)   . Pain in joint, pelvic region and thigh   . Reflux esophagitis    pt. knowledgeable about dietary intake relative to reflux  . S/P bilateral breast biopsy 2013  . Shortness of breath    /w housework  . Symptomatic menopausal or female climacteric states     Past Surgical History:  Procedure Laterality Date  . APPENDECTOMY    . BREAST BIOPSY  07/26/2011   Procedure: BREAST BIOPSY WITH NEEDLE LOCALIZATION;  Surgeon: Rolm Bookbinder, MD;  Location: [facility];  Service: General;  Laterality: Right;  Right breast wire guided biospy Needle localization @ [facility]  7:30  . BREAST SURGERY     Procedure: BREAST BIOPSY WITH NEEDLE LOCALIZATION;  [place]  . CATARACT EXTRACTION  2012  . DILATION AND CURETTAGE OF UTERUS  1978  . ECTOPIC PREGNANCY SURGERY  1980  . EYE SURGERY     R cat. extracted /w IOL  . LAPAROSCOPIC APPENDECTOMY N/A 03/03/2019   Procedure: APPENDECTOMY LAPAROSCOPIC;  Surgeon: Armandina Gemma, MD;  Location: [facility];  Service: General;  Laterality: N/A;  . OVARIAN CYST SURGERY    . TONSILLECTOMY     as a child    Allergies  Allergen Reactions  . Benadryl [Diphenhydramine Hcl] Other (See Comments)    "Super hyper"  . Tape     Skin breakdown/redness   . Boniva [Ibandronate Sodium] Other (See Comments)    Neck, shoulder limited mobility   . Peridin-C [Ascorbic Acid] Diarrhea  . Venlafaxine Nausea Only  . Chlorhexidine Gluconate Itching  . Codeine Other (See Comments)    "can't wake up from it."  . Sulfa Antibiotics Rash    Patient unsure of reaction    Outpatient Encounter Medications as of 04/06/2020  Medication Sig  .  acetaminophen (TYLENOL) 500 MG tablet Take 2 tablets (1,000 mg total) by mouth every 8 (eight) hours as needed for mild pain.  . Cholecalciferol (VITAMIN D3) 25 MCG (1000 UT) CAPS Take by mouth.  . ezetimibe (ZETIA) 10 MG tablet TAKE 1 TABLET BY MOUTH EVERY DAY  . ibuprofen (ADVIL,MOTRIN) 100 MG tablet Take 400 mg by mouth every 6 (six) hours as needed for pain.   Marland Kitchen levothyroxine (SYNTHROID) 75 MCG tablet Take 1 tablet (75 mcg total) by mouth daily before breakfast.  . vitamin E 400 UNIT capsule Take 800 Units by mouth daily.    No facility-administered encounter medications on file as of 04/06/2020.    Review of Systems:  Review of Systems  Constitutional: Negative for chills, fever and malaise/fatigue.  HENT: Negative for congestion and sore throat.   Eyes: Negative for blurred vision.  Respiratory: Negative for cough and shortness of breath.   Cardiovascular: Negative for chest pain, palpitations and leg swelling.  Gastrointestinal: Positive for constipation. Negative for abdominal pain, blood in stool, diarrhea and melena.       Bowels moving with apricots  Genitourinary: Negative for dysuria.  Musculoskeletal: Positive for joint pain. Negative for falls.  Skin: Negative for itching and rash.  Neurological: Negative for dizziness, loss of consciousness and weakness.  Endo/Heme/Allergies: Does not bruise/bleed easily.  Psychiatric/Behavioral: Negative for depression and memory loss. The patient has insomnia. The patient is not nervous/anxious.     Health Maintenance  Topic Date Due  . COVID-19 Vaccine (4 - Booster for Pfizer series) 08/10/2020  . COLONOSCOPY  12/31/2022  . TETANUS/TDAP  04/16/2023  . INFLUENZA VACCINE  Completed  . DEXA SCAN  Completed  . Hepatitis C Screening  Completed  . PNA vac Low Risk Adult  Completed    Physical Exam: There were no vitals filed for this visit. There is no height or weight on file to calculate BMI. Physical Exam Vitals reviewed.   Constitutional:      General: She is not in acute distress.    Appearance: Normal appearance. She is not toxic-appearing.  HENT:     Head: Normocephalic and atraumatic.  Eyes:     Conjunctiva/sclera: Conjunctivae normal.     Comments: glasses  Cardiovascular:     Rate and Rhythm: Normal rate and regular rhythm.     Pulses: Normal pulses.     Heart sounds: Normal heart sounds.  Pulmonary:     Effort: Pulmonary effort is normal.     Breath sounds: Normal breath sounds. No wheezing, rhonchi or rales.  Abdominal:  General: Bowel sounds are normal.  Musculoskeletal:        General: Normal range of motion.     Right lower leg: No edema.     Left lower leg: No edema.     Comments: Tender over [place] joints bilaterally  Skin:    General: Skin is warm and dry.     Coloration: Skin is pale.  Neurological:     General: No focal deficit present.     Mental Status: She is alert and oriented to person, place, and time.     Cranial Nerves: No cranial nerve deficit.     Motor: No weakness.     Gait: Gait normal.  Psychiatric:        Mood and Affect: Mood normal.        Behavior: Behavior normal.        Thought Content: Thought content normal.        Judgment: Judgment normal.     Labs reviewed: Basic Metabolic Panel: Recent Labs    09/28/19 0928  NA 140  K 4.0  CL 106  CO2 28  GLUCOSE 86  BUN 13  CREATININE 0.77  CALCIUM 9.0  TSH 2.03   Liver Function Tests: Recent Labs    09/28/19 0928  AST 15  ALT 9  BILITOT 0.8  PROT 6.8   No results for input(s): LIPASE, AMYLASE in the last 8760 hours. No results for input(s): AMMONIA in the last 8760 hours. CBC: Recent Labs    09/28/19 0928  WBC 3.5*  NEUTROABS 1,820  HGB 13.8  HCT 41.2  MCV 93.4  PLT 221   Lipid Panel: Recent Labs    09/28/19 0928 04/04/20 0930  CHOL 201* 199  HDL 60 64  LDLCALC 120* 115*  TRIG 106 95  CHOLHDL 3.4 3.1    Assessment/Plan 1. Neuropathy -chronic post breast cancer  treatments -cont ice at night which helps her most  2. Pure hypercholesterolemia - cont zetia  - Lipid panel; Future  3. Acquired hypothyroidism - cont levothyroxine - COMPLETE METABOLIC PANEL WITH GFR; Future - CBC with Differential/Platelet; Future - TSH; Future  4. Primary osteoarthritis involving multiple joints -try to get back to cycling and walking, cont ibuprofen and tylenol on rare occasion  5. Osteopenia of multiple sites -cont vitamin D therapy and get back on track with weightbearing exercises - COMPLETE METABOLIC PANEL WITH GFR; Future  6. Hot flashes -only in am anymore which is major improvement  7. Leukopenia, unspecified type -trending down - CBC with Differential/Platelet; Future  Labs/tests ordered:   Orders Placed This Encounter  Procedures  . Lipid panel    Standing Status:   Future    Standing Expiration Date:   04/06/2021    Order Specific Question:   Has the patient fasted?    Answer:   Yes    Order Specific Question:   Release to patient    Answer:   Immediate  . COMPLETE METABOLIC PANEL WITH GFR    Standing Status:   Future    Standing Expiration Date:   04/06/2021    Order Specific Question:   Release to patient    Answer:   Immediate  . CBC with Differential/Platelet    Standing Status:   Future    Standing Expiration Date:   04/06/2021    Order Specific Question:   Release to patient    Answer:   Immediate  . TSH    Standing Status:   Future  Standing Expiration Date:   04/06/2021    Order Specific Question:   Release to patient    Answer:   Immediate   Next appt:  6 mos with fasting labs before  Abbigayle Toole L. Maye Parkinson, D.O. [facility] [address], [place] [address] Cell Phone (Mon-Fri 8am-5pm):  [phone] On Call:  [phone] & follow prompts after 5pm & weekends Office Phone:  [phone] Office Fax:  [phone]

## 2020-04-07 ENCOUNTER — Encounter: Payer: Self-pay | Admitting: Internal Medicine

## 2020-04-07 ENCOUNTER — Other Ambulatory Visit: Payer: Self-pay | Admitting: Internal Medicine

## 2020-04-07 DIAGNOSIS — E039 Hypothyroidism, unspecified: Secondary | ICD-10-CM

## 2020-04-07 LAB — HM MAMMOGRAPHY

## 2020-04-07 MED ORDER — LEVOTHYROXINE SODIUM 75 MCG PO TABS: 75.0000 ug | ORAL_TABLET | Freq: Every day | ORAL | 3 refills | Status: DC

## 2020-04-07 NOTE — Telephone Encounter (Signed)
Patient requested refill Sent to pharmacy.

## 2020-04-11 ENCOUNTER — Encounter: Payer: Self-pay | Admitting: *Deleted

## 2020-05-02 NOTE — Telephone Encounter (Signed)
Called patient to schedule AWV. Left voicemail.

## 2020-05-08 ENCOUNTER — Other Ambulatory Visit: Payer: Self-pay

## 2020-05-08 ENCOUNTER — Ambulatory Visit (INDEPENDENT_AMBULATORY_CARE_PROVIDER_SITE_OTHER): Payer: Medicare Other | Admitting: Nurse Practitioner

## 2020-05-08 ENCOUNTER — Encounter: Payer: Self-pay | Admitting: Nurse Practitioner

## 2020-05-08 VITALS — BP 124/76 | HR 73 | Temp 98.3°F | Ht 62.0 in | Wt 148.4 lb

## 2020-05-08 DIAGNOSIS — Z0000 Encounter for general adult medical examination without abnormal findings: Secondary | ICD-10-CM | POA: Diagnosis not present

## 2020-05-08 NOTE — Progress Notes (Signed)
Subjective:   Sylvia [name] is a 75 y.o. female who presents for Medicare Annual (Subsequent) preventive examination.  Review of Systems     Cardiac Risk Factors include: family history of premature cardiovascular disease;advanced age (>5men, >59 women)     Objective:    Today's Vitals   05/08/20 1307  BP: 124/76  Pulse: 73  Temp: 98.3 F (36.8 C)  SpO2: 98%  Weight: 148 lb 6.4 oz (67.3 kg)  Height: 5\' 2"  (1.575 m)   Body mass index is 27.14 kg/m.  Advanced Directives 05/08/2020 04/06/2020 10/07/2019 04/20/2019 03/29/2019 03/03/2019 03/02/2019  Does Patient Have a Medical Advance Directive? Yes Yes Yes Yes Yes - Yes  Type of Advance Directive [place];Living will [place];Living will Out of facility DNR (pink MOST or yellow form) [place];Living will Healthcare Power of [place]  Does patient want to make changes to medical advance directive? No - Patient declined No - Patient declined No - Patient declined No - Patient declined No - Patient declined - No - Patient declined  Copy of [place] in Chart? Yes - validated most recent copy scanned in chart (See row information) No - copy requested - Yes - validated most recent copy scanned in chart (See row information) - No - copy requested No - copy requested  Would patient like information on creating a medical advance directive? - - - - - - -  Pre-existing out of facility DNR order (yellow form or pink MOST form) - Pink MOST/Yellow Form most recent copy in chart - Physician notified to receive inpatient order Pink MOST/Yellow Form most recent copy in chart - Physician notified to receive inpatient order - - - -    Current Medications (verified) Outpatient Encounter Medications as of 05/08/2020  Medication Sig  . acetaminophen (TYLENOL) 500 MG tablet Take 2 tablets (1,000 mg total) by mouth  every 8 (eight) hours as needed for mild pain.  . Cholecalciferol (VITAMIN D3) 25 MCG (1000 UT) CAPS Take by mouth.  . ezetimibe (ZETIA) 10 MG tablet TAKE 1 TABLET BY MOUTH EVERY DAY  . ibuprofen (ADVIL,MOTRIN) 100 MG tablet Take 400 mg by mouth every 6 (six) hours as needed for pain.   Marland Kitchen levothyroxine (SYNTHROID) 75 MCG tablet Take 1 tablet (75 mcg total) by mouth daily before breakfast.  . melatonin 3 MG TABS tablet Take 1 tablet (3 mg total) by mouth at bedtime as needed (insomnia).  . vitamin E 400 UNIT capsule Take 800 Units by mouth daily.    No facility-administered encounter medications on file as of 05/08/2020.    Allergies (verified) Benadryl [diphenhydramine hcl], Tape, Boniva [ibandronate sodium], Peridin-c [ascorbic acid], Venlafaxine, Chlorhexidine gluconate, Codeine, and Sulfa antibiotics   History: Past Medical History:  Diagnosis Date  . Aphasia   . Arthritis    OA- fingers, toes, back pain, hip pain   . Breast cancer of upper-outer quadrant of left female breast ([place])    left breast cancer  . Complication of anesthesia    slow to wake up  . Disorder of bone and cartilage, unspecified   . Diverticulitis   . Hypothyroid   . Lumbago   . Malignant neoplasm of breast (female), unspecified site   . Normal nuclear stress test    [facility], 07/18/2011  . Osteoarthritis (arthritis due to wear and tear of joints)   . Pain in joint, pelvic region  and thigh   . Reflux esophagitis    pt. knowledgeable about dietary intake relative to reflux  . S/P bilateral breast biopsy 2013  . Shortness of breath    /w housework  . Symptomatic menopausal or female climacteric states    Past Surgical History:  Procedure Laterality Date  . APPENDECTOMY    . BREAST BIOPSY  07/26/2011   Procedure: BREAST BIOPSY WITH NEEDLE LOCALIZATION;  Surgeon: Emelia Loron, MD;  Location: [facility];  Service: General;  Laterality: Right;  Right breast wire guided biospy Needle localization @ [facility]   7:30  . BREAST SURGERY     Procedure: BREAST BIOPSY WITH NEEDLE LOCALIZATION;  [facility]  . CATARACT EXTRACTION  2012  . DILATION AND CURETTAGE OF UTERUS  1978  . ECTOPIC PREGNANCY SURGERY     1980  . EYE SURGERY     R cat. extracted /w IOL  . LAPAROSCOPIC APPENDECTOMY N/A 03/03/2019   Procedure: APPENDECTOMY LAPAROSCOPIC;  Surgeon: Darnell Level, MD;  Location[facility];  Service: General;  Laterality: N/A;  . OVARIAN CYST SURGERY    . TONSILLECTOMY     as a child   Family History  Problem Relation Age of Onset  . Cancer Sister        colon  . Cancer Brother        renal  . Heart disease Brother   . Cancer Mother        leukemia  . Peripheral vascular disease Mother   . Cancer Father        lung  . Heart attack Brother   . Cancer Maternal Aunt        breast  . Cancer Cousin   . Cancer Cousin   . Anesthesia problems Neg Hx    Social History   Socioeconomic History  . Marital status: Married    Spouse name: Not on file  . Number of children: Not on file  . Years of education: Not on file  . Highest education level: Not on file  Occupational History  . Not on file  Tobacco Use  . Smoking status: Former Smoker    Packs/day: 0.25    Types: Cigarettes    Quit date: 07/22/1980    Years since quitting: 39.8  . Smokeless tobacco: Never Used  Vaping Use  . Vaping Use: Never used  Substance and Sexual Activity  . Alcohol use: No  . Drug use: No  . Sexual activity: Not Currently    Birth control/protection: None  Other Topics Concern  . Not on file  Social History Narrative  . Not on file   Social Determinants of Health   Financial Resource Strain: Not on file  Food Insecurity: Not on file  Transportation Needs: Not on file  Physical Activity: Not on file  Stress: Not on file  Social Connections: Not on file    Tobacco Counseling Counseling given: Not Answered   Clinical Intake:     Pain : No/denies pain     BMI - recorded: 27 Nutritional Status: BMI 25  -29 Overweight Nutritional Risks: None Diabetes: No  How often do you need to have someone help you when you read instructions, pamphlets, or other written materials from your doctor or pharmacy?: 1 - Never  Diabetic?n         Activities of Daily Living In your present state of health, do you have any difficulty performing the following activities: 05/08/2020  Hearing? Y  Vision? N  Difficulty concentrating  or making decisions? Y  Comment problems with short term memory  Walking or climbing stairs? N  Dressing or bathing? N  Doing errands, shopping? N  Preparing Food and eating ? N  Using the Toilet? N  In the past six months, have you accidently leaked urine? N  Do you have problems with loss of bowel control? N  Managing your Medications? N  Managing your Finances? N  Housekeeping or managing your Housekeeping? N  Some recent data might be hidden    Patient Care Team: Gayland Curry, DO as PCP - General (Geriatric Medicine) Starlyn Skeans as Physician Assistant (Dermatology) Magrinat, Virgie Dad, MD as Consulting Physician (Oncology) Wilford Corner, MD as Consulting Physician (Gastroenterology)  Indicate any recent Medical Services you may have received from other than [facility] providers in the past year (date may be approximate).     Assessment:   This is a routine wellness examination for [facility].  Hearing/Vision screen  Hearing Screening   125Hz  250Hz  500Hz  1000Hz  2000Hz  3000Hz  4000Hz  6000Hz  8000Hz   Right ear:           Left ear:           Comments: Patient does not wear hearing aids.   Vision Screening Comments: Last vision exam was in October 2021  Dietary issues and exercise activities discussed: Current Exercise Habits: The patient does not participate in regular exercise at present  Goals    . Exercise     To ride bike every day or walk when nice - 30 mins/ 5days a week    . Patient Stated     To maintain current weight     . Weight  (lb) < 147 lb (66.7 kg)      Depression Screen PHQ 2/9 Scores 04/06/2020 04/20/2019 03/29/2019 11/16/2018 04/20/2018 04/14/2018 10/13/2017  PHQ - 2 Score 0 0 0 0 0 0 0  PHQ- 9 Score - - - - - - -    Fall Risk Fall Risk  05/08/2020 04/06/2020 04/20/2019 03/29/2019 11/16/2018  Falls in the past year? 0 0 0 0 0  Number falls in past yr: 0 0 0 - 0  Injury with Fall? - 0 0 0 0    FALL RISK PREVENTION PERTAINING TO THE HOME:  Any stairs in or around the home? Yes  If so, are there any without handrails? Yes  Home free of loose throw rugs in walkways, pet beds, electrical cords, etc? Yes  Adequate lighting in your home to reduce risk of falls? Yes   ASSISTIVE DEVICES UTILIZED TO PREVENT FALLS:  Life alert? No  Use of a cane, walker or w/c? No  Grab bars in the bathroom? Yes  Shower chair or bench in shower? Yes  Elevated toilet seat or a handicapped toilet? No   TIMED UP AND GO:  Was the test performed? No .   Gait slow and steady without use of assistive device  Cognitive Function: MMSE - Mini Mental State Exam 05/08/2020 04/14/2018 04/11/2017 04/11/2016 04/13/2015  Orientation to time 5 5 4 5 5   Orientation to Place 5 5 5 5 5   Registration 3 3 3 3 3   Attention/ Calculation 2 5 5 5 5   Recall 3 3 3 3 3   Language- name 2 objects 2 2 2 2 2   Language- repeat 1 1 1 1 1   Language- follow 3 step command 3 3 3 3 3   Language- read & follow direction 1 1 1 1  1  Write a sentence 1 1 1 1 1   Copy design 1 1 1 1 1   Total score 27 30 29 30 30      6CIT Screen 04/20/2019  What Year? 0 points  What month? 0 points  What time? 0 points  Count back from 20 0 points  Months in reverse 0 points  Repeat phrase 0 points  Total Score 0    Immunizations Immunization History  Administered Date(s) Administered  . Fluad Quad(high Dose 65+) 02/02/2019  . Influenza Whole 03/18/2014  . Influenza, High Dose Seasonal PF 02/10/2017, 02/09/2018, 02/07/2020  . Influenza, Seasonal, Injecte,  Preservative Fre 02/01/2016  . Influenza-Unspecified 02/05/2010, 03/30/2012, 04/02/2013, 02/21/2015, 02/07/2020  . PFIZER SARS-COV-2 Vaccination 05/18/2019, 06/07/2019, 02/10/2020  . Pneumococcal Conjugate-13 04/08/2014  . Pneumococcal Polysaccharide-23 09/28/2012  . Td 04/29/1978  . Tdap 04/15/2013  . Zoster 01/24/2014    TDAP status: Up to date  Flu Vaccine status: Up to date  Pneumococcal vaccine status: Up to date  Covid-19 vaccine status: Completed vaccines  Qualifies for Shingles Vaccine? Yes   Zostavax completed Yes   Shingrix Completed?: No.    Education has been provided regarding the importance of this vaccine. Patient has been advised to call insurance company to determine out of pocket expense if they have not yet received this vaccine. Advised may also receive vaccine at local pharmacy or Health Dept. Verbalized acceptance and understanding.  Screening Tests Health Maintenance  Topic Date Due  . COVID-19 Vaccine (4 - Booster for Pfizer series) 08/10/2020  . COLONOSCOPY (Pts 45-11yrs Insurance coverage will need to be confirmed)  12/31/2022  . TETANUS/TDAP  04/16/2023  . INFLUENZA VACCINE  Completed  . DEXA SCAN  Completed  . Hepatitis C Screening  Completed  . PNA vac Low Risk Adult  Completed    Health Maintenance  There are no preventive care reminders to display for this patient.  Colorectal cancer screening: Type of screening: Colonoscopy. Completed 2019. Repeat every 5 years  Mammogram status: Completed 04/07/2020. Repeat every year  Bone Density status: Ordered plans to schedule. Pt provided with contact info and advised to call to schedule appt.  Lung Cancer Screening: (Low Dose CT Chest recommended if Age 1-80 years, 30 pack-year currently smoking OR have quit w/in 15years.) does not qualify.   Lung Cancer Screening Referral: na  Additional Screening:  Hepatitis C Screening: does qualify; Completed 2017  Vision Screening: Recommended annual  ophthalmology exams for early detection of glaucoma and other disorders of the eye. Is the patient up to date with their annual eye exam?  Yes  Who is the provider or what is the name of the office in which the patient attends annual eye exams? Dr 2020, [facility] If pt is not established with a provider, would they like to be referred to a provider to establish care? No .   Dental Screening: Recommended annual dental exams for proper oral hygiene  Community Resource Referral / Chronic Care Management: CRR required this visit?  No   CCM required this visit?  No      Plan:     I have personally reviewed and noted the following in the patient's chart:   . Medical and social history . Use of alcohol, tobacco or illicit drugs  . Current medications and supplements . Functional ability and status . Nutritional status . Physical activity . Advanced directives . List of other physicians . Hospitalizations, surgeries, and ER visits in previous 12 months . Vitals . Screenings  to include cognitive, depression, and falls . Referrals and appointments  In addition, I have reviewed and discussed with patient certain preventive protocols, quality metrics, and best practice recommendations. A written personalized care plan for preventive services as well as general preventive health recommendations were provided to patient.     Lauree Chandler, NP   05/08/2020

## 2020-05-08 NOTE — Patient Instructions (Signed)
Sylvia [name] , Thank you for taking time to come for your Medicare Wellness Visit. I appreciate your ongoing commitment to your health goals. Please review the following plan we discussed and let me know if I can assist you in the future.   Screening recommendations/referrals: Colonoscopy up to date Mammogram up to date Bone Density scheduled Recommended yearly ophthalmology/optometry visit for glaucoma screening and checkup Recommended yearly dental visit for hygiene and checkup  Vaccinations: Influenza vaccine up to date Pneumococcal vaccine up to date Tdap vaccine up to date Shingles vaccine RECOMMEND- to get shingrix at local pharmacy     Advanced directives: on file  Conditions/risks identified: advanced age  Next appointment: 1 year    Preventive Care 76 Years and Older, Female Preventive care refers to lifestyle choices and visits with your health care provider that can promote health and wellness. What does preventive care include?  A yearly physical exam. This is also called an annual well check.  Dental exams once or twice a year.  Routine eye exams. Ask your health care provider how often you should have your eyes checked.  Personal lifestyle choices, including:  Daily care of your teeth and gums.  Regular physical activity.  Eating a healthy diet.  Avoiding tobacco and drug use.  Limiting alcohol use.  Practicing safe sex.  Taking low-dose aspirin every day.  Taking vitamin and mineral supplements as recommended by your health care provider. What happens during an annual well check? The services and screenings done by your health care provider during your annual well check will depend on your age, overall health, lifestyle risk factors, and family history of disease. Counseling  Your health care provider may ask you questions about your:  Alcohol use.  Tobacco use.  Drug use.  Emotional well-being.  Home and relationship well-being.  Sexual  activity.  Eating habits.  History of falls.  Memory and ability to understand (cognition).  Work and work Statistician.  Reproductive health. Screening  You may have the following tests or measurements:  Height, weight, and BMI.  Blood pressure.  Lipid and cholesterol levels. These may be checked every 5 years, or more frequently if you are over 72 years old.  Skin check.  Lung cancer screening. You may have this screening every year starting at age 40 if you have a 30-pack-year history of smoking and currently smoke or have quit within the past 15 years.  Fecal occult blood test (FOBT) of the stool. You may have this test every year starting at age 89.  Flexible sigmoidoscopy or colonoscopy. You may have a sigmoidoscopy every 5 years or a colonoscopy every 10 years starting at age 74.  Hepatitis C blood test.  Hepatitis B blood test.  Sexually transmitted disease (STD) testing.  Diabetes screening. This is done by checking your blood sugar (glucose) after you have not eaten for a while (fasting). You may have this done every 1-3 years.  Bone density scan. This is done to screen for osteoporosis. You may have this done starting at age 22.  Mammogram. This may be done every 1-2 years. Talk to your health care provider about how often you should have regular mammograms. Talk with your health care provider about your test results, treatment options, and if necessary, the need for more tests. Vaccines  Your health care provider may recommend certain vaccines, such as:  Influenza vaccine. This is recommended every year.  Tetanus, diphtheria, and acellular pertussis (Tdap, Td) vaccine. You may need a Td  booster every 10 years.  Zoster vaccine. You may need this after age 19.  Pneumococcal 13-valent conjugate (PCV13) vaccine. One dose is recommended after age 38.  Pneumococcal polysaccharide (PPSV23) vaccine. One dose is recommended after age 55. Talk to your health care  provider about which screenings and vaccines you need and how often you need them. This information is not intended to replace advice given to you by your health care provider. Make sure you discuss any questions you have with your health care provider. Document Released: 05/12/2015 Document Revised: 01/03/2016 Document Reviewed: 02/14/2015 Elsevier Interactive Patient Education  2017 [place] Prevention in the Home Falls can cause injuries. They can happen to people of all ages. There are many things you can do to make your home safe and to help prevent falls. What can I do on the outside of my home?  Regularly fix the edges of walkways and driveways and fix any cracks.  Remove anything that might make you trip as you walk through a door, such as a raised step or threshold.  Trim any bushes or trees on the path to your home.  Use bright outdoor lighting.  Clear any walking paths of anything that might make someone trip, such as rocks or tools.  Regularly check to see if handrails are loose or broken. Make sure that both sides of any steps have handrails.  Any raised decks and porches should have guardrails on the edges.  Have any leaves, snow, or ice cleared regularly.  Use sand or salt on walking paths during winter.  Clean up any spills in your garage right away. This includes oil or grease spills. What can I do in the bathroom?  Use night lights.  Install grab bars by the toilet and in the tub and shower. Do not use towel bars as grab bars.  Use non-skid mats or decals in the tub or shower.  If you need to sit down in the shower, use a plastic, non-slip stool.  Keep the floor dry. Clean up any water that spills on the floor as soon as it happens.  Remove soap buildup in the tub or shower regularly.  Attach bath mats securely with double-sided non-slip rug tape.  Do not have throw rugs and other things on the floor that can make you trip. What can I do in  the bedroom?  Use night lights.  Make sure that you have a light by your bed that is easy to reach.  Do not use any sheets or blankets that are too big for your bed. They should not hang down onto the floor.  Have a firm chair that has side arms. You can use this for support while you get dressed.  Do not have throw rugs and other things on the floor that can make you trip. What can I do in the kitchen?  Clean up any spills right away.  Avoid walking on wet floors.  Keep items that you use a lot in easy-to-reach places.  If you need to reach something above you, use a strong step stool that has a grab bar.  Keep electrical cords out of the way.  Do not use floor polish or wax that makes floors slippery. If you must use wax, use non-skid floor wax.  Do not have throw rugs and other things on the floor that can make you trip. What can I do with my stairs?  Do not leave any items on the stairs.  Make sure that there are handrails on both sides of the stairs and use them. Fix handrails that are broken or loose. Make sure that handrails are as long as the stairways.  Check any carpeting to make sure that it is firmly attached to the stairs. Fix any carpet that is loose or worn.  Avoid having throw rugs at the top or bottom of the stairs. If you do have throw rugs, attach them to the floor with carpet tape.  Make sure that you have a light switch at the top of the stairs and the bottom of the stairs. If you do not have them, ask someone to add them for you. What else can I do to help prevent falls?  Wear shoes that:  Do not have high heels.  Have rubber bottoms.  Are comfortable and fit you well.  Are closed at the toe. Do not wear sandals.  If you use a stepladder:  Make sure that it is fully opened. Do not climb a closed stepladder.  Make sure that both sides of the stepladder are locked into place.  Ask someone to hold it for you, if possible.  Clearly mark and  make sure that you can see:  Any grab bars or handrails.  First and last steps.  Where the edge of each step is.  Use tools that help you move around (mobility aids) if they are needed. These include:  Canes.  Walkers.  Scooters.  Crutches.  Turn on the lights when you go into a dark area. Replace any light bulbs as soon as they burn out.  Set up your furniture so you have a clear path. Avoid moving your furniture around.  If any of your floors are uneven, fix them.  If there are any pets around you, be aware of where they are.  Review your medicines with your doctor. Some medicines can make you feel dizzy. This can increase your chance of falling. Ask your doctor what other things that you can do to help prevent falls. This information is not intended to replace advice given to you by your health care provider. Make sure you discuss any questions you have with your health care provider. Document Released: 02/09/2009 Document Revised: 09/21/2015 Document Reviewed: 05/20/2014 Elsevier Interactive Patient Education  2017 [employer].

## 2020-06-19 ENCOUNTER — Encounter: Payer: Self-pay | Admitting: Internal Medicine

## 2020-06-30 ENCOUNTER — Encounter: Payer: Self-pay | Admitting: Internal Medicine

## 2020-06-30 DIAGNOSIS — M8589 Other specified disorders of bone density and structure, multiple sites: Secondary | ICD-10-CM

## 2020-06-30 DIAGNOSIS — E039 Hypothyroidism, unspecified: Secondary | ICD-10-CM

## 2020-06-30 DIAGNOSIS — Z853 Personal history of malignant neoplasm of breast: Secondary | ICD-10-CM

## 2020-07-10 DIAGNOSIS — M8589 Other specified disorders of bone density and structure, multiple sites: Secondary | ICD-10-CM | POA: Diagnosis not present

## 2020-07-10 DIAGNOSIS — Z780 Asymptomatic menopausal state: Secondary | ICD-10-CM | POA: Diagnosis not present

## 2020-07-10 LAB — HM DEXA SCAN

## 2020-07-11 ENCOUNTER — Encounter: Payer: Self-pay | Admitting: *Deleted

## 2020-07-28 ENCOUNTER — Telehealth: Payer: Self-pay

## 2020-07-28 NOTE — Telephone Encounter (Signed)
-----   Message from Lauree Chandler, NP sent at 07/13/2020  1:19 PM EDT ----- Please let pt know that osteopenia was noted, to continue current treatment with weight bearing exercises  ----- Message ----- From: May, Anita A, CMA Sent: 07/11/2020   9:32 AM EDT To: Lauree Chandler, NP  Abstracted to patient's chart.

## 2020-07-28 NOTE — Telephone Encounter (Signed)
Discussed results with patient, patient verbalized understanding of results. Patient states she had viewed results via mychart. Patient started calcium 1260 mg daily and hopes that is will not cause any bowel issues.

## 2020-08-16 ENCOUNTER — Encounter: Payer: Self-pay | Admitting: Nurse Practitioner

## 2020-08-18 ENCOUNTER — Ambulatory Visit (INDEPENDENT_AMBULATORY_CARE_PROVIDER_SITE_OTHER): Payer: Medicare Other | Admitting: Nurse Practitioner

## 2020-08-18 ENCOUNTER — Encounter: Payer: Self-pay | Admitting: Nurse Practitioner

## 2020-08-18 ENCOUNTER — Other Ambulatory Visit: Payer: Self-pay

## 2020-08-18 VITALS — BP 122/70 | HR 77 | Temp 97.9°F | Ht 62.0 in | Wt 151.0 lb

## 2020-08-18 DIAGNOSIS — M25561 Pain in right knee: Secondary | ICD-10-CM

## 2020-08-18 NOTE — Progress Notes (Signed)
Careteam: Patient Care Team: Lauree Chandler, NP as PCP - General (Geriatric Medicine) Warren Danes, PA-C as Physician Assistant (Dermatology) Magrinat, Virgie Dad, MD as Consulting Physician (Oncology) Wilford Corner, MD as Consulting Physician (Gastroenterology)  PLACE OF SERVICE:  [facility]  Advanced Directive information    Allergies  Allergen Reactions  . Benadryl [Diphenhydramine Hcl] Other (See Comments)    "Super hyper"  . Tape     Skin breakdown/redness   . Boniva [Ibandronate Sodium] Other (See Comments)    Neck, shoulder limited mobility   . Peridin-C [Ascorbic Acid] Diarrhea  . Venlafaxine Nausea Only  . Chlorhexidine Gluconate Itching  . Codeine Other (See Comments)    "can't wake up from it."  . Sulfa Antibiotics Rash    Patient unsure of reaction    Chief Complaint  Patient presents with  . Acute Visit    RIght knee pain x 2 weeks      HPI: Patient is a 75 y.o. female here today for pain x 2 week.  Reports she has been putting salon pas on it over the last few days. Also using advil.  Going through files on the floor and then the next day had increase sharp pain to the top of the knee and on both side. Over the course of the last 2 weeks has been getting better but not where it should be.  Did not twist or pop knee.  Reports some burning sensation on both knee caps for the past 2 months.  Walks and does steps for exercise.  Not able to walk due to pain.  No pain at this time but when the pain hit 7/10. Would be a sharp pain and then disappear. Pain comes and goes. No pain in 4 days Reports knee feels weak now with cracking.  No redness, swelling or warmth to knee Review of Systems:  Review of Systems  Constitutional: Negative for chills and fever.  Musculoskeletal: Positive for joint pain (right knee).    Past Medical History:  Diagnosis Date  . Aphasia   . Arthritis    OA- fingers, toes, back pain, hip pain   . Breast cancer  of upper-outer quadrant of left female breast ([place])    left breast cancer  . Complication of anesthesia    slow to wake up  . Disorder of bone and cartilage, unspecified   . Diverticulitis   . Hypothyroid   . Lumbago   . Malignant neoplasm of breast (female), unspecified site   . Normal nuclear stress test    [facility], 07/18/2011  . Osteoarthritis (arthritis due to wear and tear of joints)   . Pain in joint, pelvic region and thigh   . Reflux esophagitis    pt. knowledgeable about dietary intake relative to reflux  . S/P bilateral breast biopsy 2013  . Shortness of breath    /w housework  . Symptomatic menopausal or female climacteric states    Past Surgical History:  Procedure Laterality Date  . APPENDECTOMY    . BREAST BIOPSY  07/26/2011   Procedure: BREAST BIOPSY WITH NEEDLE LOCALIZATION;  Surgeon: Rolm Bookbinder, MD;  Location: [facility];  Service: General;  Laterality: Right;  Right breast wire guided biospy Needle localization @ [facility]  7:30  . BREAST SURGERY     Procedure: BREAST BIOPSY WITH NEEDLE LOCALIZATION;  [place]  . CATARACT EXTRACTION  2012  . DILATION AND CURETTAGE OF UTERUS  1978  . ECTOPIC PREGNANCY SURGERY  1980  . EYE SURGERY     R cat. extracted /w IOL  . LAPAROSCOPIC APPENDECTOMY N/A 03/03/2019   Procedure: APPENDECTOMY LAPAROSCOPIC;  Surgeon: Armandina Gemma, MD;  Location: [facility];  Service: General;  Laterality: N/A;  . OVARIAN CYST SURGERY    . TONSILLECTOMY     as a child   Social History:   reports that she quit smoking about 40 years ago. Her smoking use included cigarettes. She smoked 0.25 packs per day. She has never used smokeless tobacco. She reports that she does not drink alcohol and does not use drugs.  Family History  Problem Relation Age of Onset  . Cancer Sister        colon  . Cancer Brother        renal  . Heart disease Brother   . Cancer Mother        leukemia  . Peripheral vascular disease Mother   . Cancer Father        lung   . Heart attack Brother   . Cancer Maternal Aunt        breast  . Cancer Cousin   . Cancer Cousin   . Anesthesia problems Neg Hx     Medications: Patient's Medications  New Prescriptions   No medications on file  Previous Medications   ACETAMINOPHEN (TYLENOL) 500 MG TABLET    Take 2 tablets (1,000 mg total) by mouth every 8 (eight) hours as needed for mild pain.   CALCIUM CITRATE PO    Take 2 tablets by mouth in the morning and at bedtime. Totaling 1260 mg daily   CHOLECALCIFEROL (VITAMIN D3) 25 MCG (1000 UT) CAPS    Take by mouth.   EZETIMIBE (ZETIA) 10 MG TABLET    TAKE 1 TABLET BY MOUTH EVERY DAY   IBUPROFEN (ADVIL,MOTRIN) 100 MG TABLET    Take 400 mg by mouth every 6 (six) hours as needed for pain.    LEVOTHYROXINE (SYNTHROID) 75 MCG TABLET    Take 1 tablet (75 mcg total) by mouth daily before breakfast.   MELATONIN 3 MG TABS TABLET    Take 1 tablet (3 mg total) by mouth at bedtime as needed (insomnia).   VITAMIN E 400 UNIT CAPSULE    Take 800 Units by mouth daily.   Modified Medications   No medications on file  Discontinued Medications   CALCIUM PO    Take 1,230 mg by mouth daily.    Physical Exam:  Vitals:   08/18/20 1133  BP: 122/70  Pulse: 77  Temp: 97.9 F (36.6 C)  TempSrc: Temporal  SpO2: 97%  Weight: 151 lb (68.5 kg)  Height: 5\' 2"  (1.575 m)   Body mass index is 27.62 kg/m. Wt Readings from Last 3 Encounters:  08/18/20 151 lb (68.5 kg)  05/08/20 148 lb 6.4 oz (67.3 kg)  04/06/20 150 lb (68 kg)    Physical Exam Constitutional:      Appearance: Normal appearance.  Musculoskeletal:        General: Normal range of motion.     Right knee: Crepitus present. No swelling or deformity. Normal range of motion. No tenderness. No LCL laxity, MCL laxity, ACL laxity or PCL laxity. Normal pulse.     Instability Tests: Anterior drawer test negative. Posterior drawer test negative. Anterior Lachman test negative. Medial McMurray test negative and lateral McMurray  test negative.     Left knee: No swelling or deformity. Normal range of motion. No tenderness.  Right lower leg: No edema.     Left lower leg: No edema.  Skin:    General: Skin is warm and dry.  Neurological:     Mental Status: She is alert and oriented to person, place, and time.  Psychiatric:        Mood and Affect: Mood normal.        Behavior: Behavior normal.     Labs reviewed: Basic Metabolic Panel: Recent Labs    09/28/19 0928  NA 140  K 4.0  CL 106  CO2 28  GLUCOSE 86  BUN 13  CREATININE 0.77  CALCIUM 9.0  TSH 2.03   Liver Function Tests: Recent Labs    09/28/19 0928  AST 15  ALT 9  BILITOT 0.8  PROT 6.8   No results for input(s): LIPASE, AMYLASE in the last 8760 hours. No results for input(s): AMMONIA in the last 8760 hours. CBC: Recent Labs    09/28/19 0928  WBC 3.5*  NEUTROABS 1,820  HGB 13.8  HCT 41.2  MCV 93.4  PLT 221   Lipid Panel: Recent Labs    09/28/19 0928 04/04/20 0930  CHOL 201* 199  HDL 60 64  LDLCALC 120* 115*  TRIG 106 95  CHOLHDL 3.4 3.1   TSH: Recent Labs    09/28/19 0928  TSH 2.03   A1C: No results found for: HGBA1C   Assessment/Plan 1. Acute pain of right knee Suspect flare due to arthritis with positional strain, now improved at this time on ibuprofen, salon pas and rest To continue salon pas Okay to get back to walking and strength training of legs, exercises given- not to work through pain Ice after you walk ~20 mins.  Okay to use tylenol as needed pain.   Next appt: 10/11/2020, sooner if needed  Carlos American. [place], [facility] [phone]

## 2020-08-18 NOTE — Patient Instructions (Addendum)
To continue salon pas Okay to get back to walking Ice after you walk ~20 mins.  Okay to use tylenol as needed pain.   Journal for Nurse Practitioners, 15(4), [phone]. Retrieved February 02, 2018 from http://clinicalkey.com/nursing">  Knee Exercises Ask your health care provider which exercises are safe for you. Do exercises exactly as told by your health care provider and adjust them as directed. It is normal to feel mild stretching, pulling, tightness, or discomfort as you do these exercises. Stop right away if you feel sudden pain or your pain gets worse. Do not begin these exercises until told by your health care provider. Stretching and range-of-motion exercises These exercises warm up your muscles and joints and improve the movement and flexibility of your knee. These exercises also help to relieve pain and swelling. Knee extension, prone 1. Lie on your abdomen (prone position) on a bed. 2. Place your left / right knee just beyond the edge of the surface so your knee is not on the bed. You can put a towel under your left / right thigh just above your kneecap for comfort. 3. Relax your leg muscles and allow gravity to straighten your knee (extension). You should feel a stretch behind your left / right knee. 4. Hold this position for __________ seconds. 5. Scoot up so your knee is supported between repetitions. Repeat __________ times. Complete this exercise __________ times a day. Knee flexion, active 1. Lie on your back with both legs straight. If this causes back discomfort, bend your left / right knee so your foot is flat on the floor. 2. Slowly slide your left / right heel back toward your buttocks. Stop when you feel a gentle stretch in the front of your knee or thigh (flexion). 3. Hold this position for __________ seconds. 4. Slowly slide your left / right heel back to the starting position. Repeat __________ times. Complete this exercise __________ times a day.   Quadriceps stretch,  prone 1. Lie on your abdomen on a firm surface, such as a bed or padded floor. 2. Bend your left / right knee and hold your ankle. If you cannot reach your ankle or pant leg, loop a belt around your foot and grab the belt instead. 3. Gently pull your heel toward your buttocks. Your knee should not slide out to the side. You should feel a stretch in the front of your thigh and knee (quadriceps). 4. Hold this position for __________ seconds. Repeat __________ times. Complete this exercise __________ times a day.   Hamstring, supine 1. Lie on your back (supine position). 2. Loop a belt or towel over the ball of your left / right foot. The ball of your foot is on the walking surface, right under your toes. 3. Straighten your left / right knee and slowly pull on the belt to raise your leg until you feel a gentle stretch behind your knee (hamstring). ? Do not let your knee bend while you do this. ? Keep your other leg flat on the floor. 4. Hold this position for __________ seconds. Repeat __________ times. Complete this exercise __________ times a day. Strengthening exercises These exercises build strength and endurance in your knee. Endurance is the ability to use your muscles for a long time, even after they get tired. Quadriceps, isometric This exercise stretches the muscles in front of your thigh (quadriceps) without moving your knee joint (isometric). 1. Lie on your back with your left / right leg extended and your other knee bent. Put  a rolled towel or small pillow under your knee if told by your health care provider. 2. Slowly tense the muscles in the front of your left / right thigh. You should see your kneecap slide up toward your hip or see increased dimpling just above the knee. This motion will push the back of the knee toward the floor. 3. For __________ seconds, hold the muscle as tight as you can without increasing your pain. 4. Relax the muscles slowly and completely. Repeat  __________ times. Complete this exercise __________ times a day.   Straight leg raises This exercise stretches the muscles in front of your thigh (quadriceps) and the muscles that move your hips (hip flexors). 1. Lie on your back with your left / right leg extended and your other knee bent. 2. Tense the muscles in the front of your left / right thigh. You should see your kneecap slide up or see increased dimpling just above the knee. Your thigh may even shake a bit. 3. Keep these muscles tight as you raise your leg 4-6 inches (10-15 cm) off the floor. Do not let your knee bend. 4. Hold this position for __________ seconds. 5. Keep these muscles tense as you lower your leg. 6. Relax your muscles slowly and completely after each repetition. Repeat __________ times. Complete this exercise __________ times a day. Hamstring, isometric 1. Lie on your back on a firm surface. 2. Bend your left / right knee about __________ degrees. 3. Dig your left / right heel into the surface as if you are trying to pull it toward your buttocks. Tighten the muscles in the back of your thighs (hamstring) to "dig" as hard as you can without increasing any pain. 4. Hold this position for __________ seconds. 5. Release the tension gradually and allow your muscles to relax completely for __________ seconds after each repetition. Repeat __________ times. Complete this exercise __________ times a day. Hamstring curls If told by your health care provider, do this exercise while wearing ankle weights. Begin with __________ lb weights. Then increase the weight by 1 lb (0.5 kg) increments. Do not wear ankle weights that are more than __________ lb. 1. Lie on your abdomen with your legs straight. 2. Bend your left / right knee as far as you can without feeling pain. Keep your hips flat against the floor. 3. Hold this position for __________ seconds. 4. Slowly lower your leg to the starting position. Repeat __________ times.  Complete this exercise __________ times a day.   Squats This exercise strengthens the muscles in front of your thigh and knee (quadriceps). 1. Stand in front of a table, with your feet and knees pointing straight ahead. You may rest your hands on the table for balance but not for support. 2. Slowly bend your knees and lower your hips like you are going to sit in a chair. ? Keep your weight over your heels, not over your toes. ? Keep your lower legs upright so they are parallel with the table legs. ? Do not let your hips go lower than your knees. ? Do not bend lower than told by your health care provider. ? If your knee pain increases, do not bend as low. 3. Hold the squat position for __________ seconds. 4. Slowly push with your legs to return to standing. Do not use your hands to pull yourself to standing. Repeat __________ times. Complete this exercise __________ times a day. Wall slides This exercise strengthens the muscles in front of your  thigh and knee (quadriceps). 1. Lean your back against a smooth wall or door, and walk your feet out 18-24 inches (46-61 cm) from it. 2. Place your feet hip-width apart. 3. Slowly slide down the wall or door until your knees bend __________ degrees. Keep your knees over your heels, not over your toes. Keep your knees in line with your hips. 4. Hold this position for __________ seconds. Repeat __________ times. Complete this exercise __________ times a day.   Straight leg raises This exercise strengthens the muscles that rotate the leg at the hip and move it away from your body (hip abductors). 1. Lie on your side with your left / right leg in the top position. Lie so your head, shoulder, knee, and hip line up. You may bend your bottom knee to help you keep your balance. 2. Roll your hips slightly forward so your hips are stacked directly over each other and your left / right knee is facing forward. 3. Leading with your heel, lift your top leg 4-6 inches  (10-15 cm). You should feel the muscles in your outer hip lifting. ? Do not let your foot drift forward. ? Do not let your knee roll toward the ceiling. 4. Hold this position for __________ seconds. 5. Slowly return your leg to the starting position. 6. Let your muscles relax completely after each repetition. Repeat __________ times. Complete this exercise __________ times a day.   Straight leg raises This exercise stretches the muscles that move your hips away from the front of the pelvis (hip extensors). 1. Lie on your abdomen on a firm surface. You can put a pillow under your hips if that is more comfortable. 2. Tense the muscles in your buttocks and lift your left / right leg about 4-6 inches (10-15 cm). Keep your knee straight as you lift your leg. 3. Hold this position for __________ seconds. 4. Slowly lower your leg to the starting position. 5. Let your leg relax completely after each repetition. Repeat __________ times. Complete this exercise __________ times a day. This information is not intended to replace advice given to you by your health care provider. Make sure you discuss any questions you have with your health care provider. Document Revised: 02/03/2018 Document Reviewed: 02/03/2018 Elsevier Patient Education  2021 [employer].

## 2020-08-28 ENCOUNTER — Encounter: Payer: Self-pay | Admitting: Nurse Practitioner

## 2020-08-28 DIAGNOSIS — M25561 Pain in right knee: Secondary | ICD-10-CM

## 2020-08-29 ENCOUNTER — Encounter: Payer: Self-pay | Admitting: Nurse Practitioner

## 2020-09-07 DIAGNOSIS — M25561 Pain in right knee: Secondary | ICD-10-CM | POA: Diagnosis not present

## 2020-10-11 ENCOUNTER — Ambulatory Visit (INDEPENDENT_AMBULATORY_CARE_PROVIDER_SITE_OTHER): Payer: Medicare Other | Admitting: Nurse Practitioner

## 2020-10-11 ENCOUNTER — Encounter: Payer: Self-pay | Admitting: Nurse Practitioner

## 2020-10-11 ENCOUNTER — Other Ambulatory Visit: Payer: Self-pay

## 2020-10-11 VITALS — BP 118/77 | HR 72 | Temp 97.6°F | Ht 62.0 in | Wt 148.0 lb

## 2020-10-11 DIAGNOSIS — M8589 Other specified disorders of bone density and structure, multiple sites: Secondary | ICD-10-CM

## 2020-10-11 DIAGNOSIS — M8949 Other hypertrophic osteoarthropathy, multiple sites: Secondary | ICD-10-CM | POA: Diagnosis not present

## 2020-10-11 DIAGNOSIS — E7800 Pure hypercholesterolemia, unspecified: Secondary | ICD-10-CM

## 2020-10-11 DIAGNOSIS — K5901 Slow transit constipation: Secondary | ICD-10-CM | POA: Diagnosis not present

## 2020-10-11 DIAGNOSIS — F419 Anxiety disorder, unspecified: Secondary | ICD-10-CM | POA: Diagnosis not present

## 2020-10-11 DIAGNOSIS — M159 Polyosteoarthritis, unspecified: Secondary | ICD-10-CM

## 2020-10-11 DIAGNOSIS — E039 Hypothyroidism, unspecified: Secondary | ICD-10-CM | POA: Diagnosis not present

## 2020-10-11 NOTE — Progress Notes (Signed)
Careteam: Patient Care Team: Lauree Chandler, NP as PCP - General (Geriatric Medicine) Warren Danes, PA-C as Physician Assistant (Dermatology) Magrinat, Virgie Dad, MD as Consulting Physician (Oncology) Wilford Corner, MD as Consulting Physician (Gastroenterology)  PLACE OF SERVICE:  [facility] Directive information Does Patient Have a Medical Advance Directive?: Yes, Type of Advance Directive: [place];Living will, Does patient want to make changes to medical advance directive?: No - Patient declined  Allergies  Allergen Reactions   Benadryl [Diphenhydramine Hcl] Other (See Comments)    "Super hyper"   Tape     Skin breakdown/redness    Boniva [Ibandronate Sodium] Other (See Comments)    Neck, shoulder limited mobility    Peridin-C [Ascorbic Acid] Diarrhea   Venlafaxine Nausea Only   Chlorhexidine Gluconate Itching   Codeine Other (See Comments)    "can't wake up from it."   Sulfa Antibiotics Rash    Patient unsure of reaction    Chief Complaint  Patient presents with   Medical Management of Chronic Issues    6 month follow up.    Health Maintenance    Zoster vaccine and 2nd COVID booster     HPI: Patient is a 75 y.o. female for routine follow up   Hyperlipidemia- on zetia   Constipation- managed with diet (dried apricots)   Hypothyroid- on synthroid.   OA right knee- got injection from Dr Veverly Fells, doing better at this time.   Husband is in the "final stages of cancer" and she is having to be his caregiver. He was originally given 6 months in 2016. In 2020 prostate cancer worsened and now has mets. Planning on starting radiation for pain management.  Lifting him and caring for him.   Osteopenia- continues on cal and vit d  Insomnia- she has issues due to having to care for her husband. Goes to bed easily but up throughout the night with her husband. Takes nap during the day.   Concerned about anxiety in the upcoming  weeks she had previously been given PRN medication but made her very sleepy  Review of Systems:  Review of Systems  Constitutional:  Negative for chills, fever and weight loss.  HENT:  Negative for tinnitus.   Respiratory:  Negative for cough, sputum production and shortness of breath.   Cardiovascular:  Negative for chest pain, palpitations and leg swelling.  Gastrointestinal:  Negative for abdominal pain, constipation, diarrhea and heartburn.  Genitourinary:  Negative for dysuria, frequency and urgency.  Musculoskeletal:  Negative for back pain, falls, joint pain and myalgias.  Skin: Negative.   Neurological:  Negative for dizziness and headaches.  Psychiatric/Behavioral:  Negative for depression and memory loss. The patient does not have insomnia.    Past Medical History:  Diagnosis Date   Aphasia    Arthritis    OA- fingers, toes, back pain, hip pain    Breast cancer of upper-outer quadrant of left female breast (HCC)    left breast cancer   Complication of anesthesia    slow to wake up   Disorder of bone and cartilage, unspecified    Diverticulitis    Hypothyroid    Lumbago    Malignant neoplasm of breast (female), unspecified site    Normal nuclear stress test    [facility], 07/18/2011   Osteoarthritis (arthritis due to wear and tear of joints)    Pain in joint, pelvic region and thigh    Reflux esophagitis    pt.  knowledgeable about dietary intake relative to reflux   S/P bilateral breast biopsy 2013   Shortness of breath    /w housework   Symptomatic menopausal or female climacteric states    Past Surgical History:  Procedure Laterality Date   APPENDECTOMY     BREAST BIOPSY  07/26/2011   Procedure: BREAST BIOPSY WITH NEEDLE LOCALIZATION;  Surgeon: Rolm Bookbinder, MD;  Location: [facility];  Service: General;  Laterality: Right;  Right breast wire guided biospy Needle localization @ [facility]  7:30   BREAST SURGERY     Procedure: BREAST BIOPSY WITH NEEDLE LOCALIZATION;   [place]   CATARACT EXTRACTION  2012   DILATION AND CURETTAGE OF [place]   EYE SURGERY     R cat. extracted /w IOL   LAPAROSCOPIC APPENDECTOMY N/A 03/03/2019   Procedure: APPENDECTOMY LAPAROSCOPIC;  Surgeon: Armandina Gemma, MD;  Location: [facility];  Service: General;  Laterality: N/A;   OVARIAN CYST SURGERY     TONSILLECTOMY     as a child   Social History:   reports that she quit smoking about 40 years ago. Her smoking use included cigarettes. She smoked an average of 0.25 packs per day. She has never used smokeless tobacco. She reports that she does not drink alcohol and does not use drugs.  Family History  Problem Relation Age of Onset   Cancer Sister        colon   Cancer Brother        renal   Heart disease Brother    Cancer Mother        leukemia   Peripheral vascular disease Mother    Cancer Father        lung   Heart attack Brother    Cancer Maternal Aunt        breast   Cancer Cousin    Cancer Cousin    Anesthesia problems Neg Hx     Medications: Patient's Medications  New Prescriptions   No medications on file  Previous Medications   ACETAMINOPHEN (TYLENOL) 500 MG TABLET    Take 2 tablets (1,000 mg total) by mouth every 8 (eight) hours as needed for mild pain.   CALCIUM CITRATE PO    Take 2 tablets by mouth in the morning and at bedtime. Totaling 1260 mg daily   CHOLECALCIFEROL (VITAMIN D3) 25 MCG (1000 UT) CAPS    Take by mouth daily.   EZETIMIBE (ZETIA) 10 MG TABLET    TAKE 1 TABLET BY MOUTH EVERY DAY   IBUPROFEN (ADVIL,MOTRIN) 100 MG TABLET    Take 400 mg by mouth every 6 (six) hours as needed for pain.    LEVOTHYROXINE (SYNTHROID) 75 MCG TABLET    Take 1 tablet (75 mcg total) by mouth daily before breakfast.   MELATONIN 3 MG TABS TABLET    Take 1 tablet (3 mg total) by mouth at bedtime as needed (insomnia).   VITAMIN E 400 UNIT CAPSULE    Take 800 Units by mouth daily.   Modified Medications   No medications on file   Discontinued Medications   No medications on file    Physical Exam:  Vitals:   10/11/20 1006  BP: 118/77  Pulse: 72  Temp: 97.6 F (36.4 C)  TempSrc: Temporal  SpO2: 96%  Weight: 148 lb (67.1 kg)  Height: 5\' 2"  (1.575 m)   Body mass index is 27.07 kg/m. Wt Readings from Last 3  Encounters:  10/11/20 148 lb (67.1 kg)  08/18/20 151 lb (68.5 kg)  05/08/20 148 lb 6.4 oz (67.3 kg)    Physical Exam Constitutional:      General: She is not in acute distress.    Appearance: She is well-developed. She is not diaphoretic.  HENT:     Head: Normocephalic and atraumatic.     Mouth/Throat:     Pharynx: No oropharyngeal exudate.  Eyes:     Conjunctiva/sclera: Conjunctivae normal.     Pupils: Pupils are equal, round, and reactive to light.  Cardiovascular:     Rate and Rhythm: Normal rate and regular rhythm.     Heart sounds: Normal heart sounds.  Pulmonary:     Effort: Pulmonary effort is normal.     Breath sounds: Normal breath sounds.  Abdominal:     General: Bowel sounds are normal.     Palpations: Abdomen is soft.  Musculoskeletal:     Cervical back: Normal range of motion and neck supple.     Right lower leg: No edema.     Left lower leg: No edema.  Skin:    General: Skin is warm and dry.  Neurological:     Mental Status: She is alert.  Psychiatric:        Mood and Affect: Mood normal.    Labs reviewed: Basic Metabolic Panel: No results for input(s): NA, K, CL, CO2, GLUCOSE, BUN, CREATININE, CALCIUM, MG, PHOS, TSH in the last 8760 hours. Liver Function Tests: No results for input(s): AST, ALT, ALKPHOS, BILITOT, PROT, ALBUMIN in the last 8760 hours. No results for input(s): LIPASE, AMYLASE in the last 8760 hours. No results for input(s): AMMONIA in the last 8760 hours. CBC: No results for input(s): WBC, NEUTROABS, HGB, HCT, MCV, PLT in the last 8760 hours. Lipid Panel: Recent Labs    04/04/20 0930  CHOL 199  HDL 64  LDLCALC 115*  TRIG 95  CHOLHDL 3.1    TSH: No results for input(s): TSH in the last 8760 hours. A1C: No results found for: HGBA1C   Assessment/Plan 1. Osteopenia of multiple sites -bone density up to date from 3/22, continue cal and vit d with weight bearing exercises.  - COMPLETE METABOLIC PANEL WITH GFR - CBC with Differential/Platelet  2. Acquired hypothyroidism Continues on synthroid 75 mcg - TSH  3. Pure hypercholesterolemia -continues on zetia, recommended lifestyle modifications - COMPLETE METABOLIC PANEL WITH GFR - Lipid Panel  4. Primary osteoarthritis involving multiple joints -stable. Knee pain improved after injection. - COMPLETE METABOLIC PANEL WITH GFR - CBC with Differential/Platelet  5. Slow transit constipation -controlled with diet  6. Anxiety -husband at end of life with metastatic prostate cancer. She is providing care at home at this time but feels like he may need increase in care. He is wanting to stay at home. Overall she feels like her anxiety is controlled at this time but concerned this may worsen in the days ahead. We discussed SSRI but this may take a few weeks to reach therapeutic levels. She was given Rx for lorazepam in the past which made her lethargic. Consider SSRI if needed orlow dose alprazolam (may also make her lethargic)   Next appt: 6 months, sooner if needed Sylvia [name], [facility] [phone]

## 2020-10-12 ENCOUNTER — Ambulatory Visit: Payer: Medicare Other | Admitting: Internal Medicine

## 2020-10-12 LAB — COMPLETE METABOLIC PANEL WITH GFR
AG Ratio: 1.8 (calc) (ref 1.0–2.5)
ALT: 11 U/L (ref 6–29)
AST: 17 U/L (ref 10–35)
Albumin: 4.3 g/dL (ref 3.6–5.1)
Alkaline phosphatase (APISO): 45 U/L (ref 37–153)
BUN: 13 mg/dL (ref 7–25)
CO2: 25 mmol/L (ref 20–32)
Calcium: 8.8 mg/dL (ref 8.6–10.4)
Chloride: 104 mmol/L (ref 98–110)
Creat: 0.78 mg/dL (ref 0.60–0.93)
GFR, Est African American: 86 mL/min/{1.73_m2} (ref >=60)
GFR, Est Non African American: 74 mL/min/{1.73_m2} (ref >=60)
Globulin: 2.4 g/dL (calc) (ref 1.9–3.7)
Glucose, Bld: 85 mg/dL (ref 65–99)
Potassium: 4.2 mmol/L (ref 3.5–5.3)
Sodium: 138 mmol/L (ref 135–146)
Total Bilirubin: 0.6 mg/dL (ref 0.2–1.2)
Total Protein: 6.7 g/dL (ref 6.1–8.1)

## 2020-10-12 LAB — CBC WITH DIFFERENTIAL/PLATELET
Absolute Monocytes: 320 cells/uL (ref 200–950)
Basophils Absolute: 31 cells/uL (ref 0–200)
Basophils Relative: 0.8 %
Eosinophils Absolute: 51 cells/uL (ref 15–500)
Eosinophils Relative: 1.3 %
HCT: 42 % (ref 35.0–45.0)
Hemoglobin: 14.2 g/dL (ref 11.7–15.5)
Lymphs Abs: 1431 cells/uL (ref 850–3900)
MCH: 31.5 pg (ref 27.0–33.0)
MCHC: 33.8 g/dL (ref 32.0–36.0)
MCV: 93.1 fL (ref 80.0–100.0)
MPV: 9.4 fL (ref 7.5–12.5)
Monocytes Relative: 8.2 %
Neutro Abs: 2067 cells/uL (ref 1500–7800)
Neutrophils Relative %: 53 %
Platelets: 249 10*3/uL (ref 140–400)
RBC: 4.51 10*6/uL (ref 3.80–5.10)
RDW: 13 % (ref 11.0–15.0)
Total Lymphocyte: 36.7 %
WBC: 3.9 10*3/uL (ref 3.8–10.8)

## 2020-10-12 LAB — LIPID PANEL
Cholesterol: 202 mg/dL — ABNORMAL HIGH (ref <200)
HDL: 65 mg/dL (ref >=50)
LDL Cholesterol (Calc): 112 mg/dL (calc) — ABNORMAL HIGH
Non-HDL Cholesterol (Calc): 137 mg/dL (calc) — ABNORMAL HIGH (ref <130)
Total CHOL/HDL Ratio: 3.1 (calc) (ref <5.0)
Triglycerides: 134 mg/dL (ref <150)

## 2020-10-12 LAB — TSH: TSH: 2.79 mIU/L (ref 0.40–4.50)

## 2020-10-25 ENCOUNTER — Other Ambulatory Visit: Payer: Self-pay | Admitting: *Deleted

## 2020-10-25 DIAGNOSIS — E7800 Pure hypercholesterolemia, unspecified: Secondary | ICD-10-CM

## 2020-10-25 MED ORDER — EZETIMIBE 10 MG PO TABS: 10.0000 mg | ORAL_TABLET | Freq: Every day | ORAL | 1 refills | Status: DC

## 2020-10-25 NOTE — Telephone Encounter (Signed)
[facility] Requested refill.

## 2021-02-08 ENCOUNTER — Encounter: Payer: Self-pay | Admitting: Nurse Practitioner

## 2021-02-12 ENCOUNTER — Encounter: Payer: Self-pay | Admitting: Nurse Practitioner

## 2021-02-13 ENCOUNTER — Ambulatory Visit (INDEPENDENT_AMBULATORY_CARE_PROVIDER_SITE_OTHER): Payer: Medicare Other | Admitting: *Deleted

## 2021-02-13 ENCOUNTER — Other Ambulatory Visit: Payer: Self-pay

## 2021-02-13 DIAGNOSIS — Z23 Encounter for immunization: Secondary | ICD-10-CM | POA: Diagnosis not present

## 2021-03-20 ENCOUNTER — Other Ambulatory Visit: Payer: Self-pay | Admitting: *Deleted

## 2021-03-20 DIAGNOSIS — E039 Hypothyroidism, unspecified: Secondary | ICD-10-CM

## 2021-03-20 MED ORDER — LEVOTHYROXINE SODIUM 75 MCG PO TABS
75.0000 ug | ORAL_TABLET | Freq: Every day | ORAL | 1 refills | Status: DC
Start: 2021-03-20 — End: 2021-09-17

## 2021-03-20 NOTE — Telephone Encounter (Signed)
Pharmacy requested refill

## 2021-04-09 DIAGNOSIS — Z1231 Encounter for screening mammogram for malignant neoplasm of breast: Secondary | ICD-10-CM | POA: Diagnosis not present

## 2021-04-13 ENCOUNTER — Encounter: Payer: Self-pay | Admitting: Nurse Practitioner

## 2021-04-13 ENCOUNTER — Other Ambulatory Visit: Payer: Self-pay

## 2021-04-13 ENCOUNTER — Ambulatory Visit (INDEPENDENT_AMBULATORY_CARE_PROVIDER_SITE_OTHER): Payer: Medicare Other | Admitting: Nurse Practitioner

## 2021-04-13 VITALS — BP 110/70 | HR 73 | Temp 97.9°F | Ht 62.0 in | Wt 151.0 lb

## 2021-04-13 DIAGNOSIS — G4700 Insomnia, unspecified: Secondary | ICD-10-CM | POA: Diagnosis not present

## 2021-04-13 DIAGNOSIS — E7800 Pure hypercholesterolemia, unspecified: Secondary | ICD-10-CM

## 2021-04-13 DIAGNOSIS — K5901 Slow transit constipation: Secondary | ICD-10-CM | POA: Diagnosis not present

## 2021-04-13 DIAGNOSIS — R079 Chest pain, unspecified: Secondary | ICD-10-CM

## 2021-04-13 DIAGNOSIS — N6320 Unspecified lump in the left breast, unspecified quadrant: Secondary | ICD-10-CM

## 2021-04-13 DIAGNOSIS — E039 Hypothyroidism, unspecified: Secondary | ICD-10-CM

## 2021-04-13 MED ORDER — TRAZODONE HCL 50 MG PO TABS: 25.0000 mg | ORAL_TABLET | Freq: Every evening | ORAL | 3 refills | Status: DC | PRN

## 2021-04-13 NOTE — Progress Notes (Signed)
Careteam: Patient Care Team: Lauree Chandler, NP as PCP - General (Geriatric Medicine) Warren Danes, PA-C as Physician Assistant (Dermatology) Magrinat, Virgie Dad, MD as Consulting Physician (Oncology) Wilford Corner, MD as Consulting Physician (Gastroenterology)  PLACE OF SERVICE:  [facility] Directive information Does Patient Have a Medical Advance Directive?: Yes, Type of Advance Directive: [place];Living will, Does patient want to make changes to medical advance directive?: No - Patient declined  Allergies  Allergen Reactions   Benadryl [Diphenhydramine Hcl] Other (See Comments)    "Super hyper"   Tape     Skin breakdown/redness    Boniva [Ibandronate Sodium] Other (See Comments)    Neck, shoulder limited mobility    Peridin-C [Ascorbic Acid] Diarrhea   Venlafaxine Nausea Only   Chlorhexidine Gluconate Itching   Codeine Other (See Comments)    "can't wake up from it."   Sulfa Antibiotics Rash    Patient unsure of reaction    Chief Complaint  Patient presents with   Medical Management of Chronic Issues    6 month follow-up. Discuss need for shingrix and covid or postpone if patient refuses. Discuss OTC sleep aid.      HPI: Patient is a 75 y.o. female for routine follow up.  She has eaten today.   She has had left breast pain (side of chest )- will last 3-5 mins generally.  Earlier this week it lasted 20 mins strap pain.  Reports she has been dealing with this for years- will come up once ever few years. Generally happens when she is in bed when she reaches.  Not with exertion, takes breath away.  No dizziness, lightheadedness, not diaphoretic.   Reports her sisters husband is in hospice Her husband passed in July, seeing the grief counselor   She is taking benadryl to help her sleep- she is wide awake when she goes to bed. Tossing and turning all night.  Melatonin did not work.  Sleeping in different places.   Helping her sister and sometimes pulls all an nighters.    Review of Systems:  Review of Systems  Constitutional:  Negative for chills, fever and weight loss.  HENT:  Negative for tinnitus.   Respiratory:  Negative for cough, sputum production and shortness of breath.   Cardiovascular:  Negative for chest pain, palpitations and leg swelling.  Gastrointestinal:  Negative for abdominal pain, constipation, diarrhea and heartburn.  Genitourinary:  Negative for dysuria, frequency and urgency.  Musculoskeletal:  Negative for back pain, falls, joint pain and myalgias.  Skin: Negative.   Neurological:  Negative for dizziness and headaches.  Psychiatric/Behavioral:  Negative for depression and memory loss. The patient does not have insomnia.    Past Medical History:  Diagnosis Date   Aphasia    Arthritis    OA- fingers, toes, back pain, hip pain    Breast cancer of upper-outer quadrant of left female breast (HCC)    left breast cancer   Complication of anesthesia    slow to wake up   Disorder of bone and cartilage, unspecified    Diverticulitis    Hypothyroid    Lumbago    Malignant neoplasm of breast (female), unspecified site    Normal nuclear stress test    [facility], 07/18/2011   Osteoarthritis (arthritis due to wear and tear of joints)    Pain in joint, pelvic region and thigh    Reflux esophagitis    pt. knowledgeable about dietary intake relative  to reflux   S/P bilateral breast biopsy 2013   Shortness of breath    /w housework   Symptomatic menopausal or female climacteric states    Past Surgical History:  Procedure Laterality Date   APPENDECTOMY     BREAST BIOPSY  07/26/2011   Procedure: BREAST BIOPSY WITH NEEDLE LOCALIZATION;  Surgeon: Rolm Bookbinder, MD;  Location: [facility];  Service: General;  Laterality: Right;  Right breast wire guided biospy Needle localization @ [facility]  7:30   BREAST SURGERY     Procedure: BREAST BIOPSY WITH NEEDLE LOCALIZATION;  [facility]   CATARACT  EXTRACTION  2012   DILATION AND CURETTAGE OF UTERUS  1978   ECTOPIC PREGNANCY SURGERY     1980   EYE SURGERY     R cat. extracted /w IOL   LAPAROSCOPIC APPENDECTOMY N/A 03/03/2019   Procedure: APPENDECTOMY LAPAROSCOPIC;  Surgeon: Armandina Gemma, MD;  Location: [facility];  Service: General;  Laterality: N/A;   OVARIAN CYST SURGERY     TONSILLECTOMY     as a child   Social History:   reports that she quit smoking about 40 years ago. Her smoking use included cigarettes. She smoked an average of .25 packs per day. She has never used smokeless tobacco. She reports that she does not drink alcohol and does not use drugs.  Family History  Problem Relation Age of Onset   Cancer Sister        colon   Cancer Brother        renal   Heart disease Brother    Cancer Mother        leukemia   Peripheral vascular disease Mother    Cancer Father        lung   Heart attack Brother    Cancer Maternal Aunt        breast   Cancer Cousin    Cancer Cousin    Anesthesia problems Neg Hx     Medications: Patient's Medications  New Prescriptions   TRAZODONE (DESYREL) 50 MG TABLET    Take 0.5-1 tablets (25-50 mg total) by mouth at bedtime as needed for sleep.  Previous Medications   ACETAMINOPHEN (TYLENOL) 500 MG TABLET    Take 2 tablets (1,000 mg total) by mouth every 8 (eight) hours as needed for mild pain.   CALCIUM CITRATE PO    Take 2 tablets by mouth in the morning and at bedtime. Totaling 1260 mg daily   CHOLECALCIFEROL (VITAMIN D3) 25 MCG (1000 UT) CAPS    Take by mouth daily.   DIPHENHYDRAMINE (BENADRYL) 50 MG CAPSULE    Take 100 mg by mouth at bedtime.   EZETIMIBE (ZETIA) 10 MG TABLET    Take 1 tablet (10 mg total) by mouth daily.   IBUPROFEN (ADVIL,MOTRIN) 100 MG TABLET    Take 400 mg by mouth every 6 (six) hours as needed for pain.    LEVOTHYROXINE (SYNTHROID) 75 MCG TABLET    Take 1 tablet (75 mcg total) by mouth daily before breakfast.   VITAMIN E 400 UNIT CAPSULE    Take 800 Units by mouth  daily.   Modified Medications   No medications on file  Discontinued Medications   MELATONIN 3 MG TABS TABLET    Take 1 tablet (3 mg total) by mouth at bedtime as needed (insomnia).    Physical Exam:  Vitals:   04/13/21 1032  BP: 110/70  Pulse: 73  Temp: 97.9 F (36.6 C)  TempSrc: Temporal  SpO2:  97%  Weight: 151 lb (68.5 kg)  Height: 5\' 2"  (1.575 m)   Body mass index is 27.62 kg/m. Wt Readings from Last 3 Encounters:  04/13/21 151 lb (68.5 kg)  10/11/20 148 lb (67.1 kg)  08/18/20 151 lb (68.5 kg)    Physical Exam Constitutional:      General: She is not in acute distress.    Appearance: She is well-developed. She is not diaphoretic.  HENT:     Head: Normocephalic and atraumatic.     Mouth/Throat:     Pharynx: No oropharyngeal exudate.  Eyes:     Conjunctiva/sclera: Conjunctivae normal.     Pupils: Pupils are equal, round, and reactive to light.  Cardiovascular:     Rate and Rhythm: Normal rate and regular rhythm.     Heart sounds: Normal heart sounds.  Pulmonary:     Effort: Pulmonary effort is normal.     Breath sounds: Normal breath sounds.  Abdominal:     General: Bowel sounds are normal.     Palpations: Abdomen is soft.  Musculoskeletal:     Cervical back: Normal range of motion and neck supple.     Right lower leg: No edema.     Left lower leg: No edema.  Skin:    General: Skin is warm and dry.  Neurological:     Mental Status: She is alert.  Psychiatric:        Mood and Affect: Mood normal.    Labs reviewed: Basic Metabolic Panel: Recent Labs    10/11/20 1033  NA 138  K 4.2  CL 104  CO2 25  GLUCOSE 85  BUN 13  CREATININE 0.78  CALCIUM 8.8  TSH 2.79   Liver Function Tests: Recent Labs    10/11/20 1033  AST 17  ALT 11  BILITOT 0.6  PROT 6.7   No results for input(s): LIPASE, AMYLASE in the last 8760 hours. No results for input(s): AMMONIA in the last 8760 hours. CBC: Recent Labs    10/11/20 1033  WBC 3.9  NEUTROABS 2,067   HGB 14.2  HCT 42.0  MCV 93.1  PLT 249   Lipid Panel: Recent Labs    10/11/20 1033  CHOL 202*  HDL 65  LDLCALC 112*  TRIG 134  CHOLHDL 3.1   TSH: Recent Labs    10/11/20 1033  TSH 2.79   A1C: No results found for: HGBA1C   Assessment/Plan 1. Mass of left breast, unspecified quadrant -noted on mammogram, physical exam without abnormal findings - US BREAST COMPLETE UNI LEFT INC AXILLA; Future  2. Insomnia, unspecified type -currently using OTC benadryl. Will have her stop due to side effects.  -helping sleep handouts given.  -to start trazodone, can use 25 mg by mouth at bedtime, if needed can increase to 50 mg at bedtime. - traZODone (DESYREL) 50 MG tablet; Take 0.5-1 tablets (25-50 mg total) by mouth at bedtime as needed for sleep.  Dispense: 30 tablet; Refill: 3  3. Pure hypercholesterolemia -diet modifications with zetia recommended   4. Slow transit constipation -controlled with dietary modifications.   5. Acquired hypothyroidism -continues on synthroid 75 mcg   6. Chest pain, unspecified type -having side chest and breast pain that lasted 20 minutes and resolved. No rash, sores. No tenderness noted. Suspect muscular.  - EKG 12-Lead- NSR, rate 60   Next appt: 6 months.  Carlos American. Justice, [facility] [phone]

## 2021-04-13 NOTE — Patient Instructions (Signed)
Stop benadryl  Start trazodone nightly, can start with 25 mg at bedtime if needed increase to 50 mg to help with sleep.  Can do body scanning relaxation technique   Call [facility] to set up ultrasound

## 2021-04-20 ENCOUNTER — Other Ambulatory Visit: Payer: Self-pay | Admitting: Nurse Practitioner

## 2021-04-20 DIAGNOSIS — E7800 Pure hypercholesterolemia, unspecified: Secondary | ICD-10-CM

## 2021-05-07 ENCOUNTER — Other Ambulatory Visit: Payer: Self-pay | Admitting: Nurse Practitioner

## 2021-05-07 DIAGNOSIS — G4700 Insomnia, unspecified: Secondary | ICD-10-CM

## 2021-05-10 DIAGNOSIS — R928 Other abnormal and inconclusive findings on diagnostic imaging of breast: Secondary | ICD-10-CM | POA: Diagnosis not present

## 2021-05-11 ENCOUNTER — Encounter: Payer: Medicare Other | Admitting: Nurse Practitioner

## 2021-05-24 ENCOUNTER — Other Ambulatory Visit: Payer: Self-pay

## 2021-05-24 ENCOUNTER — Encounter: Payer: Self-pay | Admitting: Nurse Practitioner

## 2021-05-24 ENCOUNTER — Ambulatory Visit (INDEPENDENT_AMBULATORY_CARE_PROVIDER_SITE_OTHER): Payer: Medicare Other | Admitting: Nurse Practitioner

## 2021-05-24 ENCOUNTER — Telehealth: Payer: Self-pay

## 2021-05-24 DIAGNOSIS — Z0000 Encounter for general adult medical examination without abnormal findings: Secondary | ICD-10-CM | POA: Diagnosis not present

## 2021-05-24 NOTE — Progress Notes (Signed)
This service is provided via telemedicine  No vital signs collected/recorded due to the encounter was a telemedicine visit.   Location of patient (ex: home, work):  Home  Patient consents to a telephone visit:  Yes, see encounter dated 05/24/2021  Location of the provider (ex: office, home):  [place]  Name of any referring provider:  N/A  Names of all persons participating in the telemedicine service and their role in the encounter:  Sherrie Mustache, Nurse Practitioner, Carroll Kinds, CMA, and patient.   Time spent on call:  8 minutes with medical assistant

## 2021-05-24 NOTE — Telephone Encounter (Signed)
Ms. ellena, kamen are scheduled for a virtual visit with your provider today.    Just as we do with appointments in the office, we must obtain your consent to participate.  Your consent will be active for this visit and any virtual visit you may have with one of our providers in the next 365 days.    If you have a MyChart account, I can also send a copy of this consent to you electronically.  All virtual visits are billed to your insurance company just like a traditional visit in the office.  As this is a virtual visit, video technology does not allow for your provider to perform a traditional examination.  This may limit your provider's ability to fully assess your condition.  If your provider identifies any concerns that need to be evaluated in person or the need to arrange testing such as labs, EKG, etc, we will make arrangements to do so.    Although advances in technology are sophisticated, we cannot ensure that it will always work on either your end or our end.  If the connection with a video visit is poor, we may have to switch to a telephone visit.  With either a video or telephone visit, we are not always able to ensure that we have a secure connection.   I need to obtain your verbal consent now.   Are you willing to proceed with your visit today?   ELISANDRA DESHMUKH has provided verbal consent on 05/24/2021 for a virtual visit (video or telephone).   Carroll Kinds, CMA 05/24/2021  10:56 AM

## 2021-05-24 NOTE — Patient Instructions (Signed)
Sylvia [name] , Thank you for taking time to come for your Medicare Wellness Visit. I appreciate your ongoing commitment to your health goals. Please review the following plan we discussed and let me know if I can assist you in the future.   Screening recommendations/referrals: Colonoscopy up to date Mammogram up to date Bone Density up to date Recommended yearly ophthalmology/optometry visit for glaucoma screening and checkup Recommended yearly dental visit for hygiene and checkup  Vaccinations: Influenza vaccine up to date Pneumococcal vaccine up to date Tdap vaccine up to date Shingles vaccine -RECOMMENDED, to get at local pharmacy   Advanced directives: on file   Conditions/risks identified: advance age  Next appointment: yearly    Preventive Care 28 Years and Older, Female Preventive care refers to lifestyle choices and visits with your health care provider that can promote health and wellness. What does preventive care include? A yearly physical exam. This is also called an annual well check. Dental exams once or twice a year. Routine eye exams. Ask your health care provider how often you should have your eyes checked. Personal lifestyle choices, including: Daily care of your teeth and gums. Regular physical activity. Eating a healthy diet. Avoiding tobacco and drug use. Limiting alcohol use. Practicing safe sex. Taking low-dose aspirin every day. Taking vitamin and mineral supplements as recommended by your health care provider. What happens during an annual well check? The services and screenings done by your health care provider during your annual well check will depend on your age, overall health, lifestyle risk factors, and family history of disease. Counseling  Your health care provider may ask you questions about your: Alcohol use. Tobacco use. Drug use. Emotional well-being. Home and relationship well-being. Sexual activity. Eating habits. History of  falls. Memory and ability to understand (cognition). Work and work Statistician. Reproductive health. Screening  You may have the following tests or measurements: Height, weight, and BMI. Blood pressure. Lipid and cholesterol levels. These may be checked every 5 years, or more frequently if you are over 68 years old. Skin check. Lung cancer screening. You may have this screening every year starting at age 63 if you have a 30-pack-year history of smoking and currently smoke or have quit within the past 15 years. Fecal occult blood test (FOBT) of the stool. You may have this test every year starting at age 42. Flexible sigmoidoscopy or colonoscopy. You may have a sigmoidoscopy every 5 years or a colonoscopy every 10 years starting at age 65. Hepatitis C blood test. Hepatitis B blood test. Sexually transmitted disease (STD) testing. Diabetes screening. This is done by checking your blood sugar (glucose) after you have not eaten for a while (fasting). You may have this done every 1-3 years. Bone density scan. This is done to screen for osteoporosis. You may have this done starting at age 52. Mammogram. This may be done every 1-2 years. Talk to your health care provider about how often you should have regular mammograms. Talk with your health care provider about your test results, treatment options, and if necessary, the need for more tests. Vaccines  Your health care provider may recommend certain vaccines, such as: Influenza vaccine. This is recommended every year. Tetanus, diphtheria, and acellular pertussis (Tdap, Td) vaccine. You may need a Td booster every 10 years. Zoster vaccine. You may need this after age 34. Pneumococcal 13-valent conjugate (PCV13) vaccine. One dose is recommended after age 7. Pneumococcal polysaccharide (PPSV23) vaccine. One dose is recommended after age 65. Talk to your  health care provider about which screenings and vaccines you need and how often you need  them. This information is not intended to replace advice given to you by your health care provider. Make sure you discuss any questions you have with your health care provider. Document Released: 05/12/2015 Document Revised: 01/03/2016 Document Reviewed: 02/14/2015 Elsevier Interactive Patient Education  2017 [place] Prevention in the Home Falls can cause injuries. They can happen to people of all ages. There are many things you can do to make your home safe and to help prevent falls. What can I do on the outside of my home? Regularly fix the edges of walkways and driveways and fix any cracks. Remove anything that might make you trip as you walk through a door, such as a raised step or threshold. Trim any bushes or trees on the path to your home. Use bright outdoor lighting. Clear any walking paths of anything that might make someone trip, such as rocks or tools. Regularly check to see if handrails are loose or broken. Make sure that both sides of any steps have handrails. Any raised decks and porches should have guardrails on the edges. Have any leaves, snow, or ice cleared regularly. Use sand or salt on walking paths during winter. Clean up any spills in your garage right away. This includes oil or grease spills. What can I do in the bathroom? Use night lights. Install grab bars by the toilet and in the tub and shower. Do not use towel bars as grab bars. Use non-skid mats or decals in the tub or shower. If you need to sit down in the shower, use a plastic, non-slip stool. Keep the floor dry. Clean up any water that spills on the floor as soon as it happens. Remove soap buildup in the tub or shower regularly. Attach bath mats securely with double-sided non-slip rug tape. Do not have throw rugs and other things on the floor that can make you trip. What can I do in the bedroom? Use night lights. Make sure that you have a light by your bed that is easy to reach. Do not use  any sheets or blankets that are too big for your bed. They should not hang down onto the floor. Have a firm chair that has side arms. You can use this for support while you get dressed. Do not have throw rugs and other things on the floor that can make you trip. What can I do in the kitchen? Clean up any spills right away. Avoid walking on wet floors. Keep items that you use a lot in easy-to-reach places. If you need to reach something above you, use a strong step stool that has a grab bar. Keep electrical cords out of the way. Do not use floor polish or wax that makes floors slippery. If you must use wax, use non-skid floor wax. Do not have throw rugs and other things on the floor that can make you trip. What can I do with my stairs? Do not leave any items on the stairs. Make sure that there are handrails on both sides of the stairs and use them. Fix handrails that are broken or loose. Make sure that handrails are as long as the stairways. Check any carpeting to make sure that it is firmly attached to the stairs. Fix any carpet that is loose or worn. Avoid having throw rugs at the top or bottom of the stairs. If you do have throw rugs, attach them  to the floor with carpet tape. Make sure that you have a light switch at the top of the stairs and the bottom of the stairs. If you do not have them, ask someone to add them for you. What else can I do to help prevent falls? Wear shoes that: Do not have high heels. Have rubber bottoms. Are comfortable and fit you well. Are closed at the toe. Do not wear sandals. If you use a stepladder: Make sure that it is fully opened. Do not climb a closed stepladder. Make sure that both sides of the stepladder are locked into place. Ask someone to hold it for you, if possible. Clearly mark and make sure that you can see: Any grab bars or handrails. First and last steps. Where the edge of each step is. Use tools that help you move around (mobility aids)  if they are needed. These include: Canes. Walkers. Scooters. Crutches. Turn on the lights when you go into a dark area. Replace any light bulbs as soon as they burn out. Set up your furniture so you have a clear path. Avoid moving your furniture around. If any of your floors are uneven, fix them. If there are any pets around you, be aware of where they are. Review your medicines with your doctor. Some medicines can make you feel dizzy. This can increase your chance of falling. Ask your doctor what other things that you can do to help prevent falls. This information is not intended to replace advice given to you by your health care provider. Make sure you discuss any questions you have with your health care provider. Document Released: 02/09/2009 Document Revised: 09/21/2015 Document Reviewed: 05/20/2014 Elsevier Interactive Patient Education  2017 [employer].

## 2021-05-24 NOTE — Progress Notes (Signed)
Subjective:   Sylvia [name] is a 76 y.o. female who presents for Medicare Annual (Subsequent) preventive examination.  Review of Systems           Objective:    There were no vitals filed for this visit. There is no height or weight on file to calculate BMI.  Advanced Directives 05/24/2021 04/13/2021 10/11/2020 05/08/2020 04/06/2020 10/07/2019 04/20/2019  Does Patient Have a Medical Advance Directive? Yes Yes Yes Yes Yes Yes Yes  Type of Paramedic of [place];Living will [place];Living will [place];Living will [place];Living will [place];Living will Out of facility DNR (pink MOST or yellow form) [place];Living will  Does patient want to make changes to medical advance directive? No - Patient declined No - Patient declined No - Patient declined No - Patient declined No - Patient declined No - Patient declined No - Patient declined  Copy of [place] in Chart? Yes - validated most recent copy scanned in chart (See row information) Yes - validated most recent copy scanned in chart (See row information) Yes - validated most recent copy scanned in chart (See row information) Yes - validated most recent copy scanned in chart (See row information) No - copy requested - Yes - validated most recent copy scanned in chart (See row information)  Would patient like information on creating a medical advance directive? - - - - - - -  Pre-existing out of facility DNR order (yellow form or pink MOST form) - - - - Pink MOST/Yellow Form most recent copy in chart - Physician notified to receive inpatient order Pink MOST/Yellow Form most recent copy in chart - Physician notified to receive inpatient order -    Current Medications (verified) Outpatient Encounter Medications as of 05/24/2021  Medication Sig   acetaminophen (TYLENOL) 500 MG tablet Take 2 tablets  (1,000 mg total) by mouth every 8 (eight) hours as needed for mild pain.   CALCIUM CITRATE PO Take 2 tablets by mouth in the morning and at bedtime. Totaling 1260 mg daily   Cholecalciferol (VITAMIN D3) 25 MCG (1000 UT) CAPS Take by mouth daily.   diphenhydramine-acetaminophen (TYLENOL PM) 25-500 MG TABS tablet Take 1 tablet by mouth at bedtime.   ezetimibe (ZETIA) 10 MG tablet TAKE 1 TABLET BY MOUTH EVERY DAY   ibuprofen (ADVIL,MOTRIN) 100 MG tablet Take 400 mg by mouth every 6 (six) hours as needed for pain.    levothyroxine (SYNTHROID) 75 MCG tablet Take 1 tablet (75 mcg total) by mouth daily before breakfast.   vitamin E 400 UNIT capsule Take 800 Units by mouth daily.    traZODone (DESYREL) 50 MG tablet Take 0.5-1 tablets (25-50 mg total) by mouth at bedtime as needed for sleep. (Patient not taking: Reported on 05/24/2021)   [DISCONTINUED] diphenhydrAMINE (BENADRYL) 50 MG capsule Take 100 mg by mouth at bedtime.   No facility-administered encounter medications on file as of 05/24/2021.    Allergies (verified) Benadryl [diphenhydramine hcl], Tape, Boniva [ibandronate sodium], Peridin-c [ascorbic acid], Venlafaxine, Chlorhexidine gluconate, Codeine, and Sulfa antibiotics   History: Past Medical History:  Diagnosis Date   Aphasia    Arthritis    OA- fingers, toes, back pain, hip pain    Breast cancer of upper-outer quadrant of left female breast (HCC)    left breast cancer   Complication of anesthesia    slow to wake up   Disorder of bone and  cartilage, unspecified    Diverticulitis    Hypothyroid    Lumbago    Malignant neoplasm of breast (female), unspecified site    Normal nuclear stress test    [facility], 07/18/2011   Osteoarthritis (arthritis due to wear and tear of joints)    Pain in joint, pelvic region and thigh    Reflux esophagitis    pt. knowledgeable about dietary intake relative to reflux   S/P bilateral breast biopsy 2013   Shortness of breath    /w housework    Symptomatic menopausal or female climacteric states    Past Surgical History:  Procedure Laterality Date   APPENDECTOMY     BREAST BIOPSY  07/26/2011   Procedure: BREAST BIOPSY WITH NEEDLE LOCALIZATION;  Surgeon: Rolm Bookbinder, MD;  Location: [place];  Service: General;  Laterality: Right;  Right breast wire guided biospy Needle localization @ [facility]  7:30   BREAST SURGERY     Procedure: BREAST BIOPSY WITH NEEDLE LOCALIZATION;  [place]   CATARACT EXTRACTION  2012   DILATION AND CURETTAGE OF [place]     R cat. extracted /w IOL   LAPAROSCOPIC APPENDECTOMY N/A 03/03/2019   Procedure: APPENDECTOMY LAPAROSCOPIC;  Surgeon: Armandina Gemma, MD;  Location: [facility];  Service: General;  Laterality: N/A;   OVARIAN CYST SURGERY     TONSILLECTOMY     as a child   Family History  Problem Relation Age of Onset   Cancer Sister        colon   Cancer Brother        renal   Heart disease Brother    Cancer Mother        leukemia   Peripheral vascular disease Mother    Cancer Father        lung   Heart attack Brother    Cancer Maternal Aunt        breast   Cancer Cousin    Cancer Cousin    Anesthesia problems Neg Hx    Social History   Socioeconomic History   Marital status: Married    Spouse name: Not on file   Number of children: Not on file   Years of education: Not on file   Highest education level: Not on file  Occupational History   Not on file  Tobacco Use   Smoking status: Former    Packs/day: 0.25    Types: Cigarettes    Quit date: 07/22/1980    Years since quitting: 40.8   Smokeless tobacco: Never  Vaping Use   Vaping Use: Never used  Substance and Sexual Activity   Alcohol use: No   Drug use: No   Sexual activity: Not Currently    Birth control/protection: None  Other Topics Concern   Not on file  Social History Narrative   Not on file   Social Determinants of Health   Financial Resource Strain: Not on file   Food Insecurity: Not on file  Transportation Needs: Not on file  Physical Activity: Not on file  Stress: Not on file  Social Connections: Not on file    Tobacco Counseling Counseling given: Not Answered   Clinical Intake:                 Diabetic?no         Activities of Daily Living No flowsheet data found.  Patient Care Team: Lauree Chandler,  NP as PCP - General (Geriatric Medicine) Warren Danes, PA-C as Physician Assistant (Dermatology) Magrinat, Virgie Dad, MD as Consulting Physician (Oncology) Wilford Corner, MD as Consulting Physician (Gastroenterology)  Indicate any recent Medical Services you may have received from other than [facility] providers in the past year (date may be approximate).     Assessment:   This is a routine wellness examination for [facility].  Hearing/Vision screen Hearing Screening - Comments:: Patient has no hearing problems. Vision Screening - Comments:: Patient wears glasses. Patient has had eye exam. Patient sees Dr. Ebony Hail.  Dietary issues and exercise activities discussed:     Goals Addressed   None    Depression Screen PHQ 2/9 Scores 05/24/2021 04/13/2021 04/06/2020 04/20/2019 03/29/2019 11/16/2018 04/20/2018  PHQ - 2 Score 0 - 0 0 0 0 0  PHQ- 9 Score - - - - - - -  Exception Documentation - Other- indicate reason in comment box - - - - -  Not completed - seeing grief counslor through hospice - - - - -    Fall Risk Fall Risk  05/24/2021 04/13/2021 05/08/2020 04/06/2020 04/20/2019  Falls in the past year? 0 0 0 0 0  Number falls in past yr: 0 0 0 0 0  Injury with Fall? 0 0 - 0 0  Risk for fall due to : No Fall Risks No Fall Risks - - -  Follow up Falls evaluation completed Falls evaluation completed - - -    FALL RISK PREVENTION PERTAINING TO THE HOME:  Any stairs in or around the home? Yes  If so, are there any without handrails? No  Home free of loose throw rugs in walkways, pet beds, electrical cords, etc?  Yes  Adequate lighting in your home to reduce risk of falls? Yes   ASSISTIVE DEVICES UTILIZED TO PREVENT FALLS:  Life alert? No  Use of a cane, walker or w/c? No  Grab bars in the bathroom? Yes  Shower chair or bench in shower? Yes  Elevated toilet seat or a handicapped toilet? No   TIMED UP AND GO:  Was the test performed? No .    Cognitive Function: MMSE - Mini Mental State Exam 05/08/2020 04/14/2018 04/11/2017 04/11/2016 04/13/2015  Orientation to time 5 5 4 5 5   Orientation to Place 5 5 5 5 5   Registration 3 3 3 3 3   Attention/ Calculation 2 5 5 5 5   Recall 3 3 3 3 3   Language- name 2 objects 2 2 2 2 2   Language- repeat 1 1 1 1 1   Language- follow 3 step command 3 3 3 3 3   Language- read & follow direction 1 1 1 1 1   Write a sentence 1 1 1 1 1   Copy design 1 1 1 1 1   Total score 27 30 29 30 30      6CIT Screen 05/24/2021 04/20/2019  What Year? 0 points 0 points  What month? 0 points 0 points  What time? 0 points 0 points  Count back from 20 0 points 0 points  Months in reverse 0 points 0 points  Repeat phrase 10 points 0 points  Total Score 10 0    Immunizations Immunization History  Administered Date(s) Administered   Fluad Quad(high Dose 65+) 02/02/2019, 02/13/2021   Influenza Whole 03/18/2014   Influenza, High Dose Seasonal PF 02/10/2017, 02/09/2018, 02/07/2020   Influenza, Seasonal, Injecte, Preservative Fre 02/01/2016   Influenza,inj,quad, With Preservative 08/27/2020   Influenza-Unspecified 02/05/2010, 03/30/2012, 04/02/2013, 02/21/2015, 02/07/2020  PFIZER(Purple Top)SARS-COV-2 Vaccination 05/18/2019, 06/07/2019, 02/10/2020   Pneumococcal Conjugate-13 04/08/2014   Pneumococcal Polysaccharide-23 09/28/2012   Td 04/29/1978   Tdap 04/15/2013   Zoster, Live 01/24/2014    TDAP status: Up to date  Flu Vaccine status: Up to date  Pneumococcal vaccine status: Up to date  Covid-19 vaccine status: Information provided on how to obtain vaccines.    Qualifies for Shingles Vaccine? Yes   Zostavax completed No   Shingrix Completed?: No.    Education has been provided regarding the importance of this vaccine. Patient has been advised to call insurance company to determine out of pocket expense if they have not yet received this vaccine. Advised may also receive vaccine at local pharmacy or Health Dept. Verbalized acceptance and understanding.  Screening Tests Health Maintenance  Topic Date Due   Zoster Vaccines- Shingrix (1 of 2) Never done   COVID-19 Vaccine (4 - Booster for Pfizer series) 04/06/2020   COLONOSCOPY (Pts 45-17yrs Insurance coverage will need to be confirmed)  12/31/2022   TETANUS/TDAP  04/16/2023   Pneumonia Vaccine 36+ Years old  Completed   INFLUENZA VACCINE  Completed   DEXA SCAN  Completed   Hepatitis C Screening  Completed   HPV VACCINES  Aged Out    Health Maintenance  Health Maintenance Due  Topic Date Due   Zoster Vaccines- Shingrix (1 of 2) Never done   COVID-19 Vaccine (4 - Booster for Pfizer series) 04/06/2020    Colorectal cancer screening: Type of screening: Colonoscopy. Completed 2019. Repeat every 5 years  Mammogram status: Completed 2022. Repeat every year  Bone Density status: Completed 22. Results reflect: Bone density results: OSTEOPENIA. Repeat every 2 years.  Lung Cancer Screening: (Low Dose CT Chest recommended if Age 27-280 years, 30 pack-year currently smoking OR have quit w/in 15years.) does not qualify.   Lung Cancer Screening Referral: na  Additional Screening:  Hepatitis C Screening: does qualify; Completed   Vision Screening: Recommended annual ophthalmology exams for early detection of glaucoma and other disorders of the eye. Is the patient up to date with their annual eye exam?  Yes  Who is the provider or what is the name of the office in which the patient attends annual eye exams? [facility] If pt is not established with a provider, would they like to be  referred to a provider to establish care? No .   Dental Screening: Recommended annual dental exams for proper oral hygiene  Community Resource Referral / Chronic Care Management: CRR required this visit?  No   CCM required this visit?  No      Plan:     I have personally reviewed and noted the following in the patients chart:   Medical and social history Use of alcohol, tobacco or illicit drugs  Current medications and supplements including opioid prescriptions.  Functional ability and status Nutritional status Physical activity Advanced directives List of other physicians Hospitalizations, surgeries, and ER visits in previous 12 months Vitals Screenings to include cognitive, depression, and falls Referrals and appointments  In addition, I have reviewed and discussed with patient certain preventive protocols, quality metrics, and best practice recommendations. A written personalized care plan for preventive services as well as general preventive health recommendations were provided to patient.     Lauree Chandler, NP   05/24/2021    Virtual Visit via Telephone Note  I connected withNAME@ on 05/24/21 at 10:30 AM EST by telephone and verified that I am speaking with the correct person  using two identifiers.  Location: Patient: home Provider: [place]    I discussed the limitations, risks, security and privacy concerns of performing an evaluation and management service by telephone and the availability of in person appointments. I also discussed with the patient that there may be a patient responsible charge related to this service. The patient expressed understanding and agreed to proceed.   I discussed the assessment and treatment plan with the patient. The patient was provided an opportunity to ask questions and all were answered. The patient agreed with the plan and demonstrated an understanding of the instructions.   The patient was advised to call back or seek  an in-person evaluation if the symptoms worsen or if the condition fails to improve as anticipated.  I provided 18 minutes of non-face-to-face time during this encounter.  Carlos American. Harle Battiest [employer] printed and mailed

## 2021-05-25 ENCOUNTER — Encounter: Payer: Medicare Other | Admitting: Nurse Practitioner

## 2021-06-04 ENCOUNTER — Other Ambulatory Visit: Payer: Self-pay

## 2021-06-04 ENCOUNTER — Other Ambulatory Visit: Payer: Medicare Other

## 2021-06-04 ENCOUNTER — Ambulatory Visit (INDEPENDENT_AMBULATORY_CARE_PROVIDER_SITE_OTHER): Payer: Medicare Other | Admitting: Nurse Practitioner

## 2021-06-04 ENCOUNTER — Encounter: Payer: Self-pay | Admitting: Nurse Practitioner

## 2021-06-04 VITALS — BP 120/80 | HR 65 | Temp 97.7°F | Ht 62.0 in | Wt 150.6 lb

## 2021-06-04 DIAGNOSIS — E039 Hypothyroidism, unspecified: Secondary | ICD-10-CM

## 2021-06-04 DIAGNOSIS — G4700 Insomnia, unspecified: Secondary | ICD-10-CM | POA: Diagnosis not present

## 2021-06-04 DIAGNOSIS — G629 Polyneuropathy, unspecified: Secondary | ICD-10-CM | POA: Diagnosis not present

## 2021-06-04 DIAGNOSIS — E7800 Pure hypercholesterolemia, unspecified: Secondary | ICD-10-CM | POA: Diagnosis not present

## 2021-06-04 DIAGNOSIS — F4321 Adjustment disorder with depressed mood: Secondary | ICD-10-CM

## 2021-06-04 NOTE — Progress Notes (Signed)
Careteam: Patient Care Team: Lauree Chandler, NP as PCP - General (Geriatric Medicine) Warren Danes, PA-C as Physician Assistant (Dermatology) Magrinat, Virgie Dad, MD (Inactive) as Consulting Physician (Oncology) Wilford Corner, MD as Consulting Physician (Gastroenterology)  PLACE OF SERVICE:  [place]  Advanced Directive information    Allergies  Allergen Reactions   Benadryl [Diphenhydramine Hcl] Other (See Comments)    "Super hyper"   Tape     Skin breakdown/redness    Boniva [Ibandronate Sodium] Other (See Comments)    Neck, shoulder limited mobility    Peridin-C [Ascorbic Acid] Diarrhea   Venlafaxine Nausea Only   Chlorhexidine Gluconate Itching   Codeine Other (See Comments)    "can't wake up from it."   Sulfa Antibiotics Rash    Patient unsure of reaction    Chief Complaint  Patient presents with   Follow-up    6 week follow up on sleep. Patient states that she sleeps good if she takes Tylenol pm. Patient has used Tylenol consistently.  Discuss the nned for shingrix vaccine and 2nd COVID booster.     HPI: Patient is a 76 y.o. female to follow up on sleep.   She reports she started on with trazodone 1/2 tablet for 3 nights, worked a few nights but then stopped.  Then increased her trazodone to 1 tablet which worked for a while but then did not work. So she used tylenol PM.   She reports she is just not tired at night.  She does not have the want to exercise.  She would go 2 night without sleep and then crash.   She helps her daughter and does all her house keeping. Does not like to get out much (cold weather makes it worse).   Review of Systems:  Review of Systems  Constitutional:  Negative for chills, fever and weight loss.  HENT:  Negative for tinnitus.   Respiratory:  Negative for cough, sputum production and shortness of breath.   Cardiovascular:  Negative for chest pain, palpitations and leg swelling.  Gastrointestinal:  Negative for  abdominal pain, constipation, diarrhea and heartburn.  Genitourinary:  Negative for dysuria, frequency and urgency.  Musculoskeletal:  Positive for myalgias. Negative for back pain, falls and joint pain.  Skin: Negative.   Neurological:  Negative for dizziness and headaches.  Psychiatric/Behavioral:  Positive for depression. Negative for memory loss. The patient has insomnia.    Past Medical History:  Diagnosis Date   Aphasia    Arthritis    OA- fingers, toes, back pain, hip pain    Breast cancer of upper-outer quadrant of left female breast (HCC)    left breast cancer   Complication of anesthesia    slow to wake up   Disorder of bone and cartilage, unspecified    Diverticulitis    Hypothyroid    Lumbago    Malignant neoplasm of breast (female), unspecified site    Normal nuclear stress test    [facility], 07/18/2011   Osteoarthritis (arthritis due to wear and tear of joints)    Pain in joint, pelvic region and thigh    Reflux esophagitis    pt. knowledgeable about dietary intake relative to reflux   S/P bilateral breast biopsy 2013   Shortness of breath    /w housework   Symptomatic menopausal or female climacteric states    Past Surgical History:  Procedure Laterality Date   APPENDECTOMY     BREAST BIOPSY  07/26/2011   Procedure: BREAST  BIOPSY WITH NEEDLE LOCALIZATION;  Surgeon: Rolm Bookbinder, MD;  Location: [place];  Service: General;  Laterality: Right;  Right breast wire guided biospy Needle localization @ [facility]  7:30   BREAST SURGERY     Procedure: BREAST BIOPSY WITH NEEDLE LOCALIZATION;  [place]   CATARACT EXTRACTION  2012   DILATION AND CURETTAGE OF UTERUS  1978   ECTOPIC PREGNANCY SURGERY     1980   EYE SURGERY     R cat. extracted /w IOL   LAPAROSCOPIC APPENDECTOMY N/A 03/03/2019   Procedure: APPENDECTOMY LAPAROSCOPIC;  Surgeon: Armandina Gemma, MD;  Location: [facility];  Service: General;  Laterality: N/A;   OVARIAN CYST SURGERY     TONSILLECTOMY     as a child    Social History:   reports that she quit smoking about 40 years ago. Her smoking use included cigarettes. She smoked an average of .25 packs per day. She has never used smokeless tobacco. She reports that she does not drink alcohol and does not use drugs.  Family History  Problem Relation Age of Onset   Cancer Sister        colon   Cancer Brother        renal   Heart disease Brother    Cancer Mother        leukemia   Peripheral vascular disease Mother    Cancer Father        lung   Heart attack Brother    Cancer Maternal Aunt        breast   Cancer Cousin    Cancer Cousin    Anesthesia problems Neg Hx     Medications: Patient's Medications  New Prescriptions   No medications on file  Previous Medications   ACETAMINOPHEN (TYLENOL) 500 MG TABLET    Take 2 tablets (1,000 mg total) by mouth every 8 (eight) hours as needed for mild pain.   CALCIUM CITRATE PO    Take 2 tablets by mouth in the morning and at bedtime. Totaling 1260 mg daily   CHOLECALCIFEROL (VITAMIN D3) 25 MCG (1000 UT) CAPS    Take by mouth daily.   DIPHENHYDRAMINE-ACETAMINOPHEN (TYLENOL PM) 25-500 MG TABS TABLET    Take 1 tablet by mouth at bedtime.   EZETIMIBE (ZETIA) 10 MG TABLET    TAKE 1 TABLET BY MOUTH EVERY DAY   IBUPROFEN (ADVIL,MOTRIN) 100 MG TABLET    Take 400 mg by mouth every 6 (six) hours as needed for pain.    LEVOTHYROXINE (SYNTHROID) 75 MCG TABLET    Take 1 tablet (75 mcg total) by mouth daily before breakfast.   TRAZODONE (DESYREL) 50 MG TABLET    Take 0.5-1 tablets (25-50 mg total) by mouth at bedtime as needed for sleep.   VITAMIN E 400 UNIT CAPSULE    Take 800 Units by mouth daily.   Modified Medications   No medications on file  Discontinued Medications   No medications on file    Physical Exam:  Vitals:   06/04/21 0934  BP: 120/80  Pulse: 65  Temp: 97.7 F (36.5 C)  SpO2: 97%  Weight: 150 lb 9.6 oz (68.3 kg)  Height: 5' 2"  (1.575 m)   Body mass index is 27.55 kg/m. Wt  Readings from Last 3 Encounters:  06/04/21 150 lb 9.6 oz (68.3 kg)  04/13/21 151 lb (68.5 kg)  10/11/20 148 lb (67.1 kg)    Physical Exam Constitutional:      Appearance: Normal appearance.  HENT:  Head: Normocephalic and atraumatic.  Pulmonary:     Effort: Pulmonary effort is normal.  Skin:    General: Skin is warm and dry.  Neurological:     Mental Status: She is alert and oriented to person, place, and time. Mental status is at baseline.  Psychiatric:     Comments: tearful    Labs reviewed: Basic Metabolic Panel: Recent Labs    10/11/20 1033  NA 138  K 4.2  CL 104  CO2 25  GLUCOSE 85  BUN 13  CREATININE 0.78  CALCIUM 8.8  TSH 2.79   Liver Function Tests: Recent Labs    10/11/20 1033  AST 17  ALT 11  BILITOT 0.6  PROT 6.7   No results for input(s): LIPASE, AMYLASE in the last 8760 hours. No results for input(s): AMMONIA in the last 8760 hours. CBC: Recent Labs    10/11/20 1033  WBC 3.9  NEUTROABS 2,067  HGB 14.2  HCT 42.0  MCV 93.1  PLT 249   Lipid Panel: Recent Labs    10/11/20 1033  CHOL 202*  HDL 65  LDLCALC 112*  TRIG 134  CHOLHDL 3.1   TSH: Recent Labs    10/11/20 1033  TSH 2.79   A1C: No results found for: HGBA1C   Assessment/Plan 1. Pure hypercholesterolemia -follow up labs today - Lipid Panel - CBC with Differential/Platelet - CMP with eGFR([facility])  2. Acquired hypothyroidism - CBC with Differential/Platelet - TSH  3. Insomnia, unspecified type -ongoing, will have her restart trazadone 25 mg daily for 1 week then increase to trazodone 50 mg daily and to continue taking this, if needed can increase again after 1 week to 75 mg. She is also dealing with a lot of grief and some depression after the lost of her husband 7 months ago. -if trazodone not effective to use half tylenol PM to help sleep, can use other half if needed but to start with half tablet.  - CBC with Differential/Platelet  4. Grief -ongoing,  working with counselors and family support   5. Neuropathy Ongoing, has used tylenol pm which helps pain, okay to use plain 500 mg tylenol qhs for pain at this time.    Return in about 4 weeks (around 07/02/2021) for routine follow up. mood.sleep. Carlos American. [place], [facility] [phone]

## 2021-06-04 NOTE — Patient Instructions (Signed)
Restart trazodone 25 mg daily at bedtime, increase to 50 mg by mouth daily after 1 week , then okay to increase to 75 mg daily at bedtime to help with sleep.  Plain tylenol 500 mg at bedtime  If you need to after 30 mins take half tablet of the tylenol PM (okay to take this with tylenol 500 mg tablet)  Goal is for you NOT to need the tylenol PM to get you to sleep.

## 2021-06-05 LAB — COMPLETE METABOLIC PANEL WITH GFR
AG Ratio: 1.7 (calc) (ref 1.0–2.5)
ALT: 10 U/L (ref 6–29)
AST: 15 U/L (ref 10–35)
Albumin: 4.3 g/dL (ref 3.6–5.1)
Alkaline phosphatase (APISO): 51 U/L (ref 37–153)
BUN: 13 mg/dL (ref 7–25)
CO2: 28 mmol/L (ref 20–32)
Calcium: 8.8 mg/dL (ref 8.6–10.4)
Chloride: 107 mmol/L (ref 98–110)
Creat: 0.8 mg/dL (ref 0.60–1.00)
Globulin: 2.5 g/dL (calc) (ref 1.9–3.7)
Glucose, Bld: 82 mg/dL (ref 65–99)
Potassium: 4.2 mmol/L (ref 3.5–5.3)
Sodium: 140 mmol/L (ref 135–146)
Total Bilirubin: 0.6 mg/dL (ref 0.2–1.2)
Total Protein: 6.8 g/dL (ref 6.1–8.1)
eGFR: 76 mL/min/{1.73_m2} (ref >=60)

## 2021-06-05 LAB — CBC WITH DIFFERENTIAL/PLATELET
Absolute Monocytes: 288 {cells}/uL (ref 200–950)
Basophils Absolute: 40 {cells}/uL (ref 0–200)
Basophils Relative: 1.1 %
Eosinophils Absolute: 119 {cells}/uL (ref 15–500)
Eosinophils Relative: 3.3 %
HCT: 41.8 % (ref 35.0–45.0)
Hemoglobin: 14 g/dL (ref 11.7–15.5)
Lymphs Abs: 1321 {cells}/uL (ref 850–3900)
MCH: 31.4 pg (ref 27.0–33.0)
MCHC: 33.5 g/dL (ref 32.0–36.0)
MCV: 93.7 fL (ref 80.0–100.0)
MPV: 9.3 fL (ref 7.5–12.5)
Monocytes Relative: 8 %
Neutro Abs: 1832 {cells}/uL (ref 1500–7800)
Neutrophils Relative %: 50.9 %
Platelets: 212 Thousand/uL (ref 140–400)
RBC: 4.46 Million/uL (ref 3.80–5.10)
RDW: 12.6 % (ref 11.0–15.0)
Total Lymphocyte: 36.7 %
WBC: 3.6 Thousand/uL — ABNORMAL LOW (ref 3.8–10.8)

## 2021-06-05 LAB — LIPID PANEL
Cholesterol: 198 mg/dL
HDL: 60 mg/dL
LDL Cholesterol (Calc): 114 mg/dL — ABNORMAL HIGH
Non-HDL Cholesterol (Calc): 138 mg/dL — ABNORMAL HIGH
Total CHOL/HDL Ratio: 3.3 (calc)
Triglycerides: 129 mg/dL

## 2021-06-05 LAB — TSH: TSH: 1.1 mIU/L (ref 0.40–4.50)

## 2021-06-07 ENCOUNTER — Other Ambulatory Visit: Payer: Self-pay | Admitting: Nurse Practitioner

## 2021-06-07 DIAGNOSIS — G4700 Insomnia, unspecified: Secondary | ICD-10-CM

## 2021-06-11 ENCOUNTER — Encounter: Payer: Self-pay | Admitting: Nurse Practitioner

## 2021-06-25 ENCOUNTER — Encounter: Payer: Self-pay | Admitting: Nurse Practitioner

## 2021-06-25 NOTE — Telephone Encounter (Signed)
Vaccine documented. Sending message to Lauree Chandler, NP to review

## 2021-07-05 ENCOUNTER — Other Ambulatory Visit: Payer: Self-pay | Admitting: Nurse Practitioner

## 2021-07-05 DIAGNOSIS — G4700 Insomnia, unspecified: Secondary | ICD-10-CM

## 2021-07-05 NOTE — Telephone Encounter (Signed)
Confirmed Trazodone 50 mg is current medication. Last prescribed 04/13/2021 #30-3 taking 0.5-1 at bedtime as needed. Next apnt scedulef for 07/20/21 with Joelene Millin, NP, last seen 06/04/2021 by Joelene Millin, NP. ? ?To Joelene Millin, NP ?

## 2021-07-20 ENCOUNTER — Encounter: Payer: Self-pay | Admitting: Nurse Practitioner

## 2021-07-20 ENCOUNTER — Ambulatory Visit (INDEPENDENT_AMBULATORY_CARE_PROVIDER_SITE_OTHER): Payer: Medicare Other | Admitting: Nurse Practitioner

## 2021-07-20 ENCOUNTER — Other Ambulatory Visit: Payer: Self-pay

## 2021-07-20 VITALS — BP 118/72 | HR 66 | Temp 97.3°F | Ht 62.0 in | Wt 152.0 lb

## 2021-07-20 DIAGNOSIS — E7800 Pure hypercholesterolemia, unspecified: Secondary | ICD-10-CM

## 2021-07-20 DIAGNOSIS — M8589 Other specified disorders of bone density and structure, multiple sites: Secondary | ICD-10-CM

## 2021-07-20 DIAGNOSIS — E039 Hypothyroidism, unspecified: Secondary | ICD-10-CM | POA: Diagnosis not present

## 2021-07-20 DIAGNOSIS — G4700 Insomnia, unspecified: Secondary | ICD-10-CM | POA: Diagnosis not present

## 2021-07-20 DIAGNOSIS — G629 Polyneuropathy, unspecified: Secondary | ICD-10-CM

## 2021-07-20 MED ORDER — TRAZODONE HCL 100 MG PO TABS: 100.0000 mg | ORAL_TABLET | Freq: Every day | ORAL | 1 refills | Status: DC

## 2021-07-20 NOTE — Progress Notes (Signed)
? ? ?Careteam: ?Patient Care Team: ?Lauree Chandler, NP as PCP - General (Geriatric Medicine) ?Starlyn Skeans as Librarian, academic (Dermatology) ?Magrinat, Virgie Dad, MD (Inactive) as Consulting Physician (Oncology) ?Wilford Corner, MD as Consulting Physician (Gastroenterology) ? ?PLACE OF SERVICE:  ?[facility]  ?Advanced Directive information ?Does Patient Have a Medical Advance Directive?: Yes, Type of Advance Directive: [place];Living will, Does patient want to make changes to medical advance directive?: No - Patient declined ? ?Allergies  ?Allergen Reactions  ? Benadryl [Diphenhydramine Hcl] Other (See Comments)  ?  "Super hyper"  ? Tape   ?  Skin breakdown/redness ?  ? Boniva [Ibandronate Sodium] Other (See Comments)  ?  Neck, shoulder limited mobility   ? Peridin-C [Ascorbic Acid] Diarrhea  ? Venlafaxine Nausea Only  ? Chlorhexidine Gluconate Itching  ? Codeine Other (See Comments)  ?  "can't wake up from it."  ? Sulfa Antibiotics Rash  ?  Patient unsure of reaction  ? ? ?Chief Complaint  ?Patient presents with  ? Follow-up  ?  4 week follow-up on mood and sleep   ? ? ? ?HPI: Patient is a 76 y.o. female for follow on mood and sleep. ? ?Trazodone- Take 50 mg and 1/2 Tylenol PM. Ptates the medication has been helping with sleep but not with feeling sad. Does not want to try any other medication. Would like to try to go up on the trazodone.  ? ?Goes to Grief counseling.  ? ?Attends Tai Chi class for seniors and joined widows' group.  ? ?Has also been walking with a neighbor for exercise.  ? ?Neuropathy- button of feet burns. Use to take Gabapentin. Now using tylenol at bedtime.  ? ?Hypothyroidism- Takes Synthroid 75 mcg, no issues or concerns.  ? ? Pure hypercholesteremia- Takes Zetia every morning, no issues or concerns. ? ?Review of Systems:  ?Review of Systems  ?Constitutional:  Negative for chills, fever, malaise/fatigue and weight loss.  ?Respiratory: Negative.   Negative for cough and shortness of breath.   ?Cardiovascular:  Negative for chest pain, palpitations, orthopnea and leg swelling.  ?Gastrointestinal:  Negative for abdominal pain, constipation, diarrhea, heartburn, nausea and vomiting.  ?Genitourinary:  Negative for dysuria, frequency and urgency.  ?Neurological:  Positive for tingling.  ?     Burning in the soles of feet   ?Psychiatric/Behavioral:  Positive for depression. Negative for suicidal ideas. The patient is not nervous/anxious and does not have insomnia.   ? ?Past Medical History:  ?Diagnosis Date  ? Aphasia   ? Arthritis   ? OA- fingers, toes, back pain, hip pain   ? Breast cancer of upper-outer quadrant of left female breast ([place])   ? left breast cancer  ? Complication of anesthesia   ? slow to wake up  ? Disorder of bone and cartilage, unspecified   ? Diverticulitis   ? Hypothyroid   ? Lumbago   ? Malignant neoplasm of breast (female), unspecified site   ? Normal nuclear stress test   ? [facility], 07/18/2011  ? Osteoarthritis (arthritis due to wear and tear of joints)   ? Pain in joint, pelvic region and thigh   ? Reflux esophagitis   ? pt. knowledgeable about dietary intake relative to reflux  ? S/P bilateral breast biopsy 2013  ? Shortness of breath   ? /w housework  ? Symptomatic menopausal or female climacteric states   ? ?Past Surgical History:  ?Procedure Laterality Date  ? APPENDECTOMY    ?  BREAST BIOPSY  07/26/2011  ? Procedure: BREAST BIOPSY WITH NEEDLE LOCALIZATION;  Surgeon: Rolm Bookbinder, MD;  Location: [place];  Service: General;  Laterality: Right;  Right breast wire guided biospy Needle localization @ [facility]  7:30  ? BREAST SURGERY    ? Procedure: BREAST BIOPSY WITH NEEDLE LOCALIZATION;  Sur  ? CATARACT EXTRACTION  2012  ? [place] OF UTERUS  1978  ? ECTOPIC PREGNANCY SURGERY    ? 1980  ? EYE SURGERY    ? R cat. extracted /w IOL  ? LAPAROSCOPIC APPENDECTOMY N/A 03/03/2019  ? Procedure: APPENDECTOMY LAPAROSCOPIC;  Surgeon:  Armandina Gemma, MD;  Location: [facility];  Service: General;  Laterality: N/A;  ? OVARIAN CYST SURGERY    ? TONSILLECTOMY    ? as a child  ? ?Social History: ?  reports that she quit smoking about 41 years ago. Her smoking use included cigarettes. She smoked an average of .25 packs per day. She has never used smokeless tobacco. She reports that she does not drink alcohol and does not use drugs. ? ?Family History  ?Problem Relation Age of Onset  ? Cancer Sister   ?     colon  ? Cancer Brother   ?     renal  ? Heart disease Brother   ? Cancer Mother   ?     leukemia  ? Peripheral vascular disease Mother   ? Cancer Father   ?     lung  ? Heart attack Brother   ? Cancer Maternal Aunt   ?     breast  ? Cancer Cousin   ? Cancer Cousin   ? Anesthesia problems Neg Hx   ? ? ?Medications: ?Patient's Medications  ?New Prescriptions  ? No medications on file  ?Previous Medications  ? ACETAMINOPHEN (TYLENOL) 500 MG TABLET    Take 2 tablets (1,000 mg total) by mouth every 8 (eight) hours as needed for mild pain.  ? CALCIUM CITRATE PO    Take 2 tablets by mouth in the morning and at bedtime. Totaling 1260 mg daily  ? CHOLECALCIFEROL (VITAMIN D3) 25 MCG (1000 UT) CAPS    Take by mouth daily.  ? DIPHENHYDRAMINE-ACETAMINOPHEN (TYLENOL PM) 25-500 MG TABS TABLET    Take 1 tablet by mouth at bedtime.  ? EZETIMIBE (ZETIA) 10 MG TABLET    TAKE 1 TABLET BY MOUTH EVERY DAY  ? IBUPROFEN (ADVIL,MOTRIN) 100 MG TABLET    Take 400 mg by mouth every 6 (six) hours as needed for pain.   ? LEVOTHYROXINE (SYNTHROID) 75 MCG TABLET    Take 1 tablet (75 mcg total) by mouth daily before breakfast.  ? TRAZODONE (DESYREL) 50 MG TABLET    TAKE 0.5-1 TABLETS BY MOUTH AT BEDTIME AS NEEDED FOR SLEEP.  ? VITAMIN E 400 UNIT CAPSULE    Take 800 Units by mouth daily.   ?Modified Medications  ? No medications on file  ?Discontinued Medications  ? No medications on file  ? ? ?Physical Exam: ? ?Vitals:  ? 07/20/21 1103  ?BP: 118/72  ?Pulse: 66  ?Temp: (!) 97.3 ?F (36.3  ?C)  ?TempSrc: Temporal  ?SpO2: 97%  ?Weight: 152 lb (68.9 kg)  ?Height: 5' 2"  (1.575 m)  ? ?There is no height or weight on file to calculate BMI. ?Wt Readings from Last 3 Encounters:  ?06/04/21 150 lb 9.6 oz (68.3 kg)  ?04/13/21 151 lb (68.5 kg)  ?10/11/20 148 lb (67.1 kg)  ? ? ?Physical Exam ?  Constitutional:   ?   Appearance: She is normal weight.  ?Cardiovascular:  ?   Rate and Rhythm: Normal rate.  ?   Pulses: Normal pulses.  ?   Heart sounds: Normal heart sounds.  ?Pulmonary:  ?   Effort: Pulmonary effort is normal.  ?   Breath sounds: Normal breath sounds.  ?Abdominal:  ?   General: Abdomen is flat. Bowel sounds are normal.  ?   Palpations: Abdomen is soft.  ?Neurological:  ?   Mental Status: She is oriented to person, place, and time.  ?   Motor: No weakness.  ?   Gait: Gait normal.  ?Psychiatric:     ?   Mood and Affect: Mood normal.     ?   Behavior: Behavior normal.     ?   Thought Content: Thought content normal.     ?   Judgment: Judgment normal.  ? ? ?Labs reviewed: ?Basic Metabolic Panel: ?Recent Labs  ?  10/11/20 ?1033 06/04/21 ?1007  ?NA 138 140  ?K 4.2 4.2  ?CL 104 107  ?CO2 25 28  ?GLUCOSE 85 82  ?BUN 13 13  ?CREATININE 0.78 0.80  ?CALCIUM 8.8 8.8  ?TSH 2.79 1.10  ? ?Liver Function Tests: ?Recent Labs  ?  10/11/20 ?1033 06/04/21 ?1007  ?AST 17 15  ?ALT 11 10  ?BILITOT 0.6 0.6  ?PROT 6.7 6.8  ? ?No results for input(s): LIPASE, AMYLASE in the last 8760 hours. ?No results for input(s): AMMONIA in the last 8760 hours. ?CBC: ?Recent Labs  ?  10/11/20 ?1033 06/04/21 ?1007  ?WBC 3.9 3.6*  ?NEUTROABS 2,067 1,832  ?HGB 14.2 14.0  ?HCT 42.0 41.8  ?MCV 93.1 93.7  ?PLT 249 212  ? ?Lipid Panel: ?Recent Labs  ?  10/11/20 ?1033 06/04/21 ?1007  ?CHOL 202* 198  ?HDL 65 60  ?LDLCALC 112* 114*  ?TRIG 134 129  ?CHOLHDL 3.1 3.3  ? ?TSH: ?Recent Labs  ?  10/11/20 ?1033 06/04/21 ?1007  ?TSH 2.79 1.10  ? ?A1C: ?No results found for: HGBA1C ? ? ?Assessment/Plan ?1. Insomnia, unspecified type ?- Managed with Trazodone 50  mg with 250 mg of tylenol PM. However she feels like sleep could improve.  ?-encouraged stopping Tylenol Pm and increased Trazodone to 100 mg HS  ?-continue lifestyle modifications.  ?-Start TraZODone (DESYREL)

## 2021-07-20 NOTE — Patient Instructions (Signed)
Increase trazodone 100 mg daily  ? ?Continue the b12 ?

## 2021-09-10 ENCOUNTER — Encounter: Payer: Self-pay | Admitting: Nurse Practitioner

## 2021-09-15 ENCOUNTER — Other Ambulatory Visit: Payer: Self-pay | Admitting: Nurse Practitioner

## 2021-09-15 DIAGNOSIS — E039 Hypothyroidism, unspecified: Secondary | ICD-10-CM

## 2021-10-14 ENCOUNTER — Other Ambulatory Visit: Payer: Self-pay | Admitting: Nurse Practitioner

## 2021-10-14 DIAGNOSIS — E7800 Pure hypercholesterolemia, unspecified: Secondary | ICD-10-CM

## 2021-10-18 ENCOUNTER — Encounter: Payer: Self-pay | Admitting: Nurse Practitioner

## 2021-11-23 ENCOUNTER — Ambulatory Visit (INDEPENDENT_AMBULATORY_CARE_PROVIDER_SITE_OTHER): Payer: Medicare Other | Admitting: Nurse Practitioner

## 2021-11-23 ENCOUNTER — Encounter: Payer: Self-pay | Admitting: Nurse Practitioner

## 2021-11-23 VITALS — BP 136/74 | HR 68 | Temp 97.4°F | Ht 62.0 in | Wt 151.0 lb

## 2021-11-23 DIAGNOSIS — G629 Polyneuropathy, unspecified: Secondary | ICD-10-CM | POA: Diagnosis not present

## 2021-11-23 DIAGNOSIS — L723 Sebaceous cyst: Secondary | ICD-10-CM | POA: Diagnosis not present

## 2021-11-23 DIAGNOSIS — E039 Hypothyroidism, unspecified: Secondary | ICD-10-CM | POA: Diagnosis not present

## 2021-11-23 DIAGNOSIS — E7800 Pure hypercholesterolemia, unspecified: Secondary | ICD-10-CM | POA: Diagnosis not present

## 2021-11-23 DIAGNOSIS — M8589 Other specified disorders of bone density and structure, multiple sites: Secondary | ICD-10-CM

## 2021-11-23 DIAGNOSIS — G4700 Insomnia, unspecified: Secondary | ICD-10-CM

## 2021-11-23 MED ORDER — TRAZODONE HCL 50 MG PO TABS: 75.0000 mg | ORAL_TABLET | Freq: Every day | ORAL | 0 refills | Status: DC

## 2021-11-23 NOTE — Progress Notes (Signed)
Careteam: Patient Care Team: Lauree Chandler, NP as PCP - General (Geriatric Medicine) Warren Danes, PA-C as Physician Assistant (Dermatology) Magrinat, Virgie Dad, MD (Inactive) as Consulting Physician (Oncology) Wilford Corner, MD as Consulting Physician (Gastroenterology)  PLACE OF SERVICE:  [place] Directive information Does Patient Have a Medical Advance Directive?: Yes, Type of Advance Directive: [place];Living will, Does patient want to make changes to medical advance directive?: No - Patient declined  Allergies  Allergen Reactions   Benadryl [Diphenhydramine Hcl] Other (See Comments)    "Super hyper"   Tape     Skin breakdown/redness    Boniva [Ibandronate Sodium] Other (See Comments)    Neck, shoulder limited mobility    Peridin-C [Ascorbic Acid] Diarrhea   Venlafaxine Nausea Only   Chlorhexidine Gluconate Itching   Codeine Other (See Comments)    "can't wake up from it."   Sulfa Antibiotics Rash    Patient unsure of reaction    Chief Complaint  Patient presents with   Medical Management of Chronic Issues    4 month follow up. Patient refused covid vaccine recommendation, stating she has no plans to receive any additional covid vaccines. Discuss Trazodone, patient is taking 1/2 of a 50 mg tablet and 1/2 of a 100 mg tablet, totaling 75 mg daily. Patient states 100 mg trazodone is too strong.Marland Kitchen      HPI: Patient is a 76 y.o. female for routine follow up.   Insomnia- doing okay on trazodone, 100 mg was too much. She is taking 75 mg   Hyperlipidemia- on zetia.   Osteopenia- on osteobiflex   Arthritis- stable, taking glucosamine which has been good for her joints.   Continues to move through grief, has been keeping herself busy this week.   Review of Systems:  Review of Systems  Constitutional:  Negative for chills, fever and weight loss.  HENT:  Negative for tinnitus.   Respiratory:  Negative for cough, sputum  production and shortness of breath.   Cardiovascular:  Negative for chest pain, palpitations and leg swelling.  Gastrointestinal:  Negative for abdominal pain, constipation, diarrhea and heartburn.  Genitourinary:  Negative for dysuria, frequency and urgency.  Musculoskeletal:  Negative for back pain, falls, joint pain and myalgias.  Skin: Negative.   Neurological:  Negative for dizziness and headaches.  Psychiatric/Behavioral:  Negative for depression and memory loss. The patient does not have insomnia.     Past Medical History:  Diagnosis Date   Aphasia    Arthritis    OA- fingers, toes, back pain, hip pain    Breast cancer of upper-outer quadrant of left female breast ([facility])    left breast cancer   Complication of anesthesia    slow to wake up   Disorder of bone and cartilage, unspecified    Diverticulitis    Hypothyroid    Lumbago    Malignant neoplasm of breast (female), unspecified site    Normal nuclear stress test    [facility], 07/18/2011   Osteoarthritis (arthritis due to wear and tear of joints)    Pain in joint, pelvic region and thigh    Reflux esophagitis    pt. knowledgeable about dietary intake relative to reflux   S/P bilateral breast biopsy 2013   Shortness of breath    /w housework   Symptomatic menopausal or female climacteric states    Past Surgical History:  Procedure Laterality Date   APPENDECTOMY     BREAST BIOPSY  07/26/2011   Procedure: BREAST BIOPSY WITH NEEDLE LOCALIZATION;  Surgeon: Rolm Bookbinder, MD;  Location: [place];  Service: General;  Laterality: Right;  Right breast wire guided biospy Needle localization @ [facility]  7:30   BREAST SURGERY     Procedure: BREAST BIOPSY WITH NEEDLE LOCALIZATION;  [place]   CATARACT EXTRACTION  2012   DILATION AND CURETTAGE OF UTERUS  1978   ECTOPIC PREGNANCY SURGERY     1980   EYE SURGERY     R cat. extracted /w IOL   LAPAROSCOPIC APPENDECTOMY N/A 03/03/2019   Procedure: APPENDECTOMY LAPAROSCOPIC;  Surgeon:  Armandina Gemma, MD;  Location: [facility];  Service: General;  Laterality: N/A;   OVARIAN CYST SURGERY     TONSILLECTOMY     as a child   Social History:   reports that she quit smoking about 41 years ago. Her smoking use included cigarettes. She smoked an average of .25 packs per day. She has never used smokeless tobacco. She reports that she does not drink alcohol and does not use drugs.  Family History  Problem Relation Age of Onset   Cancer Sister        colon   Cancer Brother        renal   Heart disease Brother    Cancer Mother        leukemia   Peripheral vascular disease Mother    Cancer Father        lung   Heart attack Brother    Cancer Maternal Aunt        breast   Cancer Cousin    Cancer Cousin    Anesthesia problems Neg Hx     Medications: Patient's Medications  New Prescriptions   No medications on file  Previous Medications   ACETAMINOPHEN (TYLENOL) 500 MG TABLET    Take 2 tablets (1,000 mg total) by mouth every 8 (eight) hours as needed for mild pain.   BOSWELLIA-GLUCOSAMINE-VIT D (OSTEO BI-FLEX ONE PER DAY PO)    Take 1 tablet by mouth daily.   CHOLECALCIFEROL (D3 5000) 125 MCG (5000 UT) CAPSULE    Take 5,000 Units by mouth daily.   CYANOCOBALAMIN (VITAMIN B-12) 1000 MCG SUBL    Place 1 tablet under the tongue daily. At breakfast   EZETIMIBE (ZETIA) 10 MG TABLET    TAKE 1 TABLET BY MOUTH EVERY DAY   SYNTHROID 75 MCG TABLET    TAKE 1 TABLET BY MOUTH DAILY BEFORE BREAKFAST.   TRAZODONE (DESYREL) 100 MG TABLET    Take 1 tablet (100 mg total) by mouth at bedtime.   VITAMIN E 400 UNIT CAPSULE    Take 800 Units by mouth daily.   Modified Medications   No medications on file  Discontinued Medications   CALCIUM CITRATE PO    Take 2 tablets by mouth in the morning and at bedtime. Totaling 1260 mg daily   CHOLECALCIFEROL (VITAMIN D3) 25 MCG (1000 UT) CAPS    Take by mouth daily.   DIPHENHYDRAMINE-ACETAMINOPHEN (TYLENOL PM) 25-500 MG TABS TABLET    Take 0.5 tablets by  mouth at bedtime.   IBUPROFEN (ADVIL,MOTRIN) 100 MG TABLET    Take 400 mg by mouth every 6 (six) hours as needed for pain.     Physical Exam:  Vitals:   11/23/21 1128  BP: 136/74  Pulse: 68  Temp: (!) 97.4 F (36.3 C)  TempSrc: Temporal  SpO2: 97%  Weight: 151 lb (68.5 kg)  Height: _0  (1.575 m)  Body mass index is 27.62 kg/m. Wt Readings from Last 3 Encounters:  11/23/21 151 lb (68.5 kg)  07/20/21 152 lb (68.9 kg)  06/04/21 150 lb 9.6 oz (68.3 kg)    Physical Exam Constitutional:      General: She is not in acute distress.    Appearance: She is well-developed. She is not diaphoretic.  HENT:     Head: Normocephalic and atraumatic.     Mouth/Throat:     Pharynx: No oropharyngeal exudate.  Eyes:     Conjunctiva/sclera: Conjunctivae normal.     Pupils: Pupils are equal, round, and reactive to light.  Cardiovascular:     Rate and Rhythm: Normal rate and regular rhythm.     Heart sounds: Normal heart sounds.  Pulmonary:     Effort: Pulmonary effort is normal.     Breath sounds: Normal breath sounds.  Abdominal:     General: Bowel sounds are normal.     Palpations: Abdomen is soft.  Musculoskeletal:     Cervical back: Normal range of motion and neck supple.     Right lower leg: No edema.     Left lower leg: No edema.  Skin:    General: Skin is warm and dry.  Neurological:     Mental Status: She is alert.  Psychiatric:        Mood and Affect: Mood normal.     Labs reviewed: Basic Metabolic Panel: Recent Labs    06/04/21 1007  NA 140  K 4.2  CL 107  CO2 28  GLUCOSE 82  BUN 13  CREATININE 0.80  CALCIUM 8.8  TSH 1.10   Liver Function Tests: Recent Labs    06/04/21 1007  AST 15  ALT 10  BILITOT 0.6  PROT 6.8   No results for input(s): "LIPASE", "AMYLASE" in the last 8760 hours. No results for input(s): "AMMONIA" in the last 8760 hours. CBC: Recent Labs    06/04/21 1007  WBC 3.6*  NEUTROABS 1,832  HGB 14.0  HCT 41.8  MCV 93.7  PLT  212   Lipid Panel: Recent Labs    06/04/21 1007  CHOL 198  HDL 60  LDLCALC 114*  TRIG 129  CHOLHDL 3.3   TSH: Recent Labs    06/04/21 1007  TSH 1.10   A1C: No results found for: "HGBA1C"   Assessment/Plan 1. Insomnia, unspecified type Well controlled on trazodone 75 mg daily - traZODone (DESYREL) 50 MG tablet; Take 1.5 tablets (75 mg total) by mouth at bedtime.  Dispense: 45 tablet; Refill: 0  2. Neuropathy -stable at this time, without worsening symptoms  3. Acquired hypothyroidism -continues on synthroid.  - TSH; Future  4. Pure hypercholesterolemia Continues on zetia with dietary modifications.  - CMP with eGFR([facility]); Future - CBC with Differential/Platelet; Future - Lipid panel; Future  5. Osteopenia of multiple sites -Recommended to take calcium 600 mg twice daily with Vitamin D 2000 units daily and weight bearing activity 30 mins/5 days a week - CMP with eGFR([facility]); Future  6. Sebaceous cyst 2 cyst noted on back with opening. To use warm compress and massage. To notify if become red, warm or painful.    Return in about 4 months (around 03/26/2022) for routine follow up, labs prior . Carlos American. [place], [facility] [phone]

## 2021-11-23 NOTE — Patient Instructions (Signed)
To use warm compress to back 2-3 times daily  Massage

## 2022-01-20 ENCOUNTER — Other Ambulatory Visit: Payer: Self-pay | Admitting: Nurse Practitioner

## 2022-01-20 DIAGNOSIS — G4700 Insomnia, unspecified: Secondary | ICD-10-CM

## 2022-01-22 DIAGNOSIS — H35372 Puckering of macula, left eye: Secondary | ICD-10-CM | POA: Diagnosis not present

## 2022-01-22 DIAGNOSIS — H524 Presbyopia: Secondary | ICD-10-CM | POA: Diagnosis not present

## 2022-01-23 ENCOUNTER — Encounter: Payer: Self-pay | Admitting: Nurse Practitioner

## 2022-01-24 ENCOUNTER — Ambulatory Visit (INDEPENDENT_AMBULATORY_CARE_PROVIDER_SITE_OTHER): Payer: Medicare Other | Admitting: Orthopedic Surgery

## 2022-01-24 ENCOUNTER — Other Ambulatory Visit: Payer: Self-pay

## 2022-01-24 ENCOUNTER — Encounter: Payer: Self-pay | Admitting: Orthopedic Surgery

## 2022-01-24 VITALS — BP 130/68 | HR 80 | Temp 97.9°F | Resp 17 | Ht 62.0 in | Wt 150.0 lb

## 2022-01-24 DIAGNOSIS — R300 Dysuria: Secondary | ICD-10-CM

## 2022-01-24 DIAGNOSIS — R350 Frequency of micturition: Secondary | ICD-10-CM | POA: Diagnosis not present

## 2022-01-24 LAB — POCT URINALYSIS DIP (MANUAL ENTRY)
Bilirubin, UA: NEGATIVE
Blood, UA: NEGATIVE
Glucose, UA: NEGATIVE mg/dL
Ketones, POC UA: NEGATIVE mg/dL
Nitrite, UA: NEGATIVE
Protein Ur, POC: NEGATIVE mg/dL
Spec Grav, UA: 1.01 (ref 1.010–1.025)
Urobilinogen, UA: 0.2 E.U./dL
pH, UA: 6.5 (ref 5.0–8.0)

## 2022-01-24 NOTE — Progress Notes (Signed)
Careteam: Patient Care Team: Lauree Chandler, NP as PCP - General (Geriatric Medicine) Warren Danes, PA-C as Physician Assistant (Dermatology) Magrinat, Virgie Dad, MD (Inactive) as Consulting Physician (Oncology) Wilford Corner, MD as Consulting Physician (Gastroenterology)  Seen by: Windell Moulding, AGNP-C  PLACE OF SERVICE:  [place] Directive information Does Patient Have a Medical Advance Directive?: Yes, Type of Advance Directive: [place];Living will  Allergies  Allergen Reactions   Benadryl [Diphenhydramine Hcl] Other (See Comments)    "Super hyper"   Tape     Skin breakdown/redness    Boniva [Ibandronate Sodium] Other (See Comments)    Neck, shoulder limited mobility    Peridin-C [Ascorbic Acid] Diarrhea   Venlafaxine Nausea Only   Chlorhexidine Gluconate Itching   Codeine Other (See Comments)    "can't wake up from it."   Sulfa Antibiotics Rash    Patient unsure of reaction    Chief Complaint  Patient presents with   Acute Visit    Patient has been experiencing frequent urination every 10-15 minutes. And some pain in stomach     HPI: Patient is a 76 y.o. female seen today for acute visit due to urinary frequency.   Increased urinary frequency with intermittent dysuria x 1 day. Also reports lower abdominal bloating/discomfort. Denies fever or CVA tenderness. She admits to not drinking water well. H/o UTI in past, no recent episodes.   Review of Systems:  Review of Systems  Constitutional:  Negative for chills, fever and malaise/fatigue.  Respiratory:  Negative for cough, shortness of breath and wheezing.   Cardiovascular:  Negative for chest pain and leg swelling.  Gastrointestinal:  Positive for abdominal pain.       Bloating  Genitourinary:  Positive for dysuria and frequency. Negative for flank pain and hematuria.  Psychiatric/Behavioral:  Negative for depression. The patient is not nervous/anxious.     Past  Medical History:  Diagnosis Date   Aphasia    Arthritis    OA- fingers, toes, back pain, hip pain    Breast cancer of upper-outer quadrant of left female breast (HCC)    left breast cancer   Complication of anesthesia    slow to wake up   Disorder of bone and cartilage, unspecified    Diverticulitis    Hypothyroid    Lumbago    Malignant neoplasm of breast (female), unspecified site    Normal nuclear stress test    [facility], 07/18/2011   Osteoarthritis (arthritis due to wear and tear of joints)    Pain in joint, pelvic region and thigh    Reflux esophagitis    pt. knowledgeable about dietary intake relative to reflux   S/P bilateral breast biopsy 2013   Shortness of breath    /w housework   Symptomatic menopausal or female climacteric states    Past Surgical History:  Procedure Laterality Date   APPENDECTOMY     BREAST BIOPSY  07/26/2011   Procedure: BREAST BIOPSY WITH NEEDLE LOCALIZATION;  Surgeon: Rolm Bookbinder, MD;  Location: [place];  Service: General;  Laterality: Right;  Right breast wire guided biospy Needle localization @ [facility]  7:30   BREAST SURGERY     Procedure: BREAST BIOPSY WITH NEEDLE LOCALIZATION;  [place]   CATARACT EXTRACTION  2012   DILATION AND CURETTAGE OF [place]     R cat. extracted /w IOL  LAPAROSCOPIC APPENDECTOMY N/A 03/03/2019   Procedure: APPENDECTOMY LAPAROSCOPIC;  Surgeon: Armandina Gemma, MD;  Location: [facility];  Service: General;  Laterality: N/A;   OVARIAN CYST SURGERY     TONSILLECTOMY     as a child   Social History:   reports that she quit smoking about 41 years ago. Her smoking use included cigarettes. She smoked an average of .25 packs per day. She has never used smokeless tobacco. She reports that she does not drink alcohol and does not use drugs.  Family History  Problem Relation Age of Onset   Cancer Sister        colon   Cancer Brother        renal   Heart disease Brother     Cancer Mother        leukemia   Peripheral vascular disease Mother    Cancer Father        lung   Heart attack Brother    Cancer Maternal Aunt        breast   Cancer Cousin    Cancer Cousin    Anesthesia problems Neg Hx     Medications: Patient's Medications  New Prescriptions   No medications on file  Previous Medications   ACETAMINOPHEN (TYLENOL) 500 MG TABLET    Take 2 tablets (1,000 mg total) by mouth every 8 (eight) hours as needed for mild pain.   BOSWELLIA-GLUCOSAMINE-VIT D (OSTEO BI-FLEX ONE PER DAY PO)    Take 1 tablet by mouth daily.   CHOLECALCIFEROL (D3 5000) 125 MCG (5000 UT) CAPSULE    Take 5,000 Units by mouth daily.   CYANOCOBALAMIN (VITAMIN B-12) 1000 MCG SUBL    Place 1 tablet under the tongue daily. At breakfast   EZETIMIBE (ZETIA) 10 MG TABLET    TAKE 1 TABLET BY MOUTH EVERY DAY   SYNTHROID 75 MCG TABLET    TAKE 1 TABLET BY MOUTH DAILY BEFORE BREAKFAST.   TRAZODONE (DESYREL) 50 MG TABLET    Take 1.5 tablets (75 mg total) by mouth at bedtime.   VITAMIN E 400 UNIT CAPSULE    Take 800 Units by mouth daily.   Modified Medications   No medications on file  Discontinued Medications   No medications on file    Physical Exam:  Vitals:   01/24/22 1304  BP: 130/68  Pulse: 80  Resp: 17  Temp: 97.9 F (36.6 C)  TempSrc: Temporal  SpO2: 98%  Weight: 150 lb (68 kg)  Height: '5\' 2"'$  (1.575 m)   Body mass index is 27.44 kg/m. Wt Readings from Last 3 Encounters:  01/24/22 150 lb (68 kg)  11/23/21 151 lb (68.5 kg)  07/20/21 152 lb (68.9 kg)    Physical Exam Vitals reviewed.  Constitutional:      General: She is not in acute distress. Eyes:     General:        Right eye: No discharge.        Left eye: No discharge.  Cardiovascular:     Rate and Rhythm: Normal rate and regular rhythm.     Pulses: Normal pulses.     Heart sounds: Normal heart sounds.  Pulmonary:     Effort: Pulmonary effort is normal.     Breath sounds: Normal breath sounds.   Abdominal:     General: Bowel sounds are normal. There is no distension.     Palpations: Abdomen is soft.     Tenderness: There is no abdominal tenderness. There is no  right CVA tenderness or left CVA tenderness.  Skin:    General: Skin is warm and dry.     Capillary Refill: Capillary refill takes less than 2 seconds.  Neurological:     General: No focal deficit present.     Mental Status: She is alert and oriented to person, place, and time.  Psychiatric:        Behavior: Behavior normal.     Labs reviewed: Basic Metabolic Panel: Recent Labs    06/04/21 1007  NA 140  K 4.2  CL 107  CO2 28  GLUCOSE 82  BUN 13  CREATININE 0.80  CALCIUM 8.8  TSH 1.10   Liver Function Tests: Recent Labs    06/04/21 1007  AST 15  ALT 10  BILITOT 0.6  PROT 6.8   No results for input(s): "LIPASE", "AMYLASE" in the last 8760 hours. No results for input(s): "AMMONIA" in the last 8760 hours. CBC: Recent Labs    06/04/21 1007  WBC 3.6*  NEUTROABS 1,832  HGB 14.0  HCT 41.8  MCV 93.7  PLT 212   Lipid Panel: Recent Labs    06/04/21 1007  CHOL 198  HDL 60  LDLCALC 114*  TRIG 129  CHOLHDL 3.3   TSH: Recent Labs    06/04/21 1007  TSH 1.10   A1C: No results found for: "HGBA1C"   Assessment/Plan 1. Urinary frequency - onset x 1 day - UA with cloudy appearance, +1 leukocytes - afebrile, no cva tenderness - does not drink water well - Recommended AZO for dysuria - encourage hydration with water - POCT urinalysis dipstick - Culture, Urine  2. Dysuria - see above  Total time: 15 minutes. Greater than 50% of total time spent doing patient education regarding urinary frequency including symptom/medication management.    Next appt: Visit date not found  [place], [facility] [phone]

## 2022-01-24 NOTE — Patient Instructions (Signed)
Consider taking AZO (pyridium) over the counter while urine culture is pending.   Encourage hydration with water.

## 2022-01-26 LAB — URINE CULTURE
MICRO NUMBER:: 13982120
SPECIMEN QUALITY:: ADEQUATE

## 2022-03-14 ENCOUNTER — Other Ambulatory Visit: Payer: Self-pay | Admitting: Nurse Practitioner

## 2022-03-14 DIAGNOSIS — E039 Hypothyroidism, unspecified: Secondary | ICD-10-CM

## 2022-03-18 ENCOUNTER — Other Ambulatory Visit: Payer: Self-pay

## 2022-03-18 DIAGNOSIS — M8589 Other specified disorders of bone density and structure, multiple sites: Secondary | ICD-10-CM

## 2022-03-18 DIAGNOSIS — E7800 Pure hypercholesterolemia, unspecified: Secondary | ICD-10-CM

## 2022-03-18 DIAGNOSIS — E039 Hypothyroidism, unspecified: Secondary | ICD-10-CM

## 2022-03-20 ENCOUNTER — Ambulatory Visit (INDEPENDENT_AMBULATORY_CARE_PROVIDER_SITE_OTHER): Payer: Medicare Other

## 2022-03-20 ENCOUNTER — Other Ambulatory Visit: Payer: Medicare Other

## 2022-03-20 DIAGNOSIS — Z23 Encounter for immunization: Secondary | ICD-10-CM

## 2022-03-20 DIAGNOSIS — M8589 Other specified disorders of bone density and structure, multiple sites: Secondary | ICD-10-CM | POA: Diagnosis not present

## 2022-03-20 DIAGNOSIS — E039 Hypothyroidism, unspecified: Secondary | ICD-10-CM | POA: Diagnosis not present

## 2022-03-20 DIAGNOSIS — E7800 Pure hypercholesterolemia, unspecified: Secondary | ICD-10-CM | POA: Diagnosis not present

## 2022-03-21 LAB — COMPLETE METABOLIC PANEL WITH GFR
AG Ratio: 1.6 (calc) (ref 1.0–2.5)
ALT: 9 U/L (ref 6–29)
AST: 14 U/L (ref 10–35)
Albumin: 4.4 g/dL (ref 3.6–5.1)
Alkaline phosphatase (APISO): 51 U/L (ref 37–153)
BUN: 14 mg/dL (ref 7–25)
CO2: 28 mmol/L (ref 20–32)
Calcium: 9.2 mg/dL (ref 8.6–10.4)
Chloride: 108 mmol/L (ref 98–110)
Creat: 0.81 mg/dL (ref 0.60–1.00)
Globulin: 2.7 g/dL (calc) (ref 1.9–3.7)
Glucose, Bld: 88 mg/dL (ref 65–99)
Potassium: 5 mmol/L (ref 3.5–5.3)
Sodium: 143 mmol/L (ref 135–146)
Total Bilirubin: 0.7 mg/dL (ref 0.2–1.2)
Total Protein: 7.1 g/dL (ref 6.1–8.1)
eGFR: 75 mL/min/{1.73_m2} (ref >=60)

## 2022-03-21 LAB — CBC WITH DIFFERENTIAL/PLATELET
Absolute Monocytes: 280 cells/uL (ref 200–950)
Basophils Absolute: 32 cells/uL (ref 0–200)
Basophils Relative: 0.9 %
Eosinophils Absolute: 109 cells/uL (ref 15–500)
Eosinophils Relative: 3.1 %
HCT: 41.9 % (ref 35.0–45.0)
Hemoglobin: 14.3 g/dL (ref 11.7–15.5)
Lymphs Abs: 1421 cells/uL (ref 850–3900)
MCH: 31.7 pg (ref 27.0–33.0)
MCHC: 34.1 g/dL (ref 32.0–36.0)
MCV: 92.9 fL (ref 80.0–100.0)
MPV: 9.6 fL (ref 7.5–12.5)
Monocytes Relative: 8 %
Neutro Abs: 1659 cells/uL (ref 1500–7800)
Neutrophils Relative %: 47.4 %
Platelets: 236 10*3/uL (ref 140–400)
RBC: 4.51 10*6/uL (ref 3.80–5.10)
RDW: 12.5 % (ref 11.0–15.0)
Total Lymphocyte: 40.6 %
WBC: 3.5 10*3/uL — ABNORMAL LOW (ref 3.8–10.8)

## 2022-03-21 LAB — LIPID PANEL
Cholesterol: 208 mg/dL — ABNORMAL HIGH (ref <200)
HDL: 71 mg/dL (ref >=50)
LDL Cholesterol (Calc): 115 mg/dL (calc) — ABNORMAL HIGH
Non-HDL Cholesterol (Calc): 137 mg/dL (calc) — ABNORMAL HIGH (ref <130)
Total CHOL/HDL Ratio: 2.9 (calc) (ref <5.0)
Triglycerides: 111 mg/dL (ref <150)

## 2022-03-21 LAB — TSH: TSH: 5.78 mIU/L — ABNORMAL HIGH (ref 0.40–4.50)

## 2022-03-25 ENCOUNTER — Ambulatory Visit (INDEPENDENT_AMBULATORY_CARE_PROVIDER_SITE_OTHER): Payer: Medicare Other | Admitting: Nurse Practitioner

## 2022-03-25 ENCOUNTER — Encounter: Payer: Self-pay | Admitting: Nurse Practitioner

## 2022-03-25 VITALS — BP 134/72 | HR 70 | Temp 97.1°F | Ht 62.0 in | Wt 150.0 lb

## 2022-03-25 DIAGNOSIS — E7800 Pure hypercholesterolemia, unspecified: Secondary | ICD-10-CM

## 2022-03-25 DIAGNOSIS — G4700 Insomnia, unspecified: Secondary | ICD-10-CM | POA: Diagnosis not present

## 2022-03-25 DIAGNOSIS — F4321 Adjustment disorder with depressed mood: Secondary | ICD-10-CM

## 2022-03-25 DIAGNOSIS — E039 Hypothyroidism, unspecified: Secondary | ICD-10-CM

## 2022-03-25 DIAGNOSIS — M5431 Sciatica, right side: Secondary | ICD-10-CM

## 2022-03-25 NOTE — Patient Instructions (Addendum)
To take aleve 1 tablet by mouth twice daily for 5 days   To get xray of back when you are able.

## 2022-03-25 NOTE — Progress Notes (Signed)
Careteam: Patient Care Team: Lauree Chandler, NP as PCP - General (Geriatric Medicine) Warren Danes, PA-C as Physician Assistant (Dermatology) Magrinat, Virgie Dad, MD (Inactive) as Consulting Physician (Oncology) Wilford Corner, MD as Consulting Physician (Gastroenterology)  PLACE OF SERVICE:  [place]  Advanced Directive information    Allergies  Allergen Reactions   Benadryl [Diphenhydramine Hcl] Other (See Comments)    "Super hyper"   Tape     Skin breakdown/redness    Boniva [Ibandronate Sodium] Other (See Comments)    Neck, shoulder limited mobility    Peridin-C [Ascorbic Acid] Diarrhea   Venlafaxine Nausea Only   Chlorhexidine Gluconate Itching   Codeine Other (See Comments)    "can't wake up from it."   Sulfa Antibiotics Rash    Patient unsure of reaction    Chief Complaint  Patient presents with   Follow-up    4 month follow up   Leg Pain    Right side pain, denies fall     HPI: Patient is a 76 y.o. female for routine follow up.   She has gone back into grief counseling she reports she does good during the week but having meltdown during the weekend. Daughter is ordering her some things online for brain health. Therapist recommended starting medication but she really does not want to   She is sleeping well, came off trazodone about a month ago. No worsening of depression since she's been off. Has always been hard.   In October she started having pain going down right leg.  Not hip. Hard going up steps, wobbly on feet.  Has not taken any medication for it.  Used tylenol once because she could not sleep.  Now pain 4/10 when she walking.  Walking up and down steps makes it worse  Shooting stabbing pain.  Unable to reproduce pain.   Review of Systems:  Review of Systems  Constitutional:  Negative for chills, fever and weight loss.  HENT:  Negative for tinnitus.   Respiratory:  Negative for cough, sputum production and shortness of  breath.   Cardiovascular:  Negative for chest pain, palpitations and leg swelling.  Gastrointestinal:  Negative for abdominal pain, constipation, diarrhea and heartburn.  Genitourinary:  Negative for dysuria, frequency and urgency.  Musculoskeletal:  Negative for back pain, falls, joint pain and myalgias.  Skin: Negative.   Neurological:  Negative for dizziness and headaches.       Pain going down right leg, unable to reproduce   Psychiatric/Behavioral:  Negative for depression and memory loss. The patient does not have insomnia.     Past Medical History:  Diagnosis Date   Aphasia    Arthritis    OA- fingers, toes, back pain, hip pain    Breast cancer of upper-outer quadrant of left female breast (HCC)    left breast cancer   Complication of anesthesia    slow to wake up   Disorder of bone and cartilage, unspecified    Diverticulitis    Hypothyroid    Lumbago    Malignant neoplasm of breast (female), unspecified site    Normal nuclear stress test    [facility], 07/18/2011   Osteoarthritis (arthritis due to wear and tear of joints)    Pain in joint, pelvic region and thigh    Reflux esophagitis    pt. knowledgeable about dietary intake relative to reflux   S/P bilateral breast biopsy 2013   Shortness of breath    /w housework  Symptomatic menopausal or female climacteric states    Past Surgical History:  Procedure Laterality Date   APPENDECTOMY     BREAST BIOPSY  07/26/2011   Procedure: BREAST BIOPSY WITH NEEDLE LOCALIZATION;  Surgeon: Rolm Bookbinder, MD;  Location: [place];  Service: General;  Laterality: Right;  Right breast wire guided biospy Needle localization @ [facility]  7:30   BREAST SURGERY     Procedure: BREAST BIOPSY WITH NEEDLE LOCALIZATION;  [place]   CATARACT EXTRACTION  2012   DILATION AND CURETTAGE OF UTERUS  1978   ECTOPIC PREGNANCY SURGERY     1980   EYE SURGERY     R cat. extracted /w IOL   LAPAROSCOPIC APPENDECTOMY N/A 03/03/2019   Procedure: APPENDECTOMY  LAPAROSCOPIC;  Surgeon: Armandina Gemma, MD;  Location: [facility];  Service: General;  Laterality: N/A;   OVARIAN CYST SURGERY     TONSILLECTOMY     as a child   Social History:   reports that she quit smoking about 41 years ago. Her smoking use included cigarettes. She smoked an average of .25 packs per day. She has never used smokeless tobacco. She reports that she does not drink alcohol and does not use drugs.  Family History  Problem Relation Age of Onset   Cancer Sister        colon   Cancer Brother        renal   Heart disease Brother    Cancer Mother        leukemia   Peripheral vascular disease Mother    Cancer Father        lung   Heart attack Brother    Cancer Maternal Aunt        breast   Cancer Cousin    Cancer Cousin    Anesthesia problems Neg Hx     Medications: Patient's Medications  New Prescriptions   No medications on file  Previous Medications   ACETAMINOPHEN (TYLENOL) 500 MG TABLET    Take 2 tablets (1,000 mg total) by mouth every 8 (eight) hours as needed for mild pain.   CHOLECALCIFEROL (D3 5000) 125 MCG (5000 UT) CAPSULE    Take 5,000 Units by mouth daily.   CYANOCOBALAMIN (VITAMIN B-12) 1000 MCG SUBL    Place 1 tablet under the tongue daily. At breakfast   EZETIMIBE (ZETIA) 10 MG TABLET    TAKE 1 TABLET BY MOUTH EVERY DAY   LEVOTHYROXINE (SYNTHROID) 75 MCG TABLET    TAKE 1 TABLET BY MOUTH EVERY DAY BEFORE BREAKFAST   MAGNESIUM 250 MG TABS    Take by mouth daily at 6 (six) AM.   OVER THE COUNTER MEDICATION    Comfort Guard ( [place] powder, Boswellia poweder, Ginger)   VITAMIN E 400 UNIT CAPSULE    Take 800 Units by mouth daily.   Modified Medications   No medications on file  Discontinued Medications   BOSWELLIA-GLUCOSAMINE-VIT D (OSTEO BI-FLEX ONE PER DAY PO)    Take 1 tablet by mouth daily.   TRAZODONE (DESYREL) 50 MG TABLET    Take 1.5 tablets (75 mg total) by mouth at bedtime.    Physical Exam:  Vitals:   03/25/22 1037  BP: 134/72  Pulse: 70   Temp: (!) 97.1 F (36.2 C)  TempSrc: Temporal  SpO2: 97%  Weight: 150 lb (68 kg)  Height: '5\' 2"'$  (1.575 m)   Body mass index is 27.44 kg/m. Wt Readings from Last 3 Encounters:  03/25/22 150 lb (68 kg)  01/24/22 150 lb (68 kg)  11/23/21 151 lb (68.5 kg)    Physical Exam Constitutional:      General: She is not in acute distress.    Appearance: She is well-developed. She is not diaphoretic.  HENT:     Head: Normocephalic and atraumatic.     Mouth/Throat:     Pharynx: No oropharyngeal exudate.  Eyes:     Conjunctiva/sclera: Conjunctivae normal.     Pupils: Pupils are equal, round, and reactive to light.  Cardiovascular:     Rate and Rhythm: Normal rate and regular rhythm.     Heart sounds: Normal heart sounds.  Pulmonary:     Effort: Pulmonary effort is normal.     Breath sounds: Normal breath sounds.  Abdominal:     General: Bowel sounds are normal.     Palpations: Abdomen is soft.  Musculoskeletal:     Cervical back: Normal range of motion and neck supple.     Right lower leg: No edema.     Left lower leg: No edema.  Skin:    General: Skin is warm and dry.  Neurological:     Mental Status: She is alert.  Psychiatric:        Mood and Affect: Mood normal.     Labs reviewed: Basic Metabolic Panel: Recent Labs    06/04/21 1007 03/20/22 0845  NA 140 143  K 4.2 5.0  CL 107 108  CO2 28 28  GLUCOSE 82 88  BUN 13 14  CREATININE 0.80 0.81  CALCIUM 8.8 9.2  TSH 1.10 5.78*   Liver Function Tests: Recent Labs    06/04/21 1007 03/20/22 0845  AST 15 14  ALT 10 9  BILITOT 0.6 0.7  PROT 6.8 7.1   No results for input(s): "LIPASE", "AMYLASE" in the last 8760 hours. No results for input(s): "AMMONIA" in the last 8760 hours. CBC: Recent Labs    06/04/21 1007 03/20/22 0845  WBC 3.6* 3.5*  NEUTROABS 1,832 1,659  HGB 14.0 14.3  HCT 41.8 41.9  MCV 93.7 92.9  PLT 212 236   Lipid Panel: Recent Labs    06/04/21 1007 03/20/22 0845  CHOL 198 208*  HDL  60 71  LDLCALC 114* 115*  TRIG 129 111  CHOLHDL 3.3 2.9   TSH: Recent Labs    06/04/21 1007 03/20/22 0845  TSH 1.10 5.78*   A1C: No results found for: "HGBA1C"   Assessment/Plan 1. Sciatica of right side -worse, no injury - DG Lumbar Spine Complete; Future for further evaluation- consider PT referral vs neurosurgery and spine  -to use aleve PO BID for 5  days to take with food  2. Grief -ongoing, working with a therapist again, continue lifestyle modifications, consider low dose SSRI if she is interested in the further- wants to hold off for now.  3. Acquired hypothyroidism Continues on synthroid 75 mcg, will monitor TSH  4. Insomnia, unspecified type -doing well, off trazodone  5. Pure hypercholesterolemia -stable, continues on zetia with dietary modifications    Return in about 6 months (around 09/23/2022) for routine follow up. Carlos American. [place], [facility] [phone]

## 2022-03-28 ENCOUNTER — Ambulatory Visit
Admission: RE | Admit: 2022-03-28 | Discharge: 2022-03-28 | Disposition: A | Discharge status: Home / Self Care | Payer: Medicare Other | Source: Ambulatory Visit | Attending: Nurse Practitioner | Admitting: Nurse Practitioner

## 2022-03-28 DIAGNOSIS — M5431 Sciatica, right side: Secondary | ICD-10-CM

## 2022-03-28 DIAGNOSIS — M5136 Other intervertebral disc degeneration, lumbar region: Secondary | ICD-10-CM | POA: Diagnosis not present

## 2022-03-28 DIAGNOSIS — M545 Low back pain, unspecified: Secondary | ICD-10-CM | POA: Diagnosis not present

## 2022-03-29 ENCOUNTER — Other Ambulatory Visit: Payer: Self-pay | Admitting: Nurse Practitioner

## 2022-03-29 DIAGNOSIS — M5431 Sciatica, right side: Secondary | ICD-10-CM

## 2022-04-02 ENCOUNTER — Telehealth: Payer: Self-pay | Admitting: *Deleted

## 2022-04-02 NOTE — Telephone Encounter (Signed)
Message sent to [facility]

## 2022-04-02 NOTE — Telephone Encounter (Signed)
Patient called and wanted to know if you could add a note to your Referral for PT to send her to [facility]. Stated that it is closer to her home.   Please Advise.

## 2022-04-02 NOTE — Telephone Encounter (Signed)
Please let the referral person know this information. Thank you

## 2022-04-15 DIAGNOSIS — Z1231 Encounter for screening mammogram for malignant neoplasm of breast: Secondary | ICD-10-CM | POA: Diagnosis not present

## 2022-04-15 LAB — HM MAMMOGRAPHY

## 2022-04-23 ENCOUNTER — Encounter: Payer: Self-pay | Admitting: Nurse Practitioner

## 2022-04-26 DIAGNOSIS — M5431 Sciatica, right side: Secondary | ICD-10-CM | POA: Diagnosis not present

## 2022-05-02 ENCOUNTER — Encounter: Payer: Self-pay | Admitting: Nurse Practitioner

## 2022-05-03 ENCOUNTER — Encounter: Payer: Self-pay | Admitting: Orthopedic Surgery

## 2022-05-03 ENCOUNTER — Telehealth (INDEPENDENT_AMBULATORY_CARE_PROVIDER_SITE_OTHER): Payer: Medicare Other | Admitting: Orthopedic Surgery

## 2022-05-03 ENCOUNTER — Telehealth: Payer: Self-pay

## 2022-05-03 DIAGNOSIS — R52 Pain, unspecified: Secondary | ICD-10-CM

## 2022-05-03 DIAGNOSIS — J029 Acute pharyngitis, unspecified: Secondary | ICD-10-CM | POA: Diagnosis not present

## 2022-05-03 DIAGNOSIS — J019 Acute sinusitis, unspecified: Secondary | ICD-10-CM

## 2022-05-03 MED ORDER — PREDNISONE 10 MG PO TABS: ORAL_TABLET | ORAL | 0 refills | Status: AC

## 2022-05-03 NOTE — Progress Notes (Signed)
This service is provided via telemedicine  No vital signs collected/recorded due to the encounter was a telemedicine visit.   Location of patient (ex: home, work):  Home   Patient consents to a telephone visit:  Yes, see telephone encounter dated   Location of the provider (ex: office, home):  [facility]  Name of any referring provider:  Windell Moulding, NP   Names of all persons participating in the telemedicine service and their role in the encounter: Andreya Lacks B/CMA, Windell Moulding, NP, and patient  Time spent on call:  11 minutes

## 2022-05-03 NOTE — Telephone Encounter (Signed)
Patient is ready to seen for a MyChart video for appointment this morning.

## 2022-05-03 NOTE — Patient Instructions (Signed)
Prescription for prednisone sent to pharmacy- take in the morning- will help with sore throat  Warm fluids and salt water gargles for sore throat will also help  Purchase Zyrtec or Claritin at pharmacy- will help reduce nasal congestion  May consider Flonase (steroid nasal spray) as well  If symptoms do not improve or worsen please contact office

## 2022-05-03 NOTE — Progress Notes (Signed)
I connected with  Sylvia [name] on 05/03/22 by a video enabled telemedicine application and verified that I am speaking with the correct person using two identifiers.   I discussed the limitations of evaluation and management by telemedicine. The patient expressed understanding and agreed to proceed.

## 2022-05-03 NOTE — Progress Notes (Signed)
Careteam: Patient Care Team: Sylvia Chandler, NP as PCP - General (Geriatric Medicine) Sylvia Danes, PA-C as Physician Assistant (Dermatology) Magrinat, Virgie Dad, MD (Inactive) as Consulting Physician (Oncology) Sylvia Corner, MD as Consulting Physician (Gastroenterology)  Seen by: Sylvia [name], AGNP-C  PLACE OF SERVICE:  [place]  Advanced Directive information    Allergies  Allergen Reactions   Benadryl [Diphenhydramine Hcl] Other (See Comments)    "Super hyper"   Tape     Skin breakdown/redness    Boniva [Ibandronate Sodium] Other (See Comments)    Neck, shoulder limited mobility    Peridin-C [Ascorbic Acid] Diarrhea   Venlafaxine Nausea Only   Chlorhexidine Gluconate Itching   Codeine Other (See Comments)    "can't wake up from it."   Sulfa Antibiotics Rash    Patient unsure of reaction    Chief Complaint  Patient presents with   Acute Visit    Patient with sore and swollen throat along with sinus symptoms.      HPI: Patient is a 77 y.o. female seen today for acute visit via telephone due to sore throat and nasal congestion.   01/02 she woke up with symptoms of sore throat and nasal congestion. 01/03 she had a fever of 99.9 and body aches. Denies exposure to sick persons. She has not tested herself for covid. Today, she denies fever and body aches. Sore throat and nasal congestion continue. She is able to swallow fluids and foods without difficulty. Unable to tell color of nasal congestion. She has had intermittent headaches. Using saline nasal spray without success. Up to date on flu vaccine. She has not had recent covid vaccine.   Discussed exam limitations due to telephone encounter. If symptoms worsen or do not improve she understands she will need in person visit.    Review of Systems:  Review of Systems  Constitutional:  Positive for fever and malaise/fatigue. Negative for chills and weight loss.  HENT:  Positive for congestion and sore  throat. Negative for ear pain and sinus pain.   Eyes:  Negative for blurred vision and double vision.  Respiratory:  Negative for cough, shortness of breath and wheezing.   Cardiovascular:  Negative for chest pain and leg swelling.  Gastrointestinal:  Negative for diarrhea, nausea and vomiting.  Musculoskeletal:  Positive for myalgias.  Neurological:  Negative for dizziness and headaches.  Psychiatric/Behavioral:  Negative for depression. The patient is not nervous/anxious.     Past Medical History:  Diagnosis Date   Aphasia    Arthritis    OA- fingers, toes, back pain, hip pain    Breast cancer of upper-outer quadrant of left female breast (HCC)    left breast cancer   Complication of anesthesia    slow to wake up   Disorder of bone and cartilage, unspecified    Diverticulitis    Hypothyroid    Lumbago    Malignant neoplasm of breast (female), unspecified site    Normal nuclear stress test    [facility], 07/18/2011   Osteoarthritis (arthritis due to wear and tear of joints)    Pain in joint, pelvic region and thigh    Reflux esophagitis    pt. knowledgeable about dietary intake relative to reflux   S/P bilateral breast biopsy 2013   Shortness of breath    /w housework   Symptomatic menopausal or female climacteric states    Past Surgical History:  Procedure Laterality Date   APPENDECTOMY     BREAST  BIOPSY  07/26/2011   Procedure: BREAST BIOPSY WITH NEEDLE LOCALIZATION;  Surgeon: Rolm Bookbinder, MD;  Location: [facility];  Service: General;  Laterality: Right;  Right breast wire guided biospy Needle localization @ [facility]  7:30   BREAST SURGERY     Procedure: BREAST BIOPSY WITH NEEDLE LOCALIZATION;  [place]   CATARACT EXTRACTION  2012   DILATION AND CURETTAGE OF UTERUS  1978   ECTOPIC PREGNANCY SURGERY     1980   EYE SURGERY     R cat. extracted /w IOL   LAPAROSCOPIC APPENDECTOMY N/A 03/03/2019   Procedure: APPENDECTOMY LAPAROSCOPIC;  Surgeon: Armandina Gemma, MD;  Location: [facility];  Service: General;  Laterality: N/A;   OVARIAN CYST SURGERY     TONSILLECTOMY     as a child   Social History:   reports that she quit smoking about 41 years ago. Her smoking use included cigarettes. She smoked an average of .25 packs per day. She has never used smokeless tobacco. She reports that she does not drink alcohol and does not use drugs.  Family History  Problem Relation Age of Onset   Cancer Sister        colon   Cancer Brother        renal   Heart disease Brother    Cancer Mother        leukemia   Peripheral vascular disease Mother    Cancer Father        lung   Heart attack Brother    Cancer Maternal Aunt        breast   Cancer Cousin    Cancer Cousin    Anesthesia problems Neg Hx     Medications: Patient's Medications  New Prescriptions   No medications on file  Previous Medications   ACETAMINOPHEN (TYLENOL) 500 MG TABLET    Take 2 tablets (1,000 mg total) by mouth every 8 (eight) hours as needed for mild pain.   CHOLECALCIFEROL (D3 5000) 125 MCG (5000 UT) CAPSULE    Take 5,000 Units by mouth daily.   CYANOCOBALAMIN (VITAMIN B-12) 1000 MCG SUBL    Place 1 tablet under the tongue daily. At breakfast   EZETIMIBE (ZETIA) 10 MG TABLET    TAKE 1 TABLET BY MOUTH EVERY DAY   LEVOTHYROXINE (SYNTHROID) 75 MCG TABLET    TAKE 1 TABLET BY MOUTH EVERY DAY BEFORE BREAKFAST   MAGNESIUM 250 MG TABS    Take by mouth daily at 6 (six) AM.   OVER THE COUNTER MEDICATION    Comfort Guard ( [place] powder, Boswellia poweder, Ginger)   VITAMIN E 400 UNIT CAPSULE    Take 800 Units by mouth daily.   Modified Medications   No medications on file  Discontinued Medications   No medications on file    Physical Exam:  There were no vitals filed for this visit. There is no height or weight on file to calculate BMI. Wt Readings from Last 3 Encounters:  03/25/22 150 lb (68 kg)  01/24/22 150 lb (68 kg)  11/23/21 151 lb (68.5 kg)    Physical Examunable to perform due to  telephone encounter  Labs reviewed: Basic Metabolic Panel: Recent Labs    06/04/21 1007 03/20/22 0845  NA 140 143  K 4.2 5.0  CL 107 108  CO2 28 28  GLUCOSE 82 88  BUN 13 14  CREATININE 0.80 0.81  CALCIUM 8.8 9.2  TSH 1.10 5.78*   Liver Function Tests: Recent Labs  06/04/21 1007 03/20/22 0845  AST 15 14  ALT 10 9  BILITOT 0.6 0.7  PROT 6.8 7.1   No results for input(s): "LIPASE", "AMYLASE" in the last 8760 hours. No results for input(s): "AMMONIA" in the last 8760 hours. CBC: Recent Labs    06/04/21 1007 03/20/22 0845  WBC 3.6* 3.5*  NEUTROABS 1,832 1,659  HGB 14.0 14.3  HCT 41.8 41.9  MCV 93.7 92.9  PLT 212 236   Lipid Panel: Recent Labs    06/04/21 1007 03/20/22 0845  CHOL 198 208*  HDL 60 71  LDLCALC 114* 115*  TRIG 129 111  CHOLHDL 3.3 2.9   TSH: Recent Labs    06/04/21 1007 03/20/22 0845  TSH 1.10 5.78*   A1C: No results found for: "HGBA1C"   Assessment/Plan 1. Sore throat - began 01/02 - able to swallow fluids and foods - start prednisone taper to help with symptoms - cont warm fluids - may try throat lozenges OTC - contact PCP if symptoms worsen or do not improve - predniSONE (DELTASONE) 10 MG tablet; Take 4 tablets (40 mg total) by mouth daily with breakfast for 1 day, THEN 3 tablets (30 mg total) daily with breakfast for 1 day, THEN 2 tablets (20 mg total) daily with breakfast for 1 day, THEN 1 tablet (10 mg total) daily with breakfast for 1 day, THEN 0.5 tablets (5 mg total) daily with breakfast for 1 day.  Dispense: 10.5 tablet; Refill: 0  2. Generalized body aches - reported 01/03 with fever 99.9 - subsided at this time - ? Flu/covid - unable to test due to telephone encounter  3. Acute non-recurrent sinusitis, unspecified location - began 01/02 - exam limited due to telephone encounter - cont saline nasal spray - recommend Claritin or Zyrtec  - may also try Flonase - contact PCP if symptoms worsen or do not  resolve  Telephone Note  I connected with Sylvia [name] by telephone and verified that I am speaking with the correct person using two identifiers.  [place] Patient: Sylvia [name] Provider: Niel Hummer   I discussed the limitations, risks, security and privacy concerns of performing an evaluation and management service by telephone and the availability of in person appointments. I also discussed with the patient that there may be a patient responsible charge related to this service. The patient expressed understanding and agreed to proceed.   I discussed the assessment and treatment plan with the patient. The patient was provided an opportunity to ask questions and all were answered. The patient agreed with the plan and demonstrated an understanding of the instructions.   The patient was advised to call back or seek an in-person evaluation if the symptoms worsen or if the condition fails to improve as anticipated.  I provided 11 minutes of non-face-to-face time during this encounter.  Sylvia [name] Cleophas Dunker, AGNP Avs printed and mailed   Total time: 11 minutes. Greater than 50% of total time spent doing patient education regarding sore throat, nasal congestion, myalgias including symptom/medication management.     Next appt: Visit date not found  [facility] [phone]

## 2022-05-06 ENCOUNTER — Encounter: Payer: Self-pay | Admitting: Nurse Practitioner

## 2022-05-06 ENCOUNTER — Ambulatory Visit (INDEPENDENT_AMBULATORY_CARE_PROVIDER_SITE_OTHER): Payer: Medicare Other | Admitting: Nurse Practitioner

## 2022-05-06 VITALS — BP 132/78 | HR 67 | Temp 97.3°F | Ht 62.0 in | Wt 150.0 lb

## 2022-05-06 DIAGNOSIS — J029 Acute pharyngitis, unspecified: Secondary | ICD-10-CM | POA: Diagnosis not present

## 2022-05-06 DIAGNOSIS — U071 COVID-19: Secondary | ICD-10-CM | POA: Diagnosis not present

## 2022-05-06 DIAGNOSIS — R52 Pain, unspecified: Secondary | ICD-10-CM

## 2022-05-06 LAB — POCT INFLUENZA A/B
Influenza A, POC: NEGATIVE
Influenza B, POC: NEGATIVE

## 2022-05-06 LAB — POC COVID19 BINAXNOW: SARS Coronavirus 2 Ag: POSITIVE — AB

## 2022-05-06 LAB — POCT RAPID STREP A (OFFICE): Rapid Strep A Screen: NEGATIVE

## 2022-05-06 NOTE — Patient Instructions (Addendum)
Netipot or saline wash daily Plain nasal saline spray throughout the day as needed May use tylenol 325 mg 2 tablets every 6-8 hours as needed aches and pains or sore throat humidifier in the home to help with the dry air Mucinex DM by mouth twice daily as needed for cough and chest congestion with full glass of water  Keep well hydrated Proper nutrition  Avoid forcefully blowing nose Vit c 1000 mg daily Vit d 2000 units daily  -continue antihistamine daily    Your test for COVID-19 was positive, meaning that you were infected with the coronavirus and could give the germ to others.  Please continue isolation at home for at least 5 days since the start of your symptoms. On day 5, as long as your symptoms are improving and you are not having a fever, you can return to normal activities, ensuring you are wearing a mask at all times when not at home.     Once you complete your 10 days you are good to resume normal acitivities.

## 2022-05-06 NOTE — Progress Notes (Unsigned)
Careteam: Patient Care Team: Lauree Chandler, NP as PCP - General (Geriatric Medicine) Warren Danes, PA-C as Physician Assistant (Dermatology) Magrinat, Virgie Dad, MD (Inactive) as Consulting Physician (Oncology) Wilford Corner, MD as Consulting Physician (Gastroenterology)  PLACE OF SERVICE:  [place]  Advanced Directive information    Allergies  Allergen Reactions   Benadryl [Diphenhydramine Hcl] Other (See Comments)    "Super hyper"   Tape     Skin breakdown/redness    Boniva [Ibandronate Sodium] Other (See Comments)    Neck, shoulder limited mobility    Peridin-C [Ascorbic Acid] Diarrhea   Venlafaxine Nausea Only   Chlorhexidine Gluconate Itching   Codeine Other (See Comments)    "can't wake up from it."   Sulfa Antibiotics Rash    Patient unsure of reaction    Chief Complaint  Patient presents with   Acute Visit    Patient c/o head congestion, dry cough and "Throat feels thick"     HPI: Patient is a 77 y.o. female due to not feeling well. Reports she has had sore throat, congestion.  Has cough- clear yellow sputum- small amount.  Worse at night  more head congestion but chest is tight.  Feels like she may have strept throat.  Symptoms have been ongoing since Tuesday the 2nd of January.  Has not had a fever 99.9.  She had an "antihistamine pill" which has helped.  Still on prednisone- tomorrow is the last tablet.   Review of Systems:  Review of Systems  Constitutional:  Positive for malaise/fatigue. Negative for chills, fever and weight loss.  HENT:  Positive for congestion and sore throat. Negative for sinus pain and tinnitus.   Respiratory:  Positive for cough. Negative for sputum production, shortness of breath and stridor.   Cardiovascular:  Negative for chest pain, palpitations and leg swelling.  Gastrointestinal:  Negative for abdominal pain, constipation, diarrhea and heartburn.  Skin: Negative.   Neurological:  Negative for  dizziness and headaches.  Psychiatric/Behavioral:  Negative for depression and memory loss. The patient does not have insomnia.       Past Medical History:  Diagnosis Date   Aphasia    Arthritis    OA- fingers, toes, back pain, hip pain    Breast cancer of upper-outer quadrant of left female breast (HCC)    left breast cancer   Complication of anesthesia    slow to wake up   Disorder of bone and cartilage, unspecified    Diverticulitis    Hypothyroid    Lumbago    Malignant neoplasm of breast (female), unspecified site    Normal nuclear stress test    [facility], 07/18/2011   Osteoarthritis (arthritis due to wear and tear of joints)    Pain in joint, pelvic region and thigh    Reflux esophagitis    pt. knowledgeable about dietary intake relative to reflux   S/P bilateral breast biopsy 2013   Shortness of breath    /w housework   Symptomatic menopausal or female climacteric states    Past Surgical History:  Procedure Laterality Date   APPENDECTOMY     BREAST BIOPSY  07/26/2011   Procedure: BREAST BIOPSY WITH NEEDLE LOCALIZATION;  Surgeon: Rolm Bookbinder, MD;  Location: [facility];  Service: General;  Laterality: Right;  Right breast wire guided biospy Needle localization @ [facility]  7:30   BREAST SURGERY     Procedure: BREAST BIOPSY WITH NEEDLE LOCALIZATION;  [place]   CATARACT EXTRACTION  2012  DILATION AND CURETTAGE OF UTERUS  1978   ECTOPIC PREGNANCY SURGERY     1980   EYE SURGERY     R cat. extracted /w IOL   LAPAROSCOPIC APPENDECTOMY N/A 03/03/2019   Procedure: APPENDECTOMY LAPAROSCOPIC;  Surgeon: Armandina Gemma, MD;  Location: [facility];  Service: General;  Laterality: N/A;   OVARIAN CYST SURGERY     TONSILLECTOMY     as a child   Social History:   reports that she quit smoking about 41 years ago. Her smoking use included cigarettes. She smoked an average of .25 packs per day. She has never used smokeless tobacco. She reports that she does not drink alcohol and does not use  drugs.  Family History  Problem Relation Age of Onset   Cancer Sister        colon   Cancer Brother        renal   Heart disease Brother    Cancer Mother        leukemia   Peripheral vascular disease Mother    Cancer Father        lung   Heart attack Brother    Cancer Maternal Aunt        breast   Cancer Cousin    Cancer Cousin    Anesthesia problems Neg Hx     Medications: Patient's Medications  New Prescriptions   No medications on file  Previous Medications   ACETAMINOPHEN (TYLENOL) 500 MG TABLET    Take 2 tablets (1,000 mg total) by mouth every 8 (eight) hours as needed for mild pain.   CHOLECALCIFEROL (D3 5000) 125 MCG (5000 UT) CAPSULE    Take 5,000 Units by mouth daily.   CYANOCOBALAMIN (VITAMIN B-12) 1000 MCG SUBL    Place 1 tablet under the tongue daily. At breakfast   EZETIMIBE (ZETIA) 10 MG TABLET    TAKE 1 TABLET BY MOUTH EVERY DAY   LEVOTHYROXINE (SYNTHROID) 75 MCG TABLET    TAKE 1 TABLET BY MOUTH EVERY DAY BEFORE BREAKFAST   MAGNESIUM 250 MG TABS    Take by mouth daily at 6 (six) AM.   OVER THE COUNTER MEDICATION    Comfort Guard ( [place] powder, Boswellia poweder, Ginger)   OVER THE COUNTER MEDICATION    Take 2 capsules by mouth daily. Serotonin Mood Support   PREDNISONE (DELTASONE) 10 MG TABLET    Take 4 tablets (40 mg total) by mouth daily with breakfast for 1 day, THEN 3 tablets (30 mg total) daily with breakfast for 1 day, THEN 2 tablets (20 mg total) daily with breakfast for 1 day, THEN 1 tablet (10 mg total) daily with breakfast for 1 day, THEN 0.5 tablets (5 mg total) daily with breakfast for 1 day.   VITAMIN E 400 UNIT CAPSULE    Take 800 Units by mouth daily.   Modified Medications   No medications on file  Discontinued Medications   No medications on file    Physical Exam:  Vitals:   05/06/22 1507  BP: 132/78  Pulse: 67  Temp: (!) 97.3 F (36.3 C)  TempSrc: Temporal  SpO2: 97%  Weight: 150 lb (68 kg)  Height: '5\' 2"'$  (1.575 m)   Body  mass index is 27.44 kg/m. Wt Readings from Last 3 Encounters:  05/06/22 150 lb (68 kg)  03/25/22 150 lb (68 kg)  01/24/22 150 lb (68 kg)    Physical Exam***  Labs reviewed: Basic Metabolic Panel: Recent Labs    06/04/21 1007  03/20/22 0845  NA 140 143  K 4.2 5.0  CL 107 108  CO2 28 28  GLUCOSE 82 88  BUN 13 14  CREATININE 0.80 0.81  CALCIUM 8.8 9.2  TSH 1.10 5.78*   Liver Function Tests: Recent Labs    06/04/21 1007 03/20/22 0845  AST 15 14  ALT 10 9  BILITOT 0.6 0.7  PROT 6.8 7.1   No results for input(s): "LIPASE", "AMYLASE" in the last 8760 hours. No results for input(s): "AMMONIA" in the last 8760 hours. CBC: Recent Labs    06/04/21 1007 03/20/22 0845  WBC 3.6* 3.5*  NEUTROABS 1,832 1,659  HGB 14.0 14.3  HCT 41.8 41.9  MCV 93.7 92.9  PLT 212 236   Lipid Panel: Recent Labs    06/04/21 1007 03/20/22 0845  CHOL 198 208*  HDL 60 71  LDLCALC 114* 115*  TRIG 129 111  CHOLHDL 3.3 2.9   TSH: Recent Labs    06/04/21 1007 03/20/22 0845  TSH 1.10 5.78*   A1C: No results found for: "HGBA1C"   Assessment/Plan There are no diagnoses linked to this encounter.  No follow-ups on file.: ***  Sharmon Cheramie K. [place], [facility] [phone]

## 2022-05-14 DIAGNOSIS — M5431 Sciatica, right side: Secondary | ICD-10-CM | POA: Diagnosis not present

## 2022-05-21 DIAGNOSIS — M5431 Sciatica, right side: Secondary | ICD-10-CM | POA: Diagnosis not present

## 2022-05-24 ENCOUNTER — Encounter: Payer: Medicare Other | Admitting: Nurse Practitioner

## 2022-05-27 ENCOUNTER — Encounter: Payer: Self-pay | Admitting: Nurse Practitioner

## 2022-05-27 ENCOUNTER — Ambulatory Visit (INDEPENDENT_AMBULATORY_CARE_PROVIDER_SITE_OTHER): Payer: Medicare Other | Admitting: Nurse Practitioner

## 2022-05-27 VITALS — BP 110/70 | HR 70 | Temp 97.9°F | Ht 63.0 in | Wt 152.0 lb

## 2022-05-27 DIAGNOSIS — E2839 Other primary ovarian failure: Secondary | ICD-10-CM

## 2022-05-27 DIAGNOSIS — Z0000 Encounter for general adult medical examination without abnormal findings: Secondary | ICD-10-CM

## 2022-05-27 NOTE — Patient Instructions (Signed)
Sylvia [name] , Thank you for taking time to come for your Medicare Wellness Visit. I appreciate your ongoing commitment to your health goals. Please review the following plan we discussed and let me know if I can assist you in the future.   Screening recommendations/referrals: Colonoscopy -to call and schedule Mammogram up to date Bone Density -order placed- due after July 12, 2022 Recommended yearly ophthalmology/optometry visit for glaucoma screening and checkup Recommended yearly dental visit for hygiene and checkup  Vaccinations: Influenza vaccine- due annually in September/October Pneumococcal vaccine up to date Tdap vaccine up to date Shingles vaccine up to date    Advanced directives: on file.   Conditions/risks identified: advanced age, pain in right leg/knee  Next appointment: yearly for awv   Preventive Care 52 Years and Older, Female Preventive care refers to lifestyle choices and visits with your health care provider that can promote health and wellness. What does preventive care include? A yearly physical exam. This is also called an annual well check. Dental exams once or twice a year. Routine eye exams. Ask your health care provider how often you should have your eyes checked. Personal lifestyle choices, including: Daily care of your teeth and gums. Regular physical activity. Eating a healthy diet. Avoiding tobacco and drug use. Limiting alcohol use. Practicing safe sex. Taking low-dose aspirin every day. Taking vitamin and mineral supplements as recommended by your health care provider. What happens during an annual well check? The services and screenings done by your health care provider during your annual well check will depend on your age, overall health, lifestyle risk factors, and family history of disease. Counseling  Your health care provider may ask you questions about your: Alcohol use. Tobacco use. Drug use. Emotional well-being. Home and  relationship well-being. Sexual activity. Eating habits. History of falls. Memory and ability to understand (cognition). Work and work Statistician. Reproductive health. Screening  You may have the following tests or measurements: Height, weight, and BMI. Blood pressure. Lipid and cholesterol levels. These may be checked every 5 years, or more frequently if you are over 43 years old. Skin check. Lung cancer screening. You may have this screening every year starting at age 93 if you have a 30-pack-year history of smoking and currently smoke or have quit within the past 15 years. Fecal occult blood test (FOBT) of the stool. You may have this test every year starting at age 85. Flexible sigmoidoscopy or colonoscopy. You may have a sigmoidoscopy every 5 years or a colonoscopy every 10 years starting at age 78. Hepatitis C blood test. Hepatitis B blood test. Sexually transmitted disease (STD) testing. Diabetes screening. This is done by checking your blood sugar (glucose) after you have not eaten for a while (fasting). You may have this done every 1-3 years. Bone density scan. This is done to screen for osteoporosis. You may have this done starting at age 106. Mammogram. This may be done every 1-2 years. Talk to your health care provider about how often you should have regular mammograms. Talk with your health care provider about your test results, treatment options, and if necessary, the need for more tests. Vaccines  Your health care provider may recommend certain vaccines, such as: Influenza vaccine. This is recommended every year. Tetanus, diphtheria, and acellular pertussis (Tdap, Td) vaccine. You may need a Td booster every 10 years. Zoster vaccine. You may need this after age 63. Pneumococcal 13-valent conjugate (PCV13) vaccine. One dose is recommended after age 75. Pneumococcal polysaccharide (PPSV23) vaccine. One  dose is recommended after age 61. Talk to your health care provider  about which screenings and vaccines you need and how often you need them. This information is not intended to replace advice given to you by your health care provider. Make sure you discuss any questions you have with your health care provider. Document Released: 05/12/2015 Document Revised: 01/03/2016 Document Reviewed: 02/14/2015 Elsevier Interactive Patient Education  2017 [employer] Prevention in the Home Falls can cause injuries. They can happen to people of all ages. There are many things you can do to make your home safe and to help prevent falls. What can I do on the outside of my home? Regularly fix the edges of walkways and driveways and fix any cracks. Remove anything that might make you trip as you walk through a door, such as a raised step or threshold. Trim any bushes or trees on the path to your home. Use bright outdoor lighting. Clear any walking paths of anything that might make someone trip, such as rocks or tools. Regularly check to see if handrails are loose or broken. Make sure that both sides of any steps have handrails. Any raised decks and porches should have guardrails on the edges. Have any leaves, snow, or ice cleared regularly. Use sand or salt on walking paths during winter. Clean up any spills in your garage right away. This includes oil or grease spills. What can I do in the bathroom? Use night lights. Install grab bars by the toilet and in the tub and shower. Do not use towel bars as grab bars. Use non-skid mats or decals in the tub or shower. If you need to sit down in the shower, use a plastic, non-slip stool. Keep the floor dry. Clean up any water that spills on the floor as soon as it happens. Remove soap buildup in the tub or shower regularly. Attach bath mats securely with double-sided non-slip rug tape. Do not have throw rugs and other things on the floor that can make you trip. What can I do in the bedroom? Use night lights. Make sure  that you have a light by your bed that is easy to reach. Do not use any sheets or blankets that are too big for your bed. They should not hang down onto the floor. Have a firm chair that has side arms. You can use this for support while you get dressed. Do not have throw rugs and other things on the floor that can make you trip. What can I do in the kitchen? Clean up any spills right away. Avoid walking on wet floors. Keep items that you use a lot in easy-to-reach places. If you need to reach something above you, use a strong step stool that has a grab bar. Keep electrical cords out of the way. Do not use floor polish or wax that makes floors slippery. If you must use wax, use non-skid floor wax. Do not have throw rugs and other things on the floor that can make you trip. What can I do with my stairs? Do not leave any items on the stairs. Make sure that there are handrails on both sides of the stairs and use them. Fix handrails that are broken or loose. Make sure that handrails are as long as the stairways. Check any carpeting to make sure that it is firmly attached to the stairs. Fix any carpet that is loose or worn. Avoid having throw rugs at the top or bottom of the  stairs. If you do have throw rugs, attach them to the floor with carpet tape. Make sure that you have a light switch at the top of the stairs and the bottom of the stairs. If you do not have them, ask someone to add them for you. What else can I do to help prevent falls? Wear shoes that: Do not have high heels. Have rubber bottoms. Are comfortable and fit you well. Are closed at the toe. Do not wear sandals. If you use a stepladder: Make sure that it is fully opened. Do not climb a closed stepladder. Make sure that both sides of the stepladder are locked into place. Ask someone to hold it for you, if possible. Clearly mark and make sure that you can see: Any grab bars or handrails. First and last steps. Where the edge of  each step is. Use tools that help you move around (mobility aids) if they are needed. These include: Canes. Walkers. Scooters. Crutches. Turn on the lights when you go into a dark area. Replace any light bulbs as soon as they burn out. Set up your furniture so you have a clear path. Avoid moving your furniture around. If any of your floors are uneven, fix them. If there are any pets around you, be aware of where they are. Review your medicines with your doctor. Some medicines can make you feel dizzy. This can increase your chance of falling. Ask your doctor what other things that you can do to help prevent falls. This information is not intended to replace advice given to you by your health care provider. Make sure you discuss any questions you have with your health care provider. Document Released: 02/09/2009 Document Revised: 09/21/2015 Document Reviewed: 05/20/2014 Elsevier Interactive Patient Education  2017 [employer].

## 2022-05-27 NOTE — Progress Notes (Signed)
Subjective:   Sylvia [name] is a 77 y.o. female who presents for Medicare Annual (Subsequent) preventive examination.  Review of Systems     Cardiac Risk Factors include: family history of premature cardiovascular disease;advanced age (>67mn, >>25women);sedentary lifestyle;dyslipidemia     Objective:    Today's Vitals   05/27/22 1047 05/27/22 1117  BP: 110/70   Pulse: 70   Temp: 97.9 F (36.6 C)   TempSrc: Temporal   SpO2: 97%   Weight: 152 lb (68.9 kg)   Height: '5\' 3"'$  (1.6 m)   PainSc:  2    Body mass index is 26.93 kg/m.     05/27/2022    8:42 AM 01/24/2022    1:02 PM 11/23/2021   11:29 AM 07/20/2021    9:37 AM 05/24/2021   10:44 AM 04/13/2021    9:47 AM 10/11/2020   10:05 AM  Advanced Directives  Does Patient Have a Medical Advance Directive? Yes Yes Yes Yes Yes Yes Yes  Type of AParamedicof AAtlantaLiving will HHoytvilleLiving will HLoma GrandeLiving will HShelbyLiving will HSedro-WoolleyLiving will HMillingtonLiving will HShilohLiving will  Does patient want to make changes to medical advance directive? No - Patient declined  No - Patient declined No - Patient declined No - Patient declined No - Patient declined No - Patient declined  Copy of HChristmasin Chart? Yes - validated most recent copy scanned in chart (See row information) Yes - validated most recent copy scanned in chart (See row information) Yes - validated most recent copy scanned in chart (See row information) Yes - validated most recent copy scanned in chart (See row information) Yes - validated most recent copy scanned in chart (See row information) Yes - validated most recent copy scanned in chart (See row information) Yes - validated most recent copy scanned in chart (See row information)    Current Medications (verified) Outpatient Encounter  Medications as of 05/27/2022  Medication Sig   acetaminophen (TYLENOL) 500 MG tablet Take 2 tablets (1,000 mg total) by mouth every 8 (eight) hours as needed for mild pain.   Ca Phosphate-Cholecalciferol (CALCIUM WITH D3) 250-12.5 MG-MCG CHEW Chew 1 Chip by mouth daily.   Cholecalciferol (D3 5000) 125 MCG (5000 UT) capsule Take 5,000 Units by mouth daily.   Cyanocobalamin (VITAMIN B-12) 1000 MCG SUBL Place 1 tablet under the tongue daily. At breakfast   ezetimibe (ZETIA) 10 MG tablet TAKE 1 TABLET BY MOUTH EVERY DAY   levothyroxine (SYNTHROID) 75 MCG tablet TAKE 1 TABLET BY MOUTH EVERY DAY BEFORE BREAKFAST   Magnesium 250 MG TABS Take by mouth daily at 6 (six) AM.   OVER THE COUNTER MEDICATION Take 2 capsules by mouth daily. Serotonin Mood Support   vitamin E 400 UNIT capsule Take 800 Units by mouth daily.    [DISCONTINUED] OVER THE COUNTER MEDICATION Comfort Guard ( [place] powder, [place] poweder, Ginger) (Patient not taking: Reported on 05/27/2022)   No facility-administered encounter medications on file as of 05/27/2022.    Allergies (verified) Benadryl [diphenhydramine hcl], Tape, Boniva [ibandronate sodium], Peridin-c [ascorbic acid], Venlafaxine, Chlorhexidine gluconate, Codeine, and Sulfa antibiotics   History: Past Medical History:  Diagnosis Date   Aphasia    Arthritis    OA- fingers, toes, back pain, hip pain    Breast cancer of upper-outer quadrant of left female breast ([place]    left breast cancer  Complication of anesthesia    slow to wake up   Disorder of bone and cartilage, unspecified    Diverticulitis    Hypothyroid    Lumbago    Malignant neoplasm of breast (female), unspecified site    Normal nuclear stress test    [facility], 07/18/2011   Osteoarthritis (arthritis due to wear and tear of joints)    Pain in joint, pelvic region and thigh    Reflux esophagitis    pt. knowledgeable about dietary intake relative to reflux   S/P bilateral breast biopsy 2013    Shortness of breath    /w housework   Symptomatic menopausal or female climacteric states    Past Surgical History:  Procedure Laterality Date   APPENDECTOMY     BREAST BIOPSY  07/26/2011   Procedure: BREAST BIOPSY WITH NEEDLE LOCALIZATION;  Surgeon: Rolm Bookbinder, MD;  Location: [place] [place];  Service: General;  Laterality: Right;  Right breast wire guided biospy Needle localization @ [facility]  7:30   BREAST SURGERY     Procedure: BREAST BIOPSY WITH NEEDLE LOCALIZATION;  [place]   CATARACT EXTRACTION  2012   DILATION AND CURETTAGE OF [place]     R cat. extracted /w IOL   LAPAROSCOPIC APPENDECTOMY N/A 03/03/2019   Procedure: APPENDECTOMY LAPAROSCOPIC;  Surgeon: Armandina Gemma, MD;  Location: [facility];  Service: General;  Laterality: N/A;   OVARIAN CYST SURGERY     TONSILLECTOMY     as a child   Family History  Problem Relation Age of Onset   Cancer Sister        colon   Cancer Brother        renal   Heart disease Brother    Cancer Mother        leukemia   Peripheral vascular disease Mother    Cancer Father        lung   Heart attack Brother    Cancer Maternal Aunt        breast   Cancer Cousin    Cancer Cousin    Anesthesia problems Neg Hx    Social History   Socioeconomic History   Marital status: Married    Spouse name: Not on file   Number of children: Not on file   Years of education: Not on file   Highest education level: Not on file  Occupational History   Not on file  Tobacco Use   Smoking status: Former    Packs/day: 0.25    Types: Cigarettes    Quit date: 07/22/1980    Years since quitting: 41.8   Smokeless tobacco: Never  Vaping Use   Vaping Use: Never used  Substance and Sexual Activity   Alcohol use: No   Drug use: No   Sexual activity: Not Currently    Birth control/protection: None  Other Topics Concern   Not on file  Social History Narrative   Not on file   Social Determinants of Health    Financial Resource Strain: Low Risk  (04/11/2017)   Overall Financial Resource Strain (CARDIA)    Difficulty of Paying Living Expenses: Not hard at all  Food Insecurity: No Food Insecurity (04/11/2017)   Hunger Vital Sign    Worried About Estate manager/land agent of Food in the Last Year: Never true    Ran Out of Food in the Last Year: Never true  Transportation Needs: No Transportation Needs (  04/11/2017)   PRAPARE - Hydrologist (Medical): No    Lack of Transportation (Non-Medical): No  Physical Activity: Inactive (04/14/2018)   Exercise Vital Sign    Days of Exercise per Week: 0 days    Minutes of Exercise per Session: 0 min  Stress: Stress Concern Present (04/11/2017)   [place]    Feeling of Stress : Rather much  Social Connections: Somewhat Isolated (04/11/2017)   Social Connection and Isolation Panel [NHANES]    Frequency of Communication with Friends and Family: Three times a week    Frequency of Social Gatherings with Friends and Family: More than three times a week    Attends Religious Services: Never    Marine scientist or Organizations: No    Attends Music therapist: Never    Marital Status: Married    Tobacco Counseling Counseling given: Not Answered   Clinical Intake:  Pre-visit preparation completed: Yes  Pain : 0-10 Pain Score: 2  Pain Type: Acute pain Pain Location: Knee Pain Orientation: Right Pain Descriptors / Indicators: Aching Pain Onset: More than a month ago Pain Frequency: Intermittent     BMI - recorded: 26 Nutritional Status: BMI 25 -29 Overweight Nutritional Risks: None Diabetes: No  How often do you need to have someone help you when you read instructions, pamphlets, or other written materials from your doctor or pharmacy?: 1 - Never  Diabetic?no         Activities of Daily Living    05/27/2022   11:15 AM  In your  present state of health, do you have any difficulty performing the following activities:  Hearing? 0  Vision? 0  Difficulty concentrating or making decisions? 0  Walking or climbing stairs? 0  Dressing or bathing? 0  Doing errands, shopping? 0  Preparing Food and eating ? N  Using the Toilet? N  In the past six months, have you accidently leaked urine? N  Do you have problems with loss of bowel control? N  Managing your Medications? N  Managing your Finances? N  Housekeeping or managing your Housekeeping? N    Patient Care Team: Lauree Chandler, NP as PCP - General (Geriatric Medicine) Starlyn Skeans as Physician Assistant (Dermatology) Magrinat, Virgie Dad, MD (Inactive) as Consulting Physician (Oncology) Wilford Corner, MD as Consulting Physician (Gastroenterology) Odette Fraction [facility])  Indicate any recent Medical Services you may have received from other than [facility] providers in the past year (date may be approximate).     Assessment:   This is a routine wellness examination for [facility].  Hearing/Vision screen Hearing Screening - Comments:: Ok to hear other than certain programs on TV  Vision Screening - Comments:: Last eye exam less than 12 months ago, Dr.Thurmond. [place] issues and exercise activities discussed: Current Exercise Habits: Home exercise routine, Frequency (Times/Week): 3   Goals Addressed   None   Depression Screen    05/27/2022   10:50 AM 07/20/2021   11:03 AM 05/24/2021   10:40 AM 04/13/2021   10:31 AM 04/06/2020   11:19 AM 04/20/2019    9:28 AM 03/29/2019   12:59 PM  PHQ 2/9 Scores  PHQ - 2 Score 0 0 0  0 0 0  Exception Documentation    Other- indicate reason in comment box     Not completed    seeing grief counslor through hospice  Fall Risk    05/27/2022   10:50 AM 01/24/2022    1:02 PM 11/23/2021    1:26 PM 07/20/2021   11:03 AM 06/04/2021    9:34 AM  Fall Risk   Falls in the past  year? 0 0 0 0 0  Number falls in past yr: 0 0 0 0 0  Injury with Fall? 0 0 0 0 0  Risk for fall due to : No Fall Risks No Fall Risks No Fall Risks No Fall Risks No Fall Risks  Follow up Falls evaluation completed Falls evaluation completed Falls evaluation completed Falls evaluation completed Falls evaluation completed    FALL RISK PREVENTION PERTAINING TO THE HOME:  Any stairs in or around the home? Yes  If so, are there any without handrails? No  Home free of loose throw rugs in walkways, pet beds, electrical cords, etc? Yes  Adequate lighting in your home to reduce risk of falls? Yes   ASSISTIVE DEVICES UTILIZED TO PREVENT FALLS:  Life alert? No  Use of a cane, walker or w/c? No  Grab bars in the bathroom? Yes  Shower chair or bench in shower? Yes  Elevated toilet seat or a handicapped toilet? No   TIMED UP AND GO:  Was the test performed? No .   Gait steady and fast without use of assistive device  Cognitive Function:    05/27/2022   10:57 AM 05/08/2020    1:00 PM 04/14/2018   10:06 AM 04/11/2017   11:39 AM 04/11/2016   10:21 AM  MMSE - Mini Mental State Exam  Orientation to time '5 5 5 4 5  '$ Orientation to Place '4 5 5 5 5  '$ Registration '3 3 3 3 3  '$ Attention/ Calculation '5 2 5 5 5  '$ Recall '3 3 3 3 3  '$ Language- name 2 objects '2 2 2 2 2  '$ Language- repeat '1 1 1 1 1  '$ Language- follow 3 step command '3 3 3 3 3  '$ Language- read & follow direction '1 1 1 1 1  '$ Write a sentence '1 1 1 1 1  '$ Copy design '1 1 1 1 1  '$ Total score '29 27 30 29 30        '$ 05/24/2021   10:44 AM 04/20/2019    9:28 AM  6CIT Screen  What Year? 0 points 0 points  What month? 0 points 0 points  What time? 0 points 0 points  Count back from 20 0 points 0 points  Months in reverse 0 points 0 points  Repeat phrase 10 points 0 points  Total Score 10 points 0 points    Immunizations Immunization History  Administered Date(s) Administered   Fluad Quad(high Dose 65+) 02/02/2019, 02/13/2021, 03/20/2022    Influenza Whole 03/18/2014   Influenza, High Dose Seasonal PF 02/10/2017, 02/09/2018, 02/07/2020   Influenza, Seasonal, Injecte, Preservative Fre 02/01/2016   Influenza,inj,quad, With Preservative 08/27/2020   Influenza-Unspecified 02/05/2010, 03/30/2012, 04/02/2013, 02/21/2015, 02/07/2020   PFIZER(Purple Top)SARS-COV-2 Vaccination 05/18/2019, 06/07/2019, 02/10/2020   Pneumococcal Conjugate-13 04/08/2014   Pneumococcal Polysaccharide-23 09/28/2012   Td 04/29/1978   Tdap 04/15/2013   Zoster Recombinat (Shingrix) 06/25/2021, 09/10/2021   Zoster, Live 01/24/2014    TDAP status: Up to date  Flu Vaccine status: Up to date  Pneumococcal vaccine status: Up to date  Covid-19 vaccine status: Information provided on how to obtain vaccines.   Qualifies for Shingles Vaccine? Yes   Zostavax completed No   Shingrix Completed?: Yes  Screening Tests Health Maintenance  Topic Date Due   COLONOSCOPY (Pts 45-58yr Insurance coverage will need to be confirmed)  12/31/2022   DTaP/Tdap/Td (3 - Td or Tdap) 04/16/2023   MAMMOGRAM  04/16/2023   Medicare Annual Wellness (AWV)  05/28/2023   Pneumonia Vaccine 77 Years old  Completed   INFLUENZA VACCINE  Completed   DEXA SCAN  Completed   Hepatitis C Screening  Completed   Zoster Vaccines- Shingrix  Completed   HPV VACCINES  Aged Out   COVID-19 Vaccine  Discontinued    Health Maintenance  There are no preventive care reminders to display for this patient.   Colorectal cancer screening: Type of screening: Colonoscopy. Completed 2019. Repeat every 5 years  Mammogram status: Completed 04/16/23. Repeat every year  Bone Density status: Completed 3/22. Results reflect: Bone density results: OSTEOPENIA. Repeat every 2 years.  Lung Cancer Screening: (Low Dose CT Chest recommended if Age 78-80years, 30 pack-year currently smoking OR have quit w/in 15years.) does not qualify.   Lung Cancer Screening Referral: na  Additional  Screening:  Hepatitis C Screening: does qualify; Completed 2017  Vision Screening: Recommended annual ophthalmology exams for early detection of glaucoma and other disorders of the eye. Is the patient up to date with their annual eye exam?  Yes  Who is the provider or what is the name of the office in which the patient attends annual eye exams? Dr TJoya SanIf pt is not established with a provider, would they like to be referred to a provider to establish care? No .   Dental Screening: Recommended annual dental exams for proper oral hygiene  Community Resource Referral / Chronic Care Management: CRR required this visit?  No   CCM required this visit?  No      Plan:     I have personally reviewed and noted the following in the patient's chart:   Medical and social history Use of alcohol, tobacco or illicit drugs  Current medications and supplements including opioid prescriptions. Patient is not currently taking opioid prescriptions. Functional ability and status Nutritional status Physical activity Advanced directives List of other physicians Hospitalizations, surgeries, and ER visits in previous 12 months Vitals Screenings to include cognitive, depression, and falls Referrals and appointments  In addition, I have reviewed and discussed with patient certain preventive protocols, quality metrics, and best practice recommendations. A written personalized care plan for preventive services as well as general preventive health recommendations were provided to patient.     JLauree Chandler NP   05/27/2022   [facility]

## 2022-05-28 DIAGNOSIS — M5431 Sciatica, right side: Secondary | ICD-10-CM | POA: Diagnosis not present

## 2022-06-04 ENCOUNTER — Telehealth: Payer: Self-pay

## 2022-06-04 NOTE — Telephone Encounter (Signed)
Attempted to call patient to scheduled AWV. No answer. Patient needs to scheduled for after  05/28/23

## 2022-06-12 ENCOUNTER — Encounter: Payer: Self-pay | Admitting: Orthopedic Surgery

## 2022-06-12 ENCOUNTER — Ambulatory Visit (INDEPENDENT_AMBULATORY_CARE_PROVIDER_SITE_OTHER): Payer: Medicare Other | Admitting: Orthopedic Surgery

## 2022-06-12 VITALS — BP 130/80 | HR 77 | Temp 97.7°F | Resp 18 | Ht 63.0 in | Wt 152.0 lb

## 2022-06-12 DIAGNOSIS — R300 Dysuria: Secondary | ICD-10-CM

## 2022-06-12 DIAGNOSIS — K625 Hemorrhage of anus and rectum: Secondary | ICD-10-CM | POA: Diagnosis not present

## 2022-06-12 DIAGNOSIS — K921 Melena: Secondary | ICD-10-CM | POA: Diagnosis not present

## 2022-06-12 DIAGNOSIS — K649 Unspecified hemorrhoids: Secondary | ICD-10-CM | POA: Diagnosis not present

## 2022-06-12 LAB — POCT URINALYSIS DIPSTICK
Appearance: NORMAL
Blood, UA: NEGATIVE
Glucose, UA: NEGATIVE
Ketones, UA: NEGATIVE
Nitrite, UA: NEGATIVE
Protein, UA: NEGATIVE
Spec Grav, UA: 1.015 (ref 1.010–1.025)
Urobilinogen, UA: 0.2 E.U./dL
pH, UA: 7 (ref 5.0–8.0)

## 2022-06-12 NOTE — Progress Notes (Addendum)
Careteam: Patient Care Team: Lauree Chandler, NP as PCP - General (Geriatric Medicine) Warren Danes, PA-C as Physician Assistant (Dermatology) Magrinat, Virgie Dad, MD (Inactive) as Consulting Physician (Oncology) Wilford Corner, MD as Consulting Physician (Gastroenterology) Odette Fraction (Optometry)  Seen by: Windell Moulding, AGNP-C  PLACE OF SERVICE:  [place]  Advanced Directive information    Allergies  Allergen Reactions   Benadryl [Diphenhydramine Hcl] Other (See Comments)    "Super hyper"   Tape     Skin breakdown/redness    Boniva [Ibandronate Sodium] Other (See Comments)    Neck, shoulder limited mobility    Peridin-C [Ascorbic Acid] Diarrhea   Venlafaxine Nausea Only   Chlorhexidine Gluconate Itching   Codeine Other (See Comments)    "can't wake up from it."   Sulfa Antibiotics Rash    Patient unsure of reaction    Chief Complaint  Patient presents with   Acute Visit    Patient is being seen for dysuria and possible blood in stool     HPI: Patient is a 77 y.o. female seen today for abnormal rectal bleeding.   This morning she had a bowel movement and noticed bright red blood in toilet. Denies abdominal pain, black tarry stools or hard stools. Admits to mild dysuria during episode as well. A few hours later she had another bowel movement and it was normal. Reports sister had colon cancer. She is UTD with colonoscopy. Polyps found last routine procedure> f/u study in 5 years> she plans to have colonoscopy 12/2022. She has not seen gynecology ina few years. Admits to hemorrhoids as well. She does not take stool softener. She is not on anticoagulant. Hgb 14.3 03/20/2022.   Denies dysuria at this time. Admits to drinking water poorly at times. Last urine culture 01/24/2022 negative for growth.     Review of Systems:  Review of Systems  Constitutional:  Negative for fever and malaise/fatigue.  HENT: Negative.    Eyes: Negative.   Respiratory:   Negative for cough, shortness of breath and wheezing.   Cardiovascular:  Negative for chest pain and leg swelling.  Gastrointestinal:  Positive for blood in stool. Negative for abdominal pain, constipation, diarrhea, heartburn, nausea and vomiting.  Genitourinary:  Positive for dysuria.  Musculoskeletal:  Negative for falls and joint pain.  Skin: Negative.   Neurological:  Negative for dizziness and weakness.  Psychiatric/Behavioral:  Negative for depression. The patient is not nervous/anxious.     Past Medical History:  Diagnosis Date   Aphasia    Arthritis    OA- fingers, toes, back pain, hip pain    Breast cancer of upper-outer quadrant of left female breast (HCC)    left breast cancer   Complication of anesthesia    slow to wake up   Disorder of bone and cartilage, unspecified    Diverticulitis    Hypothyroid    Lumbago    Malignant neoplasm of breast (female), unspecified site    Normal nuclear stress test    [facility], 07/18/2011   Osteoarthritis (arthritis due to wear and tear of joints)    Pain in joint, pelvic region and thigh    Reflux esophagitis    pt. knowledgeable about dietary intake relative to reflux   S/P bilateral breast biopsy 2013   Shortness of breath    /w housework   Symptomatic menopausal or female climacteric states    Past Surgical History:  Procedure Laterality Date   APPENDECTOMY  BREAST BIOPSY  07/26/2011   Procedure: BREAST BIOPSY WITH NEEDLE LOCALIZATION;  Surgeon: Rolm Bookbinder, MD;  Location: [facility];  Service: General;  Laterality: Right;  Right breast wire guided biospy Needle localization @ [facility]  7:30   BREAST SURGERY     Procedure: BREAST BIOPSY WITH NEEDLE LOCALIZATION;  [facility]   CATARACT EXTRACTION  2012   DILATION AND CURETTAGE OF UTERUS  1978   ECTOPIC PREGNANCY SURGERY     1980   EYE SURGERY     R cat. extracted /w IOL   LAPAROSCOPIC APPENDECTOMY N/A 03/03/2019   Procedure: APPENDECTOMY LAPAROSCOPIC;  Surgeon: Armandina Gemma,  MD;  Location: [facility];  Service: General;  Laterality: N/A;   OVARIAN CYST SURGERY     TONSILLECTOMY     as a child   Social History:   reports that she quit smoking about 41 years ago. Her smoking use included cigarettes. She smoked an average of .25 packs per day. She has never used smokeless tobacco. She reports that she does not drink alcohol and does not use drugs.  Family History  Problem Relation Age of Onset   Cancer Sister        colon   Cancer Brother        renal   Heart disease Brother    Cancer Mother        leukemia   Peripheral vascular disease Mother    Cancer Father        lung   Heart attack Brother    Cancer Maternal Aunt        breast   Cancer Cousin    Cancer Cousin    Anesthesia problems Neg Hx     Medications: Patient's Medications  New Prescriptions   No medications on file  Previous Medications   ACETAMINOPHEN (TYLENOL) 500 MG TABLET    Take 2 tablets (1,000 mg total) by mouth every 8 (eight) hours as needed for mild pain.   CA PHOSPHATE-CHOLECALCIFEROL (CALCIUM WITH D3) 250-12.5 MG-MCG CHEW    Chew 1 Chip by mouth daily.   CHOLECALCIFEROL (D3 5000) 125 MCG (5000 UT) CAPSULE    Take 5,000 Units by mouth daily.   CYANOCOBALAMIN (VITAMIN B-12) 1000 MCG SUBL    Place 1 tablet under the tongue daily. At breakfast   EZETIMIBE (ZETIA) 10 MG TABLET    TAKE 1 TABLET BY MOUTH EVERY DAY   LEVOTHYROXINE (SYNTHROID) 75 MCG TABLET    TAKE 1 TABLET BY MOUTH EVERY DAY BEFORE BREAKFAST   MAGNESIUM 250 MG TABS    Take by mouth daily at 6 (six) AM.   OVER THE COUNTER MEDICATION    Take 2 capsules by mouth daily. Serotonin Mood Support   VITAMIN E 400 UNIT CAPSULE    Take 800 Units by mouth daily.   Modified Medications   No medications on file  Discontinued Medications   No medications on file    Physical Exam:  Vitals:   06/12/22 1530  BP: 130/80  Pulse: 77  Resp: 18  Temp: 97.7 F (36.5 C)  SpO2: 98%  Weight: 152 lb (68.9 kg)  Height: 5' 3"$  (1.6 m)    Body mass index is 26.93 kg/m. Wt Readings from Last 3 Encounters:  06/12/22 152 lb (68.9 kg)  05/27/22 152 lb (68.9 kg)  05/06/22 150 lb (68 kg)    Physical Exam Vitals reviewed. Exam conducted with a chaperone present.  Constitutional:      General: She is not in acute  distress. HENT:     Head: Normocephalic.  Eyes:     General:        Right eye: No discharge.        Left eye: No discharge.  Cardiovascular:     Rate and Rhythm: Normal rate and regular rhythm.     Pulses: Normal pulses.     Heart sounds: Normal heart sounds.  Pulmonary:     Effort: Pulmonary effort is normal.     Breath sounds: Normal breath sounds.  Abdominal:     General: Bowel sounds are normal. There is no distension.     Palpations: Abdomen is soft. There is no mass.     Tenderness: There is no abdominal tenderness. There is no right CVA tenderness, left CVA tenderness or guarding.     Hernia: No hernia is present.  Genitourinary:    Urethra: No prolapse or urethral swelling.     Rectum: Internal hemorrhoid present.     Comments: Small internal hemorrhoid at 4 o'clock, no residual blood noted on rectal exam, patient tolerated exam well Musculoskeletal:     Cervical back: Neck supple.  Skin:    General: Skin is warm.     Capillary Refill: Capillary refill takes less than 2 seconds.  Neurological:     General: No focal deficit present.     Mental Status: She is alert and oriented to person, place, and time.  Psychiatric:        Mood and Affect: Mood normal.        Behavior: Behavior normal.     Labs reviewed: Basic Metabolic Panel: Recent Labs    03/20/22 0845  NA 143  K 5.0  CL 108  CO2 28  GLUCOSE 88  BUN 14  CREATININE 0.81  CALCIUM 9.2  TSH 5.78*   Liver Function Tests: Recent Labs    03/20/22 0845  AST 14  ALT 9  BILITOT 0.7  PROT 7.1   No results for input(s): "LIPASE", "AMYLASE" in the last 8760 hours. No results for input(s): "AMMONIA" in the last 8760  hours. CBC: Recent Labs    03/20/22 0845  WBC 3.5*  NEUTROABS 1,659  HGB 14.3  HCT 41.9  MCV 92.9  PLT 236   Lipid Panel: Recent Labs    03/20/22 0845  CHOL 208*  HDL 71  LDLCALC 115*  TRIG 111  CHOLHDL 2.9   TSH: Recent Labs    03/20/22 0845  TSH 5.78*   A1C: No results found for: "HGBA1C"   Assessment/Plan: 1. Rectal bleeding - noted today after bowel movement, second BM normal with no bleeding - denies black stools, abdominal pain, hard stools - exam unremarkable except for small internal hemorrhoid - scheduled to have colonoscopy 12/2022> sister had colon cancer - cbc/diff - hemoccult> patient to return sample 02/15 - may consider gynecology exam if hemoccults normal - CBC with Differential/Platelet - Fecal Globin By Immunochemistry  2. Dysuria - onset x 1 day - POC Urinalysis Dipstick - Culture, Urine  3. Hemorrhoids, unspecified hemorrhoid type - asymptomatic - denies hard stools  - encouraged to drink more water  Total time: 31 minutes. Greater than 50% of total time spent doing patient education regarding rectal bleeding and dysuria including symptom/treatment/medication management.     Next appt: Visit date not found  [facility] [phone]

## 2022-06-12 NOTE — Patient Instructions (Addendum)
Please give stool sample in specimen cup and return to office   Please let provider know if blood returns at any time.   Recommend increasing oral hydration> prevents urinary symptoms and prevents constipation.

## 2022-06-13 DIAGNOSIS — K625 Hemorrhage of anus and rectum: Secondary | ICD-10-CM | POA: Diagnosis not present

## 2022-06-13 LAB — URINE CULTURE
MICRO NUMBER:: 14564913
SPECIMEN QUALITY:: ADEQUATE

## 2022-06-13 LAB — CBC WITH DIFFERENTIAL/PLATELET
Absolute Monocytes: 290 cells/uL (ref 200–950)
Basophils Absolute: 30 cells/uL (ref 0–200)
Basophils Relative: 0.6 %
Eosinophils Absolute: 80 cells/uL (ref 15–500)
Eosinophils Relative: 1.6 %
HCT: 40.6 % (ref 35.0–45.0)
Hemoglobin: 13.7 g/dL (ref 11.7–15.5)
Lymphs Abs: 1905 cells/uL (ref 850–3900)
MCH: 31.6 pg (ref 27.0–33.0)
MCHC: 33.7 g/dL (ref 32.0–36.0)
MCV: 93.8 fL (ref 80.0–100.0)
MPV: 9.6 fL (ref 7.5–12.5)
Monocytes Relative: 5.8 %
Neutro Abs: 2695 cells/uL (ref 1500–7800)
Neutrophils Relative %: 53.9 %
Platelets: 252 10*3/uL (ref 140–400)
RBC: 4.33 10*6/uL (ref 3.80–5.10)
RDW: 12.8 % (ref 11.0–15.0)
Total Lymphocyte: 38.1 %
WBC: 5 10*3/uL (ref 3.8–10.8)

## 2022-06-14 LAB — FECAL GLOBIN BY IMMUNOCHEMISTRY
FECAL GLOBIN RESULT:: NOT DETECTED
MICRO NUMBER:: 14570424
SPECIMEN QUALITY:: ADEQUATE

## 2022-06-21 ENCOUNTER — Encounter: Payer: Self-pay | Admitting: Nurse Practitioner

## 2022-06-21 ENCOUNTER — Ambulatory Visit (INDEPENDENT_AMBULATORY_CARE_PROVIDER_SITE_OTHER): Payer: Medicare Other | Admitting: Nurse Practitioner

## 2022-06-21 VITALS — BP 132/86 | HR 63 | Temp 95.1°F | Ht 63.0 in | Wt 153.8 lb

## 2022-06-21 DIAGNOSIS — K649 Unspecified hemorrhoids: Secondary | ICD-10-CM | POA: Diagnosis not present

## 2022-06-21 DIAGNOSIS — K625 Hemorrhage of anus and rectum: Secondary | ICD-10-CM | POA: Diagnosis not present

## 2022-06-21 DIAGNOSIS — M5431 Sciatica, right side: Secondary | ICD-10-CM | POA: Diagnosis not present

## 2022-06-21 NOTE — Progress Notes (Signed)
Careteam: Patient Care Team: Lauree Chandler, NP as PCP - General (Geriatric Medicine) Warren Danes, PA-C as Physician Assistant (Dermatology) Magrinat, Virgie Dad, MD (Inactive) as Consulting Physician (Oncology) Wilford Corner, MD as Consulting Physician (Gastroenterology) Odette Fraction (Optometry)  PLACE OF SERVICE:  [place]  Advanced Directive information    Allergies  Allergen Reactions   Benadryl [Diphenhydramine Hcl] Other (See Comments)    "Super hyper"   Tape     Skin breakdown/redness    Boniva [Ibandronate Sodium] Other (See Comments)    Neck, shoulder limited mobility    Peridin-C [Ascorbic Acid] Diarrhea   Venlafaxine Nausea Only   Chlorhexidine Gluconate Itching   Codeine Other (See Comments)    "can't wake up from it."   Sulfa Antibiotics Rash    Patient unsure of reaction    Chief Complaint  Patient presents with   Acute Visit    Patient presents today for 1 week rectal bleeding follow up. She reports improved symptoms.     HPI: Patient is a 77 y.o. female here for f/u of rectal bleeding.   Patient denies any bleeding at this time. She has not had any more bleeding since prior to her last visit here. She denies abdominal pain. She feels fine. She has hard stools sometimes. Denies dizziness or lightheadedness, fatigue.   She is still concerned about the bleeding. Family wants her to see a GYN to ensure it was not vaginal bleeding.   Continues to have pain to right upper leg. Therapy is helping but still has stiffness and pain.   Review of Systems:  Review of Systems  Constitutional:  Negative for chills, fever, malaise/fatigue and weight loss.  HENT:  Negative for congestion and sore throat.   Eyes:  Negative for blurred vision.  Respiratory:  Negative for cough, shortness of breath and wheezing.   Cardiovascular:  Negative for chest pain, palpitations and leg swelling.  Gastrointestinal:  Negative for abdominal pain, blood  in stool, constipation, diarrhea, heartburn, nausea and vomiting.  Genitourinary:  Negative for dysuria, frequency, hematuria and urgency.  Musculoskeletal:  Negative for falls and joint pain.  Skin:  Negative for rash.  Neurological:  Negative for dizziness, tingling and headaches.  Endo/Heme/Allergies:  Negative for polydipsia.  Psychiatric/Behavioral:  Negative for depression. The patient is not nervous/anxious.    Past Medical History:  Diagnosis Date   Aphasia    Arthritis    OA- fingers, toes, back pain, hip pain    Breast cancer of upper-outer quadrant of left female breast (HCC)    left breast cancer   Complication of anesthesia    slow to wake up   Disorder of bone and cartilage, unspecified    Diverticulitis    Hypothyroid    Lumbago    Malignant neoplasm of breast (female), unspecified site    Normal nuclear stress test    [facility], 07/18/2011   Osteoarthritis (arthritis due to wear and tear of joints)    Pain in joint, pelvic region and thigh    Reflux esophagitis    pt. knowledgeable about dietary intake relative to reflux   S/P bilateral breast biopsy 2013   Shortness of breath    /w housework   Symptomatic menopausal or female climacteric states    Past Surgical History:  Procedure Laterality Date   APPENDECTOMY     BREAST BIOPSY  07/26/2011   Procedure: BREAST BIOPSY WITH NEEDLE LOCALIZATION;  Surgeon: Rolm Bookbinder, MD;  Location: [facility];  Service: General;  Laterality: Right;  Right breast wire guided biospy Needle localization @ [facility]  7:30   BREAST SURGERY     Procedure: BREAST BIOPSY WITH NEEDLE LOCALIZATION;  [place]   CATARACT EXTRACTION  2012   DILATION AND CURETTAGE OF UTERUS  1978   ECTOPIC PREGNANCY SURGERY     1980   EYE SURGERY     R cat. extracted /w IOL   LAPAROSCOPIC APPENDECTOMY N/A 03/03/2019   Procedure: APPENDECTOMY LAPAROSCOPIC;  Surgeon: Armandina Gemma, MD;  Location: [facility];  Service: General;  Laterality: N/A;   OVARIAN CYST SURGERY      TONSILLECTOMY     as a child   Social History:   reports that she quit smoking about 41 years ago. Her smoking use included cigarettes. She smoked an average of .25 packs per day. She has never used smokeless tobacco. She reports that she does not drink alcohol and does not use drugs.  Family History  Problem Relation Age of Onset   Cancer Sister        colon   Cancer Brother        renal   Heart disease Brother    Cancer Mother        leukemia   Peripheral vascular disease Mother    Cancer Father        lung   Heart attack Brother    Cancer Maternal Aunt        breast   Cancer Cousin    Cancer Cousin    Anesthesia problems Neg Hx     Medications: Patient's Medications  New Prescriptions   No medications on file  Previous Medications   ACETAMINOPHEN (TYLENOL) 500 MG TABLET    Take 2 tablets (1,000 mg total) by mouth every 8 (eight) hours as needed for mild pain.   CA PHOSPHATE-CHOLECALCIFEROL (CALCIUM WITH D3) 250-12.5 MG-MCG CHEW    Chew 1 Chip by mouth daily.   CHOLECALCIFEROL (D3 5000) 125 MCG (5000 UT) CAPSULE    Take 5,000 Units by mouth daily.   CYANOCOBALAMIN (VITAMIN B-12) 1000 MCG SUBL    Place 1 tablet under the tongue daily. At breakfast   EZETIMIBE (ZETIA) 10 MG TABLET    TAKE 1 TABLET BY MOUTH EVERY DAY   LEVOTHYROXINE (SYNTHROID) 75 MCG TABLET    TAKE 1 TABLET BY MOUTH EVERY DAY BEFORE BREAKFAST   MAGNESIUM 250 MG TABS    Take by mouth daily at 6 (six) AM.   OVER THE COUNTER MEDICATION    Take 2 capsules by mouth daily. Serotonin Mood Support   VITAMIN E 400 UNIT CAPSULE    Take 800 Units by mouth daily.   Modified Medications   No medications on file  Discontinued Medications   No medications on file    Physical Exam:  Vitals:   06/21/22 0947  BP: 132/86  Pulse: 63  Temp: (!) 95.1 F (35.1 C)  SpO2: 99%  Weight: 153 lb 12.8 oz (69.8 kg)  Height: '5\' 3"'$  (1.6 m)   Body mass index is 27.24 kg/m. Wt Readings from Last 3 Encounters:  06/21/22  153 lb 12.8 oz (69.8 kg)  06/12/22 152 lb (68.9 kg)  05/27/22 152 lb (68.9 kg)    Physical Exam Constitutional:      General: She is not in acute distress.    Appearance: Normal appearance.  Cardiovascular:     Rate and Rhythm: Normal rate and regular rhythm.  Pulmonary:     Effort: No  respiratory distress.     Breath sounds: Normal breath sounds.  Abdominal:     General: Bowel sounds are normal. There is no distension.     Palpations: Abdomen is soft. There is no mass.     Tenderness: There is no abdominal tenderness. There is no guarding.  Musculoskeletal:        General: No tenderness.     Cervical back: Neck supple.     Right hip: Normal.     Left hip: Normal.     Right upper leg: Normal. No tenderness.     Left upper leg: Normal. No tenderness.     Right lower leg: Normal. No edema.     Left lower leg: Normal. No edema.  Lymphadenopathy:     Cervical: No cervical adenopathy.  Skin:    General: Skin is warm and dry.  Neurological:     Mental Status: She is alert and oriented to person, place, and time.  Psychiatric:        Mood and Affect: Mood normal.     Labs reviewed: Basic Metabolic Panel: Recent Labs    03/20/22 0845  NA 143  K 5.0  CL 108  CO2 28  GLUCOSE 88  BUN 14  CREATININE 0.81  CALCIUM 9.2  TSH 5.78*   Liver Function Tests: Recent Labs    03/20/22 0845  AST 14  ALT 9  BILITOT 0.7  PROT 7.1   No results for input(s): "LIPASE", "AMYLASE" in the last 8760 hours. No results for input(s): "AMMONIA" in the last 8760 hours. CBC: Recent Labs    03/20/22 0845 06/12/22 1609  WBC 3.5* 5.0  NEUTROABS 1,659 2,695  HGB 14.3 13.7  HCT 41.9 40.6  MCV 92.9 93.8  PLT 236 252   Lipid Panel: Recent Labs    03/20/22 0845  CHOL 208*  HDL 71  LDLCALC 115*  TRIG 111  CHOLHDL 2.9   TSH: Recent Labs    03/20/22 0845  TSH 5.78*   A1C: No results found for: "HGBA1C"   Assessment/Plan 1. Rectal bleeding Appears to have resolved.  Patient remains anxious about the bleeding that occurred and would like further work-up with GYN. Should symptoms return, patient will contact the office. -she will make appt with GYN  2. Hemorrhoids, unspecified hemorrhoid type Pt reports no rectal discomfort or issues with hemorrhoids previously or now. Educated patient to continue drinking lots of water and use stool softeners if needed to maintain soft stools and avoid straining with bowel movements.   3. Sciatica of right side Ongoing, she did not have much benefit from therapy or NSAID -nontender on exam with full ROM, however increase pain with walking.  - Ambulatory referral to Sports Medicine for further evaluation and recommendations   Student- Kamna O'Berry ACPCNP-S  I personally was present during the history, physical exam and medical decision-making activities of this service and have verified that the service and findings are accurately documented in the student's note Breiona Couvillon K. [place], [facility] [phone]

## 2022-06-21 NOTE — Patient Instructions (Addendum)
[facility] Address: [address], [place], [place] [address] Phone: [phone]   [facility]  Address: [address], [place], [place] [address] Phone: [phone]

## 2022-06-26 NOTE — Progress Notes (Unsigned)
    Benito Mccreedy D.[place] Phone: [phone]   Assessment and Plan:     There are no diagnoses linked to this encounter.  ***   Pertinent previous records reviewed include ***   Follow Up: ***     Subjective:   I, Treshon Stannard, am serving as a Education administrator for Doctor Glennon Mac  Chief Complaint: right sided lower body pain   HPI:   06/27/2022 Patient is a 77 year old female complaining of right sided lower body pain. Patient states  Relevant Historical Information: ***  Additional pertinent review of systems negative.   Current Outpatient Medications:    acetaminophen (TYLENOL) 500 MG tablet, Take 2 tablets (1,000 mg total) by mouth every 8 (eight) hours as needed for mild pain., Disp: , Rfl:    Ca Phosphate-Cholecalciferol (CALCIUM WITH D3) 250-12.5 MG-MCG CHEW, Chew 1 Chip by mouth daily., Disp: , Rfl:    Cholecalciferol (D3 5000) 125 MCG (5000 UT) capsule, Take 5,000 Units by mouth daily., Disp: , Rfl:    Cyanocobalamin (VITAMIN B-12) 1000 MCG SUBL, Place 1 tablet under the tongue daily. At breakfast, Disp: , Rfl:    ezetimibe (ZETIA) 10 MG tablet, TAKE 1 TABLET BY MOUTH EVERY DAY, Disp: 90 tablet, Rfl: 2   levothyroxine (SYNTHROID) 75 MCG tablet, TAKE 1 TABLET BY MOUTH EVERY DAY BEFORE BREAKFAST, Disp: 90 tablet, Rfl: 3   Magnesium 250 MG TABS, Take by mouth daily at 6 (six) AM., Disp: , Rfl:    OVER THE COUNTER MEDICATION, Take 2 capsules by mouth daily. Serotonin Mood Support, Disp: , Rfl:    vitamin E 400 UNIT capsule, Take 800 Units by mouth daily. , Disp: , Rfl:    Objective:     There were no vitals filed for this visit.    There is no height or weight on file to calculate BMI.    Physical Exam:    ***   Electronically signed by:  Benito Mccreedy D.[facility] 12:01 PM 06/26/22

## 2022-06-27 ENCOUNTER — Ambulatory Visit (INDEPENDENT_AMBULATORY_CARE_PROVIDER_SITE_OTHER): Payer: Medicare Other

## 2022-06-27 ENCOUNTER — Ambulatory Visit (INDEPENDENT_AMBULATORY_CARE_PROVIDER_SITE_OTHER): Payer: Medicare Other | Admitting: Sports Medicine

## 2022-06-27 VITALS — BP 118/82 | HR 74 | Ht 63.0 in | Wt 151.0 lb

## 2022-06-27 DIAGNOSIS — M1611 Unilateral primary osteoarthritis, right hip: Secondary | ICD-10-CM

## 2022-06-27 DIAGNOSIS — M25551 Pain in right hip: Secondary | ICD-10-CM | POA: Diagnosis not present

## 2022-06-27 DIAGNOSIS — M7061 Trochanteric bursitis, right hip: Secondary | ICD-10-CM | POA: Diagnosis not present

## 2022-06-27 MED ORDER — MELOXICAM 15 MG PO TABS: 15.0000 mg | ORAL_TABLET | Freq: Every day | ORAL | 0 refills | Status: DC

## 2022-06-27 NOTE — Patient Instructions (Addendum)
Good to see you  - Start meloxicam 15 mg daily x2 weeks.  If still having pain after 2 weeks, complete 3rd-week of meloxicam. May use remaining meloxicam as needed once daily for pain control.  Do not to use additional NSAIDs while taking meloxicam.  May use Tylenol [id number] mg 2 to 3 times a day for breakthrough pain. Hip HEP  3-4 week follow up

## 2022-07-10 ENCOUNTER — Other Ambulatory Visit: Payer: Self-pay | Admitting: Nurse Practitioner

## 2022-07-10 DIAGNOSIS — E7800 Pure hypercholesterolemia, unspecified: Secondary | ICD-10-CM

## 2022-07-19 DIAGNOSIS — M8589 Other specified disorders of bone density and structure, multiple sites: Secondary | ICD-10-CM | POA: Diagnosis not present

## 2022-07-19 LAB — HM DEXA SCAN

## 2022-07-25 ENCOUNTER — Encounter: Payer: Self-pay | Admitting: Nurse Practitioner

## 2022-09-26 ENCOUNTER — Encounter: Payer: Self-pay | Admitting: Nurse Practitioner

## 2022-09-27 ENCOUNTER — Ambulatory Visit (INDEPENDENT_AMBULATORY_CARE_PROVIDER_SITE_OTHER): Payer: Medicare Other | Admitting: Nurse Practitioner

## 2022-09-27 ENCOUNTER — Encounter: Payer: Self-pay | Admitting: Nurse Practitioner

## 2022-09-27 VITALS — BP 114/70 | HR 71 | Temp 97.7°F | Ht 63.0 in | Wt 154.0 lb

## 2022-09-27 DIAGNOSIS — M159 Polyosteoarthritis, unspecified: Secondary | ICD-10-CM

## 2022-09-27 DIAGNOSIS — B372 Candidiasis of skin and nail: Secondary | ICD-10-CM | POA: Diagnosis not present

## 2022-09-27 DIAGNOSIS — E7800 Pure hypercholesterolemia, unspecified: Secondary | ICD-10-CM

## 2022-09-27 DIAGNOSIS — E039 Hypothyroidism, unspecified: Secondary | ICD-10-CM | POA: Diagnosis not present

## 2022-09-27 MED ORDER — NYSTATIN 100000 UNIT/GM EX CREA: 1.0000 | TOPICAL_CREAM | Freq: Two times a day (BID) | CUTANEOUS | 0 refills | Status: DC

## 2022-09-27 NOTE — Progress Notes (Signed)
Careteam: Patient Care Team: Sharon Seller, NP as PCP - General (Geriatric Medicine) Glyn Ade, PA-C as Physician Assistant (Dermatology) Magrinat, Valentino Hue, MD (Inactive) as Consulting Physician (Oncology) Charlott Rakes, MD as Consulting Physician (Gastroenterology) Marlene Bast (Optometry)  PLACE OF SERVICE:  [facility]  Advanced Directive information Does Patient Have a Medical Advance Directive?: Yes, Type of Advance Directive: Healthcare Power of [place];Living will, Does patient want to make changes to medical advance directive?: No - Patient declined  Allergies  Allergen Reactions   Benadryl [Diphenhydramine Hcl] Other (See Comments)    "Super hyper"   Tape     Skin breakdown/redness    Boniva [Ibandronate Sodium] Other (See Comments)    Neck, shoulder limited mobility    Peridin-C [Ascorbic Acid] Diarrhea   Venlafaxine Nausea Only   Chlorhexidine Gluconate Itching   Codeine Other (See Comments)    "can't wake up from it."   Sulfa Antibiotics Rash    Patient unsure of reaction    Chief Complaint  Patient presents with   Medical Management of Chronic Issues    6 month follow-up. Discuss weight gain and rash under breast. Patient would also like back examined and dermatology referral if needed.      HPI: Patient is a 77 y.o. female for routine follow up.   Reports she has gained weight. She is now walking 2-3 miles 4 days a week.  Clothes fit fine.  Worried about thyroid.   Reports one more episode of bleeding since last visit in April. Only on toilet tissue and in toilet with BM.   Back and hip pain doing well. No acute flare   Insomnia- some nights are better than other. Will take a tylenol PM if it is bad, will use maybe once a week.   Will get rash under breast, burns at times   Review of Systems:  Review of Systems  Constitutional:  Negative for chills, fever and weight loss.  HENT:  Negative for tinnitus.   Respiratory:   Negative for cough, sputum production and shortness of breath.   Cardiovascular:  Negative for chest pain, palpitations and leg swelling.  Gastrointestinal:  Negative for abdominal pain, constipation, diarrhea and heartburn.  Genitourinary:  Negative for dysuria, frequency and urgency.  Musculoskeletal:  Negative for back pain, falls, joint pain and myalgias.  Skin: Negative.   Neurological:  Negative for dizziness and headaches.  Psychiatric/Behavioral:  Negative for depression and memory loss. The patient does not have insomnia.     Past Medical History:  Diagnosis Date   Aphasia    Arthritis    OA- fingers, toes, back pain, hip pain    Breast cancer of upper-outer quadrant of left female breast (HCC)    left breast cancer   Complication of anesthesia    slow to wake up   Disorder of bone and cartilage, unspecified    Diverticulitis    Hypothyroid    Lumbago    Malignant neoplasm of breast (female), unspecified site    Normal nuclear stress test    [facility], 07/18/2011   Osteoarthritis (arthritis due to wear and tear of joints)    Pain in joint, pelvic region and thigh    Reflux esophagitis    pt. knowledgeable about dietary intake relative to reflux   S/P bilateral breast biopsy 2013   Shortness of breath    /w housework   Symptomatic menopausal or female climacteric states    Past Surgical History:  Procedure Laterality Date   APPENDECTOMY     BREAST BIOPSY  07/26/2011   Procedure: BREAST BIOPSY WITH NEEDLE LOCALIZATION;  Surgeon: Emelia Loron, MD;  Location: [facility] OR;  Service: General;  Laterality: Right;  Right breast wire guided biospy Needle localization @ [facility]  7:30   BREAST SURGERY     Procedure: BREAST BIOPSY WITH NEEDLE LOCALIZATION;  Sur   CATARACT EXTRACTION  2012   DILATION AND CURETTAGE OF UTERUS  1978   ECTOPIC PREGNANCY SURGERY     1980   EYE SURGERY     R cat. extracted /w IOL   LAPAROSCOPIC APPENDECTOMY N/A 03/03/2019   Procedure: APPENDECTOMY  LAPAROSCOPIC;  Surgeon: Darnell Level, MD;  Location: [facility]S;  Service: General;  Laterality: N/A;   OVARIAN CYST SURGERY     TONSILLECTOMY     as a child   Social History:   reports that she quit smoking about 42 years ago. Her smoking use included cigarettes. She smoked an average of .25 packs per day. She has never used smokeless tobacco. She reports that she does not drink alcohol and does not use drugs.  Family History  Problem Relation Age of Onset   Cancer Sister        colon   Cancer Brother        renal   Heart disease Brother    Cancer Mother        leukemia   Peripheral vascular disease Mother    Cancer Father        lung   Heart attack Brother    Cancer Maternal Aunt        breast   Cancer Cousin    Cancer Cousin    Anesthesia problems Neg Hx     Medications: Patient's Medications  New Prescriptions   No medications on file  Previous Medications   ACETAMINOPHEN (TYLENOL) 500 MG TABLET    Take 2 tablets (1,000 mg total) by mouth every 8 (eight) hours as needed for mild pain.   CA PHOSPHATE-CHOLECALCIFEROL (CALCIUM WITH D3) 250-12.5 MG-MCG CHEW    Chew 2 Chip by mouth daily.   CHOLECALCIFEROL (D3 5000) 125 MCG (5000 UT) CAPSULE    Take 5,000 Units by mouth daily.   CYANOCOBALAMIN (VITAMIN B-12) 1000 MCG SUBL    Place 1 tablet under the tongue daily. At breakfast   EZETIMIBE (ZETIA) 10 MG TABLET    TAKE 1 TABLET BY MOUTH EVERY DAY   LEVOTHYROXINE (SYNTHROID) 75 MCG TABLET    TAKE 1 TABLET BY MOUTH EVERY DAY BEFORE BREAKFAST   MAGNESIUM 250 MG TABS    Take by mouth daily at 6 (six) AM.   OVER THE COUNTER MEDICATION    Take 2 capsules by mouth daily. Serotonin Mood Support   VITAMIN E 400 UNIT CAPSULE    Take 800 Units by mouth daily.   Modified Medications   No medications on file  Discontinued Medications   MELOXICAM (MOBIC) 15 MG TABLET    Take 1 tablet (15 mg total) by mouth daily.    Physical Exam:  Vitals:   09/27/22 1058  BP: 114/70  Pulse: 71  Temp:  97.7 F (36.5 C)  TempSrc: Temporal  SpO2: 97%  Weight: 154 lb (69.9 kg)  Height: 5\' 3"  (1.6 m)   Body mass index is 27.28 kg/m. Wt Readings from Last 3 Encounters:  09/27/22 154 lb (69.9 kg)  06/27/22 151 lb (68.5 kg)  06/21/22 153 lb 12.8 oz (69.8 kg)  Physical Exam Constitutional:      General: She is not in acute distress.    Appearance: She is well-developed. She is not diaphoretic.  HENT:     Head: Normocephalic and atraumatic.     Mouth/Throat:     Pharynx: No oropharyngeal exudate.  Eyes:     Conjunctiva/sclera: Conjunctivae normal.     Pupils: Pupils are equal, round, and reactive to light.  Cardiovascular:     Rate and Rhythm: Normal rate and regular rhythm.     Heart sounds: Normal heart sounds.  Pulmonary:     Effort: Pulmonary effort is normal.     Breath sounds: Normal breath sounds.  Abdominal:     General: Bowel sounds are normal.     Palpations: Abdomen is soft.  Musculoskeletal:     Cervical back: Normal range of motion and neck supple.     Right lower leg: No edema.     Left lower leg: No edema.  Skin:    General: Skin is warm and dry.  Neurological:     Mental Status: She is alert.  Psychiatric:        Mood and Affect: Mood normal.     Labs reviewed: Basic Metabolic Panel: Recent Labs    03/20/22 0845  NA 143  K 5.0  CL 108  CO2 28  GLUCOSE 88  BUN 14  CREATININE 0.81  CALCIUM 9.2  TSH 5.78*   Liver Function Tests: Recent Labs    03/20/22 0845  AST 14  ALT 9  BILITOT 0.7  PROT 7.1   No results for input(s): "LIPASE", "AMYLASE" in the last 8760 hours. No results for input(s): "AMMONIA" in the last 8760 hours. CBC: Recent Labs    03/20/22 0845 06/12/22 1609  WBC 3.5* 5.0  NEUTROABS 1,659 2,695  HGB 14.3 13.7  HCT 41.9 40.6  MCV 92.9 93.8  PLT 236 252   Lipid Panel: Recent Labs    03/20/22 0845  CHOL 208*  HDL 71  LDLCALC 115*  TRIG 111  CHOLHDL 2.9   TSH: Recent Labs    03/20/22 0845  TSH 5.78*    A1C: No results found for: "HGBA1C"   Assessment/Plan 1. Acquired hypothyroidism Continues on synthroid 75 mcg, will follow up TSH - TSH  2. Pure hypercholesterolemia -continues on zetia with dietary modifications - Complete Metabolic Panel with eGFR - CBC with Differential/Platelet  3. Primary osteoarthritis involving multiple joints Stable, continues on tylenol PRN - CBC with Differential/Platelet  4. Yeast dermatitis - nystatin cream (MYCOSTATIN); Apply 1 Application topically 2 (two) times daily.  Dispense: 30 g; Refill: 0   Return in about 6 months (around 03/29/2023) for routine follow up .  Janene Harvey. Biagio Borg [facility] [phone]

## 2022-09-28 LAB — COMPLETE METABOLIC PANEL WITH GFR
AG Ratio: 1.6 (calc) (ref 1.0–2.5)
ALT: 13 U/L (ref 6–29)
AST: 15 U/L (ref 10–35)
Albumin: 4.4 g/dL (ref 3.6–5.1)
Alkaline phosphatase (APISO): 53 U/L (ref 37–153)
BUN: 9 mg/dL (ref 7–25)
CO2: 28 mmol/L (ref 20–32)
Calcium: 9.4 mg/dL (ref 8.6–10.4)
Chloride: 106 mmol/L (ref 98–110)
Creat: 0.82 mg/dL (ref 0.60–1.00)
Globulin: 2.7 g/dL (calc) (ref 1.9–3.7)
Glucose, Bld: 64 mg/dL — ABNORMAL LOW (ref 65–139)
Potassium: 5 mmol/L (ref 3.5–5.3)
Sodium: 143 mmol/L (ref 135–146)
Total Bilirubin: 0.6 mg/dL (ref 0.2–1.2)
Total Protein: 7.1 g/dL (ref 6.1–8.1)
eGFR: 73 mL/min/{1.73_m2} (ref >=60)

## 2022-09-28 LAB — CBC WITH DIFFERENTIAL/PLATELET
Absolute Monocytes: 316 cells/uL (ref 200–950)
Basophils Absolute: 31 cells/uL (ref 0–200)
Basophils Relative: 0.8 %
Eosinophils Absolute: 59 cells/uL (ref 15–500)
Eosinophils Relative: 1.5 %
HCT: 41.5 % (ref 35.0–45.0)
Hemoglobin: 13.9 g/dL (ref 11.7–15.5)
Lymphs Abs: 1478 cells/uL (ref 850–3900)
MCH: 31 pg (ref 27.0–33.0)
MCHC: 33.5 g/dL (ref 32.0–36.0)
MCV: 92.6 fL (ref 80.0–100.0)
MPV: 9.6 fL (ref 7.5–12.5)
Monocytes Relative: 8.1 %
Neutro Abs: 2016 cells/uL (ref 1500–7800)
Neutrophils Relative %: 51.7 %
Platelets: 233 10*3/uL (ref 140–400)
RBC: 4.48 10*6/uL (ref 3.80–5.10)
RDW: 12.8 % (ref 11.0–15.0)
Total Lymphocyte: 37.9 %
WBC: 3.9 10*3/uL (ref 3.8–10.8)

## 2022-09-28 LAB — TSH: TSH: 2.56 mIU/L (ref 0.40–4.50)

## 2023-01-08 ENCOUNTER — Other Ambulatory Visit: Payer: Self-pay | Admitting: Nurse Practitioner

## 2023-01-08 DIAGNOSIS — E7800 Pure hypercholesterolemia, unspecified: Secondary | ICD-10-CM

## 2023-01-29 DIAGNOSIS — K648 Other hemorrhoids: Secondary | ICD-10-CM | POA: Diagnosis not present

## 2023-01-29 DIAGNOSIS — Z860100 Personal history of colon polyps, unspecified: Secondary | ICD-10-CM | POA: Diagnosis not present

## 2023-01-29 DIAGNOSIS — Z09 Encounter for follow-up examination after completed treatment for conditions other than malignant neoplasm: Secondary | ICD-10-CM | POA: Diagnosis not present

## 2023-01-29 DIAGNOSIS — D125 Benign neoplasm of sigmoid colon: Secondary | ICD-10-CM | POA: Diagnosis not present

## 2023-01-29 DIAGNOSIS — D122 Benign neoplasm of ascending colon: Secondary | ICD-10-CM | POA: Diagnosis not present

## 2023-01-29 DIAGNOSIS — D120 Benign neoplasm of cecum: Secondary | ICD-10-CM | POA: Diagnosis not present

## 2023-01-29 DIAGNOSIS — K5730 Diverticulosis of large intestine without perforation or abscess without bleeding: Secondary | ICD-10-CM | POA: Diagnosis not present

## 2023-01-31 DIAGNOSIS — D122 Benign neoplasm of ascending colon: Secondary | ICD-10-CM | POA: Diagnosis not present

## 2023-01-31 DIAGNOSIS — D125 Benign neoplasm of sigmoid colon: Secondary | ICD-10-CM | POA: Diagnosis not present

## 2023-01-31 DIAGNOSIS — D120 Benign neoplasm of cecum: Secondary | ICD-10-CM | POA: Diagnosis not present

## 2023-03-18 ENCOUNTER — Other Ambulatory Visit: Payer: Self-pay | Admitting: Nurse Practitioner

## 2023-03-18 DIAGNOSIS — E039 Hypothyroidism, unspecified: Secondary | ICD-10-CM

## 2023-04-04 ENCOUNTER — Encounter: Payer: Self-pay | Admitting: Nurse Practitioner

## 2023-04-04 ENCOUNTER — Ambulatory Visit (INDEPENDENT_AMBULATORY_CARE_PROVIDER_SITE_OTHER): Payer: Medicare Other | Admitting: Nurse Practitioner

## 2023-04-04 ENCOUNTER — Ambulatory Visit: Payer: Medicare Other | Admitting: Family

## 2023-04-04 VITALS — BP 110/70 | HR 66 | Temp 97.9°F | Ht 63.0 in | Wt 158.0 lb

## 2023-04-04 DIAGNOSIS — D1801 Hemangioma of skin and subcutaneous tissue: Secondary | ICD-10-CM | POA: Diagnosis not present

## 2023-04-04 DIAGNOSIS — M810 Age-related osteoporosis without current pathological fracture: Secondary | ICD-10-CM

## 2023-04-04 DIAGNOSIS — M150 Primary generalized (osteo)arthritis: Secondary | ICD-10-CM

## 2023-04-04 DIAGNOSIS — L720 Epidermal cyst: Secondary | ICD-10-CM | POA: Diagnosis not present

## 2023-04-04 DIAGNOSIS — E039 Hypothyroidism, unspecified: Secondary | ICD-10-CM | POA: Diagnosis not present

## 2023-04-04 DIAGNOSIS — E7800 Pure hypercholesterolemia, unspecified: Secondary | ICD-10-CM | POA: Diagnosis not present

## 2023-04-04 DIAGNOSIS — L738 Other specified follicular disorders: Secondary | ICD-10-CM | POA: Diagnosis not present

## 2023-04-04 DIAGNOSIS — H9193 Unspecified hearing loss, bilateral: Secondary | ICD-10-CM | POA: Diagnosis not present

## 2023-04-04 DIAGNOSIS — L821 Other seborrheic keratosis: Secondary | ICD-10-CM | POA: Diagnosis not present

## 2023-04-04 DIAGNOSIS — Z23 Encounter for immunization: Secondary | ICD-10-CM | POA: Diagnosis not present

## 2023-04-04 DIAGNOSIS — L814 Other melanin hyperpigmentation: Secondary | ICD-10-CM | POA: Diagnosis not present

## 2023-04-04 NOTE — Progress Notes (Signed)
Careteam: Patient Care Team: Sharon Seller, NP as PCP - General (Geriatric Medicine) Glyn Ade, PA-C as Physician Assistant (Dermatology) Magrinat, Valentino Hue, MD (Inactive) as Consulting Physician (Oncology) Charlott Rakes, MD as Consulting Physician (Gastroenterology) Marlene Bast (Optometry)  PLACE OF SERVICE:  [facility]  Advanced Directive information Does Patient Have a Medical Advance Directive?: Yes, Type of Advance Directive: Healthcare Power of Attorney, Does patient want to make changes to medical advance directive?: No - Patient declined  Allergies  Allergen Reactions   Benadryl [Diphenhydramine Hcl] Other (See Comments)    "Super hyper"   Tape     Skin breakdown/redness    Boniva [Ibandronate Sodium] Other (See Comments)    Neck, shoulder limited mobility    Peridin-C [Ascorbic Acid] Diarrhea   Venlafaxine Nausea Only   Chlorhexidine Gluconate Itching   Codeine Other (See Comments)    "can't wake up from it."   Sulfa Antibiotics Rash    Patient unsure of reaction    Chief Complaint  Patient presents with   Medical Management of Chronic Issues    6 month follow-up. Discuss weight and decreased hearing. Flu vaccine today.      HPI: Patient is a 77 y.o. female for routine follow up.   Had body scan at the dermatologist today and was good- no abnormal findings.   Had colonoscopy in Oct- plan to repeat in 3 years.   Weight is going up- she is walking 2-3 miles a few times a week.  Not eating more- has limited her food intake.  Reports she is fatigued after her walks.  She is out and being active.   Osteoporosis- she took medication in the boniva in the past and could not get out of the bed, very hesitant to try anything medication    Family seems to think she needs to get her hearing check.  She has a hard time hearing ppl when there is a lot of people talking  Has a hard time understanding the TV.   Review of Systems:  Review  of Systems  Constitutional:  Negative for chills, fever and weight loss.  HENT:  Positive for hearing loss and tinnitus.   Respiratory:  Negative for cough, sputum production and shortness of breath.   Cardiovascular:  Negative for chest pain, palpitations and leg swelling.  Gastrointestinal:  Negative for abdominal pain, constipation, diarrhea and heartburn.  Genitourinary:  Negative for dysuria, frequency and urgency.  Musculoskeletal:  Positive for joint pain. Negative for back pain, falls and myalgias.  Skin: Negative.   Neurological:  Negative for dizziness and headaches.  Psychiatric/Behavioral:  Negative for depression and memory loss. The patient does not have insomnia.     Past Medical History:  Diagnosis Date   Aphasia    Arthritis    OA- fingers, toes, back pain, hip pain    Breast cancer of upper-outer quadrant of left female breast (HCC)    left breast cancer   Complication of anesthesia    slow to wake up   Disorder of bone and cartilage, unspecified    Diverticulitis    Hypothyroid    Lumbago    Malignant neoplasm of breast (female), unspecified site    Normal nuclear stress test    [facility], 07/18/2011   Osteoarthritis (arthritis due to wear and tear of joints)    Pain in joint, pelvic region and thigh    Reflux esophagitis    pt. knowledgeable about dietary intake relative to  reflux   S/P bilateral breast biopsy 2013   Shortness of breath    /w housework   Symptomatic menopausal or female climacteric states    Past Surgical History:  Procedure Laterality Date   APPENDECTOMY     BREAST BIOPSY  07/26/2011   Procedure: BREAST BIOPSY WITH NEEDLE LOCALIZATION;  Surgeon: Emelia Loron, MD;  Location: [facility] OR;  Service: General;  Laterality: Right;  Right breast wire guided biospy Needle localization @ [facility]  7:30   BREAST SURGERY     Procedure: BREAST BIOPSY WITH NEEDLE LOCALIZATION;  Sur   CATARACT EXTRACTION  2012   DILATION AND CURETTAGE OF UTERUS  1978    ECTOPIC PREGNANCY SURGERY     1980   EYE SURGERY     R cat. extracted /w IOL   LAPAROSCOPIC APPENDECTOMY N/A 03/03/2019   Procedure: APPENDECTOMY LAPAROSCOPIC;  Surgeon: Darnell Level, MD;  Location: [facility];  Service: General;  Laterality: N/A;   OVARIAN CYST SURGERY     TONSILLECTOMY     as a child   Social History:   reports that she quit smoking about 42 years ago. Her smoking use included cigarettes. She has never used smokeless tobacco. She reports that she does not drink alcohol and does not use drugs.  Family History  Problem Relation Age of Onset   Cancer Sister        colon   Cancer Brother        renal   Heart disease Brother    Cancer Mother        leukemia   Peripheral vascular disease Mother    Cancer Father        lung   Heart attack Brother    Cancer Maternal Aunt        breast   Cancer Cousin    Cancer Cousin    Anesthesia problems Neg Hx     Medications: Patient's Medications  New Prescriptions   No medications on file  Previous Medications   ACETAMINOPHEN (TYLENOL) 500 MG TABLET    Take 2 tablets (1,000 mg total) by mouth every 8 (eight) hours as needed for mild pain.   CA PHOSPHATE-CHOLECALCIFEROL (CALCIUM WITH D3) 250-12.5 MG-MCG CHEW    Chew 1 Chip by mouth daily.   CHOLECALCIFEROL (D3 5000) 125 MCG (5000 UT) CAPSULE    Take 5,000 Units by mouth daily.   CYANOCOBALAMIN (VITAMIN B-12) 1000 MCG SUBL    Place 1 tablet under the tongue daily. At breakfast   DIPHENHYDRAMINE-ACETAMINOPHEN (TYLENOL PM) 25-500 MG TABS TABLET    Take 1 tablet by mouth at bedtime as needed.   EZETIMIBE (ZETIA) 10 MG TABLET    TAKE 1 TABLET BY MOUTH EVERY DAY   MAGNESIUM 250 MG TABS    Take by mouth daily at 6 (six) AM.   NYSTATIN CREAM (MYCOSTATIN)    Apply 1 Application topically 2 (two) times daily.   OVER THE COUNTER MEDICATION    Take 1 capsule by mouth daily. Serotonin Mood Support   SYNTHROID 75 MCG TABLET    TAKE 1 TABLET BY MOUTH EVERY DAY BEFORE BREAKFAST   VITAMIN  E 400 UNIT CAPSULE    Take 800 Units by mouth daily.   Modified Medications   No medications on file  Discontinued Medications   No medications on file    Physical Exam:  Vitals:   04/04/23 1427  BP: 110/70  Pulse: 66  Temp: 97.9 F (36.6 C)  TempSrc: Temporal  SpO2: 97%  Weight: 158 lb (71.7 kg)  Height: 5\' 3"  (1.6 m)   Body mass index is 27.99 kg/m. Wt Readings from Last 3 Encounters:  04/04/23 158 lb (71.7 kg)  09/27/22 154 lb (69.9 kg)  06/27/22 151 lb (68.5 kg)    Physical Exam Constitutional:      General: She is not in acute distress.    Appearance: She is well-developed. She is not diaphoretic.  HENT:     Head: Normocephalic and atraumatic.     Mouth/Throat:     Pharynx: No oropharyngeal exudate.  Eyes:     Conjunctiva/sclera: Conjunctivae normal.     Pupils: Pupils are equal, round, and reactive to light.  Cardiovascular:     Rate and Rhythm: Normal rate and regular rhythm.     Heart sounds: Normal heart sounds.  Pulmonary:     Effort: Pulmonary effort is normal.     Breath sounds: Normal breath sounds.  Abdominal:     General: Bowel sounds are normal.     Palpations: Abdomen is soft.  Musculoskeletal:     Cervical back: Normal range of motion and neck supple.     Right lower leg: No edema.     Left lower leg: No edema.  Skin:    General: Skin is warm and dry.  Neurological:     Mental Status: She is alert.  Psychiatric:        Mood and Affect: Mood normal.     Labs reviewed: Basic Metabolic Panel: Recent Labs    09/27/22 1125  NA 143  K 5.0  CL 106  CO2 28  GLUCOSE 64*  BUN 9  CREATININE 0.82  CALCIUM 9.4  TSH 2.56   Liver Function Tests: Recent Labs    09/27/22 1125  AST 15  ALT 13  BILITOT 0.6  PROT 7.1   No results for input(s): "LIPASE", "AMYLASE" in the last 8760 hours. No results for input(s): "AMMONIA" in the last 8760 hours. CBC: Recent Labs    06/12/22 1609 09/27/22 1125  WBC 5.0 3.9  NEUTROABS 2,695 2,016   HGB 13.7 13.9  HCT 40.6 41.5  MCV 93.8 92.6  PLT 252 233   Lipid Panel: No results for input(s): "CHOL", "HDL", "LDLCALC", "TRIG", "CHOLHDL", "LDLDIRECT" in the last 8760 hours. TSH: Recent Labs    09/27/22 1125  TSH 2.56   A1C: No results found for: "HGBA1C"   Assessment/Plan 1. Acquired hypothyroidism -continues on synthroid, will follow up labs - TSH - T3, Free - T4, Free  2. Pure hypercholesterolemia -continues on zetia and dietary modifications.  - Lipid panel - COMPLETE METABOLIC PANEL WITH GFR - CBC with Differential/Platelet  3. Primary osteoarthritis involving multiple joints Stable at this time, continue lifestyle modifications - CBC with Differential/Platelet  4. Osteoporosis, unspecified osteoporosis type, unspecified pathological fracture presence -Recommended to take calcium 600 mg twice daily with Vitamin D 2000 units daily and weight bearing activity 30 mins/5 days a week Does not wish to be on medication at this time due to side effects from boniva in the past  5. Bilateral hearing loss, unspecified hearing loss type - Ambulatory referral to Audiology  6. Need for influenza vaccination - Flu Vaccine Trivalent High Dose (Fluad)    Return in about 6 months (around 10/03/2023) for routine follow up.  Janene Harvey. Biagio Borg [facility] Adult [facility] [phone]

## 2023-04-05 LAB — COMPLETE METABOLIC PANEL WITH GFR
AG Ratio: 1.7 (calc) (ref 1.0–2.5)
ALT: 12 U/L (ref 6–29)
AST: 15 U/L (ref 10–35)
Albumin: 4.3 g/dL (ref 3.6–5.1)
Alkaline phosphatase (APISO): 55 U/L (ref 37–153)
BUN: 12 mg/dL (ref 7–25)
CO2: 27 mmol/L (ref 20–32)
Calcium: 8.9 mg/dL (ref 8.6–10.4)
Chloride: 105 mmol/L (ref 98–110)
Creat: 0.72 mg/dL (ref 0.60–1.00)
Globulin: 2.6 g/dL (ref 1.9–3.7)
Glucose, Bld: 90 mg/dL (ref 65–139)
Potassium: 4.6 mmol/L (ref 3.5–5.3)
Sodium: 139 mmol/L (ref 135–146)
Total Bilirubin: 0.4 mg/dL (ref 0.2–1.2)
Total Protein: 6.9 g/dL (ref 6.1–8.1)
eGFR: 86 mL/min/{1.73_m2} (ref >=60)

## 2023-04-05 LAB — LIPID PANEL
Cholesterol: 214 mg/dL — ABNORMAL HIGH (ref <200)
HDL: 63 mg/dL (ref >=50)
LDL Cholesterol (Calc): 120 mg/dL — ABNORMAL HIGH
Non-HDL Cholesterol (Calc): 151 mg/dL — ABNORMAL HIGH (ref <130)
Total CHOL/HDL Ratio: 3.4 (calc) (ref <5.0)
Triglycerides: 184 mg/dL — ABNORMAL HIGH (ref <150)

## 2023-04-05 LAB — CBC WITH DIFFERENTIAL/PLATELET
Absolute Lymphocytes: 1742 {cells}/uL (ref 850–3900)
Absolute Monocytes: 322 {cells}/uL (ref 200–950)
Basophils Absolute: 38 {cells}/uL (ref 0–200)
Basophils Relative: 0.8 %
Eosinophils Absolute: 48 {cells}/uL (ref 15–500)
Eosinophils Relative: 1 %
HCT: 41.1 % (ref 35.0–45.0)
Hemoglobin: 13.8 g/dL (ref 11.7–15.5)
MCH: 31 pg (ref 27.0–33.0)
MCHC: 33.6 g/dL (ref 32.0–36.0)
MCV: 92.4 fL (ref 80.0–100.0)
MPV: 9.7 fL (ref 7.5–12.5)
Monocytes Relative: 6.7 %
Neutro Abs: 2650 {cells}/uL (ref 1500–7800)
Neutrophils Relative %: 55.2 %
Platelets: 239 10*3/uL (ref 140–400)
RBC: 4.45 10*6/uL (ref 3.80–5.10)
RDW: 12.6 % (ref 11.0–15.0)
Total Lymphocyte: 36.3 %
WBC: 4.8 10*3/uL (ref 3.8–10.8)

## 2023-04-05 LAB — T4, FREE: Free T4: 1 ng/dL (ref 0.8–1.8)

## 2023-04-05 LAB — T3, FREE: T3, Free: 2.8 pg/mL (ref 2.3–4.2)

## 2023-04-05 LAB — TSH: TSH: 3.21 m[IU]/L (ref 0.40–4.50)

## 2023-04-07 ENCOUNTER — Telehealth: Payer: Self-pay | Admitting: Nurse Practitioner

## 2023-04-07 NOTE — Telephone Encounter (Signed)
error 

## 2023-04-17 DIAGNOSIS — Z1231 Encounter for screening mammogram for malignant neoplasm of breast: Secondary | ICD-10-CM | POA: Diagnosis not present

## 2023-05-11 ENCOUNTER — Other Ambulatory Visit: Payer: Self-pay | Admitting: Nurse Practitioner

## 2023-05-11 DIAGNOSIS — E7800 Pure hypercholesterolemia, unspecified: Secondary | ICD-10-CM

## 2023-05-16 DIAGNOSIS — H903 Sensorineural hearing loss, bilateral: Secondary | ICD-10-CM | POA: Diagnosis not present

## 2023-05-16 DIAGNOSIS — H9313 Tinnitus, bilateral: Secondary | ICD-10-CM | POA: Diagnosis not present

## 2023-06-02 ENCOUNTER — Ambulatory Visit (INDEPENDENT_AMBULATORY_CARE_PROVIDER_SITE_OTHER): Payer: Medicare Other | Admitting: Nurse Practitioner

## 2023-06-02 VITALS — BP 142/74 | HR 67 | Temp 96.9°F | Ht 63.0 in | Wt 159.0 lb

## 2023-06-02 DIAGNOSIS — Z0000 Encounter for general adult medical examination without abnormal findings: Secondary | ICD-10-CM | POA: Diagnosis not present

## 2023-06-02 NOTE — Progress Notes (Signed)
Subjective:   Sylvia [name] is a 78 y.o. female who presents for Medicare Annual (Subsequent) preventive examination.  Visit Complete: In person    Cardiac Risk Factors include: family history of premature cardiovascular disease;advanced age (>86men, >12 women);sedentary lifestyle;dyslipidemia     Objective:    Today's Vitals   06/02/23 1008 06/02/23 1010 06/02/23 1034  BP: (!) 140/78 (!) 142/74   Pulse: 67    Temp: (!) 96.9 F (36.1 C)    TempSrc: Temporal    SpO2: 98%    Weight: 159 lb (72.1 kg)    Height: 5\' 3"  (1.6 m)    PainSc:   2    Body mass index is 28.17 kg/m.     06/02/2023   10:07 AM 05/30/2023    4:52 PM 04/04/2023    2:27 PM 09/27/2022   10:58 AM 09/26/2022    4:40 PM 05/27/2022    8:42 AM 01/24/2022    1:02 PM  Advanced Directives  Does Patient Have a Medical Advance Directive? Yes Yes Yes Yes Yes Yes Yes  Type of Estate agent of [place];Living will Healthcare Power of [place];Living will Healthcare Power of [employer] of [place];Living will Healthcare Power of [place];Living will Healthcare Power of [place];Living will Healthcare Power of [place];Living will  Does patient want to make changes to medical advance directive? No - Patient declined No - Patient declined No - Patient declined  No - Patient declined No - Patient declined   Copy of Healthcare Power of Attorney in Chart? Yes - validated most recent copy scanned in chart (See row information) Yes - validated most recent copy scanned in chart (See row information) Yes - validated most recent copy scanned in chart (See row information) Yes - validated most recent copy scanned in chart (See row information) Yes - validated most recent copy scanned in chart (See row information) Yes - validated most recent copy scanned in chart (See row information) Yes - validated most recent copy scanned in chart (See row information)    Current Medications  (verified) Outpatient Encounter Medications as of 06/02/2023  Medication Sig   acetaminophen (TYLENOL) 500 MG tablet Take 2 tablets (1,000 mg total) by mouth every 8 (eight) hours as needed for mild pain.   Ca Phosphate-Cholecalciferol (CALCIUM WITH D3) 250-12.5 MG-MCG CHEW Chew 1 Chip by mouth daily.   Cholecalciferol (D3 5000) 125 MCG (5000 UT) capsule Take 5,000 Units by mouth daily.   Cyanocobalamin (VITAMIN B-12) 1000 MCG SUBL Place 1 tablet under the tongue daily. At breakfast   diphenhydramine-acetaminophen (TYLENOL PM) 25-500 MG TABS tablet Take 1 tablet by mouth at bedtime as needed.   ezetimibe (ZETIA) 10 MG tablet TAKE 1 TABLET BY MOUTH EVERY DAY   Magnesium 250 MG TABS Take by mouth daily at 6 (six) AM.   OVER THE COUNTER MEDICATION Take 1 capsule by mouth daily. Serotonin Mood Support   SYNTHROID 75 MCG tablet TAKE 1 TABLET BY MOUTH EVERY DAY BEFORE BREAKFAST   vitamin E 400 UNIT capsule Take 800 Units by mouth daily.    nystatin cream (MYCOSTATIN) Apply 1 Application topically 2 (two) times daily. (Patient not taking: Reported on 06/02/2023)   No facility-administered encounter medications on file as of 06/02/2023.    Allergies (verified) Benadryl [diphenhydramine hcl], Tape, Boniva [ibandronate sodium], Peridin-c [ascorbic acid], Venlafaxine, Chlorhexidine gluconate, Codeine, and Sulfa antibiotics   History: Past Medical History:  Diagnosis Date   Aphasia    Arthritis    OA-  fingers, toes, back pain, hip pain    Breast cancer of upper-outer quadrant of left female breast (HCC)    left breast cancer   Complication of anesthesia    slow to wake up   Disorder of bone and cartilage, unspecified    Diverticulitis    Hypothyroid    Lumbago    Malignant neoplasm of breast (female), unspecified site    Normal nuclear stress test    [facility], 07/18/2011   Osteoarthritis (arthritis due to wear and tear of joints)    Pain in joint, pelvic region and thigh    Reflux esophagitis     pt. knowledgeable about dietary intake relative to reflux   S/P bilateral breast biopsy 2013   Shortness of breath    /w housework   Symptomatic menopausal or female climacteric states    Past Surgical History:  Procedure Laterality Date   APPENDECTOMY     BREAST BIOPSY  07/26/2011   Procedure: BREAST BIOPSY WITH NEEDLE LOCALIZATION;  Surgeon: Emelia Loron, MD;  Location: [facility] OR;  Service: General;  Laterality: Right;  Right breast wire guided biospy Needle localization @ [facility]  7:30   BREAST SURGERY     Procedure: BREAST BIOPSY WITH NEEDLE LOCALIZATION;  Sur   CATARACT EXTRACTION  2012   DILATION AND CURETTAGE OF UTERUS  1978   ECTOPIC PREGNANCY SURGERY     1980   EYE SURGERY     R cat. extracted /w IOL   LAPAROSCOPIC APPENDECTOMY N/A 03/03/2019   Procedure: APPENDECTOMY LAPAROSCOPIC;  Surgeon: Darnell Level, MD;  Location: [facility];  Service: General;  Laterality: N/A;   OVARIAN CYST SURGERY     TONSILLECTOMY     as a child   Family History  Problem Relation Age of Onset   Cancer Sister        colon   Cancer Brother        renal   Heart disease Brother    Cancer Mother        leukemia   Peripheral vascular disease Mother    Cancer Father        lung   Heart attack Brother    Cancer Maternal Aunt        breast   Cancer Cousin    Cancer Cousin    Anesthesia problems Neg Hx    Social History   Socioeconomic History   Marital status: Widowed    Spouse name: Not on file   Number of children: Not on file   Years of education: Not on file   Highest education level: 12th grade  Occupational History   Not on file  Tobacco Use   Smoking status: Former    Current packs/day: 0.00    Types: Cigarettes    Quit date: 07/22/1980    Years since quitting: 42.8   Smokeless tobacco: Never  Vaping Use   Vaping status: Never Used  Substance and Sexual Activity   Alcohol use: No   Drug use: No   Sexual activity: Not Currently    Birth control/protection: None  Other  Topics Concern   Not on file  Social History Narrative   Not on file   Social Drivers of Health   Financial Resource Strain: Low Risk  (03/31/2023)   Overall Financial Resource Strain (CARDIA)    Difficulty of Paying Living Expenses: Not hard at all  Food Insecurity: No Food Insecurity (03/31/2023)   Hunger Vital Sign    Worried About Running Out  of Food in the Last Year: Never true    Ran Out of Food in the Last Year: Never true  Transportation Needs: No Transportation Needs (03/31/2023)   PRAPARE - Administrator, Civil Service (Medical): No    Lack of Transportation (Non-Medical): No  Physical Activity: Insufficiently Active (03/31/2023)   Exercise Vital Sign    Days of Exercise per Week: 2 days    Minutes of Exercise per Session: 60 min  Stress: Stress Concern Present (03/31/2023)   [employer] of Occupational Health - Occupational Stress Questionnaire    Feeling of Stress : To some extent  Social Connections: Moderately Isolated (03/31/2023)   Social Connection and Isolation Panel [NHANES]    Frequency of Communication with Friends and Family: More than three times a week    Frequency of Social Gatherings with Friends and Family: Twice a week    Attends Religious Services: Never    Database administrator or Organizations: Yes    Attends Banker Meetings: Patient declined    Marital Status: Widowed    Tobacco Counseling Counseling given: Not Answered   Clinical Intake:  Pre-visit preparation completed: Yes  Pain : 0-10 Pain Score: 2  Pain Type: Acute pain Pain Location: Groin Pain Orientation: Left Pain Onset: 1 to 4 weeks ago Pain Frequency: Occasional     BMI - recorded: 28 Nutritional Status: BMI 25 -29 Overweight Nutritional Risks: None Diabetes: No  How often do you need to have someone help you when you read instructions, pamphlets, or other written materials from your doctor or pharmacy?: 1 - Never         Activities  of Daily Living    06/02/2023   10:32 AM  In your present state of health, do you have any difficulty performing the following activities:  Hearing? 1  Vision? 0  Difficulty concentrating or making decisions? 1  Walking or climbing stairs? 0  Dressing or bathing? 0  Doing errands, shopping? 0  Preparing Food and eating ? N  Using the Toilet? N  In the past six months, have you accidently leaked urine? N  Do you have problems with loss of bowel control? N  Managing your Medications? N  Managing your Finances? N  Housekeeping or managing your Housekeeping? N    Patient Care Team: Sharon Seller, NP as PCP - General (Geriatric Medicine) Suzi Roots as Physician Assistant (Dermatology) Magrinat, Valentino Hue, MD (Inactive) as Consulting Physician (Oncology) Charlott Rakes, MD as Consulting Physician (Gastroenterology) Marlene Bast [facility])  Indicate any recent Medical Services you may have received from other than [facility] providers in the past year (date may be approximate).     Assessment:   This is a routine wellness examination for [facility].  Hearing/Vision screen Vision Screening   Right eye Left eye Both eyes  Without correction 20/50 20/25 20/25   With correction     Comments: Last eye exam greater than 12 months ago   Hearing Screening - Comments:: Patient with hearing loss, had hearing exam with audiologist recently   Goals Addressed   None    Depression Screen    06/02/2023   10:05 AM 04/04/2023    2:26 PM 09/27/2022   10:57 AM 05/27/2022   10:50 AM 07/20/2021   11:03 AM 05/24/2021   10:40 AM 04/13/2021   10:31 AM  PHQ 2/9 Scores  PHQ - 2 Score 0 0 0 0 0 0   Exception Documentation  Other- indicate reason in comment box  Not completed       seeing grief counslor through hospice    Fall Risk    06/02/2023   10:04 AM 04/04/2023    2:26 PM 09/27/2022   10:57 AM 05/27/2022   10:50 AM 01/24/2022    1:02 PM  Fall Risk   Falls in the past  year? 0 0 0 0 0  Number falls in past yr: 0 0 0 0 0  Injury with Fall? 0 0 0 0 0  Risk for fall due to : No Fall Risks No Fall Risks No Fall Risks No Fall Risks No Fall Risks  Follow up Falls evaluation completed Falls evaluation completed Falls evaluation completed Falls evaluation completed Falls evaluation completed    MEDICARE RISK AT HOME: Medicare Risk at Home Any stairs in or around the home?: Yes If so, are there any without handrails?: No Home free of loose throw rugs in walkways, pet beds, electrical cords, etc?: Yes Adequate lighting in your home to reduce risk of falls?: Yes Life alert?: No Use of a cane, walker or w/c?: No Grab bars in the bathroom?: Yes Shower chair or bench in shower?: Yes Elevated toilet seat or a handicapped toilet?: No  TIMED UP AND GO:  Was the test performed?  No    Cognitive Function:    06/02/2023   10:11 AM 05/27/2022   10:57 AM 05/08/2020    1:00 PM 04/14/2018   10:06 AM 04/11/2017   11:39 AM  MMSE - Mini Mental State Exam  Orientation to time 5 5 5 5 4   Orientation to Place 4 4 5 5 5   Registration 3 3 3 3 3   Attention/ Calculation 5 5 2 5 5   Recall 3 3 3 3 3   Language- name 2 objects 2 2 2 2 2   Language- repeat 1 1 1 1 1   Language- follow 3 step command 3 3 3 3 3   Language- read & follow direction 1 1 1 1 1   Write a sentence 1 1 1 1 1   Copy design 1 1 1 1 1   Total score 29 29 27 30 29         05/24/2021   10:44 AM 04/20/2019    9:28 AM  6CIT Screen  What Year? 0 points 0 points  What month? 0 points 0 points  What time? 0 points 0 points  Count back from 20 0 points 0 points  Months in reverse 0 points 0 points  Repeat phrase 10 points 0 points  Total Score 10 points 0 points    Immunizations Immunization History  Administered Date(s) Administered   Fluad Quad(high Dose 65+) 02/02/2019, 02/13/2021, 03/20/2022   Fluad Trivalent(High Dose 65+) 04/04/2023   Influenza Whole 03/18/2014   Influenza, High Dose Seasonal PF  02/10/2017, 02/09/2018, 02/07/2020   Influenza, Seasonal, Injecte, Preservative Fre 02/01/2016   Influenza,inj,quad, With Preservative 08/27/2020   Influenza-Unspecified 02/05/2010, 03/30/2012, 04/02/2013, 02/21/2015, 02/07/2020   PFIZER(Purple Top)SARS-COV-2 Vaccination 05/18/2019, 06/07/2019, 02/10/2020   Pneumococcal Conjugate-13 04/08/2014   Pneumococcal Polysaccharide-23 09/28/2012   Td 04/29/1978   Tdap 04/15/2013   Zoster Recombinant(Shingrix) 06/25/2021, 09/10/2021   Zoster, Live 01/24/2014    TDAP status: Due, Education has been provided regarding the importance of this vaccine. Advised may receive this vaccine at local pharmacy or Health Dept. Aware to provide a copy of the vaccination record if obtained from local pharmacy or Health Dept. Verbalized acceptance and understanding.  Flu Vaccine status: Up  to date  Pneumococcal vaccine status: Up to date  Covid-19 vaccine status: Information provided on how to obtain vaccines.   Qualifies for Shingles Vaccine? Yes   Zostavax completed No   Shingrix Completed?: Yes  Screening Tests Health Maintenance  Topic Date Due   DTaP/Tdap/Td (3 - Td or Tdap) 04/16/2023   MAMMOGRAM  04/16/2023   Medicare Annual Wellness (AWV)  06/01/2024   DEXA SCAN  07/18/2024   Colonoscopy  01/28/2026   Pneumonia Vaccine 36+ Years old  Completed   INFLUENZA VACCINE  Completed   Hepatitis C Screening  Completed   Zoster Vaccines- Shingrix  Completed   HPV VACCINES  Aged Out   COVID-19 Vaccine  Discontinued    Health Maintenance  Health Maintenance Due  Topic Date Due   DTaP/Tdap/Td (3 - Td or Tdap) 04/16/2023   MAMMOGRAM  04/16/2023    Colorectal cancer screening: Type of screening: Colonoscopy. Completed 2024. Repeat every 3 years  Mammogram status: No longer required due to age.  Bone Density status: Completed 07/19/2022. Results reflect: Bone density results: OSTEOPOROSIS. Repeat every 2 years.  Lung Cancer Screening: (Low Dose CT  Chest recommended if Age 36-80 years, 20 pack-year currently smoking OR have quit w/in 15years.) does not qualify.   Lung Cancer Screening Referral: na  Additional Screening:  Hepatitis C Screening: does qualify; Completed   Vision Screening: Recommended annual ophthalmology exams for early detection of glaucoma and other disorders of the eye. Is the patient up to date with their annual eye exam?  No  Who is the provider or what is the name of the office in which the patient attends annual eye exams? [facility] If pt is not established with a provider, would they like to be referred to a provider to establish care? No .   Dental Screening: Recommended annual dental exams for proper oral hygiene  Community Resource Referral / Chronic Care Management: CRR required this visit?  No   CCM required this visit?  /  ==   Plan:     I have personally reviewed and noted the following in the patient's chart:   Medical and social history Use of alcohol, tobacco or illicit drugs  Current medications and supplements including opioid prescriptions. Patient is not currently taking opioid prescriptions. Functional ability and status Nutritional status Physical activity Advanced directives List of other physicians Hospitalizations, surgeries, and ER visits in previous 12 months Vitals Screenings to include cognitive, depression, and falls Referrals and appointments  In addition, I have reviewed and discussed with patient certain preventive protocols, quality metrics, and best practice recommendations. A written personalized care plan for preventive services as well as general preventive health recommendations were provided to patient.     Sharon Seller, NP   06/02/2023

## 2023-06-02 NOTE — Patient Instructions (Signed)
  Sylvia [name] , Thank you for taking time to come for your Medicare Wellness Visit. I appreciate your ongoing commitment to your health goals. Please review the following plan we discussed and let me know if I can assist you in the future.   To make ophthalmology appt since you are overdue To go to pharmacy to get TDAP   This is a list of the screening recommended for you and due dates:  Health Maintenance  Topic Date Due   DTaP/Tdap/Td vaccine (3 - Td or Tdap) 04/16/2023   Mammogram  04/16/2023   Medicare Annual Wellness Visit  06/01/2024   DEXA scan (bone density measurement)  07/18/2024   Colon Cancer Screening  01/28/2026   Pneumonia Vaccine  Completed   Flu Shot  Completed   Hepatitis C Screening  Completed   Zoster (Shingles) Vaccine  Completed   HPV Vaccine  Aged Out   COVID-19 Vaccine  Discontinued

## 2023-06-30 DIAGNOSIS — H35372 Puckering of macula, left eye: Secondary | ICD-10-CM | POA: Diagnosis not present

## 2023-07-07 ENCOUNTER — Encounter: Payer: Self-pay | Admitting: Nurse Practitioner

## 2023-07-08 ENCOUNTER — Ambulatory Visit: Admitting: Sports Medicine

## 2023-10-06 ENCOUNTER — Ambulatory Visit: Payer: Medicare Other | Admitting: Nurse Practitioner

## 2023-10-06 ENCOUNTER — Encounter: Payer: Self-pay | Admitting: Nurse Practitioner

## 2023-10-06 VITALS — BP 130/80 | HR 68 | Temp 99.0°F | Resp 10 | Ht 63.0 in | Wt 158.6 lb

## 2023-10-06 DIAGNOSIS — E7800 Pure hypercholesterolemia, unspecified: Secondary | ICD-10-CM

## 2023-10-06 DIAGNOSIS — M150 Primary generalized (osteo)arthritis: Secondary | ICD-10-CM

## 2023-10-06 DIAGNOSIS — M810 Age-related osteoporosis without current pathological fracture: Secondary | ICD-10-CM

## 2023-10-06 DIAGNOSIS — K649 Unspecified hemorrhoids: Secondary | ICD-10-CM

## 2023-10-06 DIAGNOSIS — E039 Hypothyroidism, unspecified: Secondary | ICD-10-CM | POA: Diagnosis not present

## 2023-10-06 DIAGNOSIS — H9193 Unspecified hearing loss, bilateral: Secondary | ICD-10-CM | POA: Diagnosis not present

## 2023-10-06 NOTE — Progress Notes (Signed)
 Careteam: Patient Care Team: Verma Gobble, NP as PCP - General (Geriatric Medicine) Dorthey Gave, PA-C as Physician Assistant (Dermatology) Magrinat, Rozella Cornfield, MD (Inactive) as Consulting Physician (Oncology) Baldo Bonds, MD as Consulting Physician (Gastroenterology) Ric Chain (Optometry)  PLACE OF SERVICE:  [facility]  Advanced Directive information    Allergies  Allergen Reactions   Benadryl [Diphenhydramine Hcl] Other (See Comments)    Super hyper   Tape     Skin breakdown/redness    Boniva [Ibandronate Sodium] Other (See Comments)    Neck, shoulder limited mobility    Peridin-C [Ascorbic Acid] Diarrhea   Venlafaxine  Nausea Only   Chlorhexidine  Gluconate Itching   Codeine Other (See Comments)    can't wake up from it.   Sulfa Antibiotics Rash    Patient unsure of reaction    Chief Complaint  Patient presents with   Medical Management of Chronic Issues    6 month follow up     HPI:  Discussed the use of AI scribe software for clinical note transcription with the patient, who gave verbal consent to proceed.  History of Present Illness Sylvia [name] is a 78 year old female who presents for a six-month follow-up visit.  In April, she experienced a fall while washing her car. Despite the fall, she did not seek medical attention and noted no soreness the following day. Interestingly, she mentions that her chronic back issues seemed to improve after the fall.   Two days ago, she experienced a rectal bleeding episode, which she attributes to hemorrhoids due to prolonged sitting and driving. The bleeding occurred when she attempted to have a bowel movement after a few days of constipation. No current rectal pain and no further bleeding since.  Her cholesterol levels have been elevated in the past. Since returning from vacation, she has been attempting dietary changes, including reducing carbohydrate intake and purchasing keto  bread. She is currently on Zetia  for cholesterol management.  She has a history of osteoporosis and previously took one dose of Boniva, which she discontinued due to severe side effects that left her unable to function. She engages in weight-bearing exercises, such as walking for thirty minutes five days a week, and takes calcium and vitamin D  supplements.  She underwent a hearing test and was advised to get hearing aids, but has not purchased them due to cost. She reports no issues with hearing in small groups.  She received a Tdap vaccination recently   Review of Systems:  Review of Systems  Constitutional:  Negative for chills, fever and weight loss.  HENT:  Positive for hearing loss. Negative for tinnitus.   Respiratory:  Negative for cough, sputum production and shortness of breath.   Cardiovascular:  Negative for chest pain, palpitations and leg swelling.  Gastrointestinal:  Negative for abdominal pain, constipation, diarrhea and heartburn.  Genitourinary:  Negative for dysuria, frequency and urgency.  Musculoskeletal:  Negative for back pain, falls, joint pain and myalgias.  Skin: Negative.   Neurological:  Negative for dizziness and headaches.  Psychiatric/Behavioral:  Negative for depression and memory loss. The patient does not have insomnia.     Past Medical History:  Diagnosis Date   Aphasia    Arthritis    OA- fingers, toes, back pain, hip pain    Breast cancer of upper-outer quadrant of left female breast (HCC)    left breast cancer   Complication of anesthesia    slow to wake up  Disorder of bone and cartilage, unspecified    Diverticulitis    Hypothyroid    Lumbago    Malignant neoplasm of breast (female), unspecified site    Normal nuclear stress test    [facility], 07/18/2011   Osteoarthritis (arthritis due to wear and tear of joints)    Pain in joint, pelvic region and thigh    Reflux esophagitis    pt. knowledgeable about dietary intake relative to  reflux   S/P bilateral breast biopsy 2013   Shortness of breath    /w housework   Symptomatic menopausal or female climacteric states    Past Surgical History:  Procedure Laterality Date   APPENDECTOMY     BREAST BIOPSY  07/26/2011   Procedure: BREAST BIOPSY WITH NEEDLE LOCALIZATION;  Surgeon: Enid Harry, MD;  Location: [facility] OR;  Service: General;  Laterality: Right;  Right breast wire guided biospy Needle localization @ [facility]  7:30   BREAST SURGERY     Procedure: BREAST BIOPSY WITH NEEDLE LOCALIZATION;  Sur   CATARACT EXTRACTION  2012   DILATION AND CURETTAGE OF UTERUS  1978   ECTOPIC PREGNANCY SURGERY     1980   EYE SURGERY     R cat. extracted /w IOL   LAPAROSCOPIC APPENDECTOMY N/A 03/03/2019   Procedure: APPENDECTOMY LAPAROSCOPIC;  Surgeon: Oralee Billow, MD;  Location: [facility] ORS;  Service: General;  Laterality: N/A;   OVARIAN CYST SURGERY     TONSILLECTOMY     as a child   Social History:   reports that she quit smoking about 43 years ago. Her smoking use included cigarettes. She has never used smokeless tobacco. She reports that she does not drink alcohol and does not use drugs.  Family History  Problem Relation Age of Onset   Cancer Sister        colon   Cancer Brother        renal   Heart disease Brother    Cancer Mother        leukemia   Peripheral vascular disease Mother    Cancer Father        lung   Heart attack Brother    Cancer Maternal Aunt        breast   Cancer Cousin    Cancer Cousin    Anesthesia problems Neg Hx     Medications: Patient's Medications  New Prescriptions   No medications on file  Previous Medications   ACETAMINOPHEN  (TYLENOL ) 500 MG TABLET    Take 2 tablets (1,000 mg total) by mouth every 8 (eight) hours as needed for mild pain.   CA PHOSPHATE-CHOLECALCIFEROL (CALCIUM WITH D3) 250-12.5 MG-MCG CHEW    Chew 1 Chip by mouth daily.   CHOLECALCIFEROL (D3 5000) 125 MCG (5000 UT) CAPSULE    Take 5,000 Units by mouth daily.    CYANOCOBALAMIN (VITAMIN B-12) 1000 MCG SUBL    Place 1 tablet under the tongue daily. At breakfast   DIPHENHYDRAMINE-ACETAMINOPHEN  (TYLENOL  PM) 25-500 MG TABS TABLET    Take 1 tablet by mouth at bedtime as needed.   EZETIMIBE  (ZETIA ) 10 MG TABLET    TAKE 1 TABLET BY MOUTH EVERY DAY   MAGNESIUM 250 MG TABS    Take by mouth daily at 6 (six) AM.   OVER THE COUNTER MEDICATION    Take 1 capsule by mouth daily. Serotonin Mood Support   SYNTHROID  75 MCG TABLET    TAKE 1 TABLET BY MOUTH EVERY DAY BEFORE BREAKFAST   VITAMIN  E 400 UNIT CAPSULE    Take 800 Units by mouth daily.   Modified Medications   No medications on file  Discontinued Medications   No medications on file    Physical Exam:  Vitals:   10/06/23 1325  BP: (!) 142/88  Pulse: 68  Resp: 10  Temp: 99 F (37.2 C)  TempSrc: Temporal  SpO2: 95%  Weight: 158 lb 9.6 oz (71.9 kg)  Height: 5' 3 (1.6 m)   Body mass index is 28.09 kg/m. Wt Readings from Last 3 Encounters:  10/06/23 158 lb 9.6 oz (71.9 kg)  06/02/23 159 lb (72.1 kg)  04/04/23 158 lb (71.7 kg)    Physical Exam Constitutional:      General: She is not in acute distress.    Appearance: She is well-developed. She is not diaphoretic.  HENT:     Head: Normocephalic and atraumatic.     Mouth/Throat:     Pharynx: No oropharyngeal exudate.   Eyes:     Conjunctiva/sclera: Conjunctivae normal.     Pupils: Pupils are equal, round, and reactive to light.    Cardiovascular:     Rate and Rhythm: Normal rate and regular rhythm.     Heart sounds: Normal heart sounds.  Pulmonary:     Effort: Pulmonary effort is normal.     Breath sounds: Normal breath sounds.  Abdominal:     General: Bowel sounds are normal.     Palpations: Abdomen is soft.   Musculoskeletal:     Cervical back: Normal range of motion and neck supple.     Right lower leg: No edema.     Left lower leg: No edema.   Skin:    General: Skin is warm and dry.   Neurological:     Mental Status: She  is alert.   Psychiatric:        Mood and Affect: Mood normal.     Labs reviewed: Basic Metabolic Panel: Recent Labs    04/04/23 1456  NA 139  K 4.6  CL 105  CO2 27  GLUCOSE 90  BUN 12  CREATININE 0.72  CALCIUM 8.9  TSH 3.21   Liver Function Tests: Recent Labs    04/04/23 1456  AST 15  ALT 12  BILITOT 0.4  PROT 6.9   No results for input(s): LIPASE, AMYLASE in the last 8760 hours. No results for input(s): AMMONIA in the last 8760 hours. CBC: Recent Labs    04/04/23 1456  WBC 4.8  NEUTROABS 2,650  HGB 13.8  HCT 41.1  MCV 92.4  PLT 239   Lipid Panel: Recent Labs    04/04/23 1456  CHOL 214*  HDL 63  LDLCALC 120*  TRIG 184*  CHOLHDL 3.4   TSH: Recent Labs    04/04/23 1456  TSH 3.21   A1C: No results found for: HGBA1C   Assessment/Plan  Acquired hypothyroidism Assessment & Plan: Continues on synthroid  75 mcg daily Follow tsh yearly   Pure hypercholesterolemia Assessment & Plan: Elevated cholesterol. Discussed statin therapy, specifically Crestor, and her concerns about side effects. Emphasized cardiovascular risk management. - Check lipid panel. - Discuss results and consider statin therapy if indicated.  Orders: -     Lipid panel -     COMPLETE METABOLIC PANEL WITHOUT GFR -     CBC with Differential/Platelet  Primary osteoarthritis involving multiple joints Assessment & Plan: Stable at this time, continue lifestyle modifications  Orders: -     COMPLETE METABOLIC PANEL WITHOUT GFR -  CBC with Differential/Platelet  Osteoporosis, unspecified osteoporosis type, unspecified pathological fracture presence Assessment & Plan: Noted on bone density  Experienced side effects from Boniva. Discussed fracture risks and future monitoring. - Repeat bone density scan in March 2025.  Orders: -     COMPLETE METABOLIC PANEL WITHOUT GFR  Hemorrhoids, unspecified hemorrhoid type Assessment & Plan: Recent episodes of bleeding due  to hemorrhoids but symptoms well controlled at this time.    Bilateral hearing loss, unspecified hearing loss type Assessment & Plan: Recommended to get hearing aids however due to cost did not obtain She is still able to hear well in non-crowded settings.      Return in about 6 months (around 04/06/2024) for routine follow up.  Sylvia [name] Fisherman [facility] [phone]

## 2023-10-07 ENCOUNTER — Ambulatory Visit: Payer: Self-pay | Admitting: Nurse Practitioner

## 2023-10-13 DIAGNOSIS — H9193 Unspecified hearing loss, bilateral: Secondary | ICD-10-CM | POA: Insufficient documentation

## 2023-10-13 DIAGNOSIS — M810 Age-related osteoporosis without current pathological fracture: Secondary | ICD-10-CM | POA: Insufficient documentation

## 2023-10-13 DIAGNOSIS — K649 Unspecified hemorrhoids: Secondary | ICD-10-CM | POA: Insufficient documentation

## 2023-10-13 NOTE — Assessment & Plan Note (Signed)
 Recent episodes of bleeding due to hemorrhoids but symptoms well controlled at this time.

## 2023-10-13 NOTE — Assessment & Plan Note (Signed)
 Noted on bone density  Experienced side effects from Boniva. Discussed fracture risks and future monitoring. - Repeat bone density scan in March 2025.

## 2023-10-13 NOTE — Assessment & Plan Note (Signed)
 Elevated cholesterol. Discussed statin therapy, specifically Crestor, and her concerns about side effects. Emphasized cardiovascular risk management. - Check lipid panel. - Discuss results and consider statin therapy if indicated.

## 2023-10-13 NOTE — Assessment & Plan Note (Signed)
 Continues on synthroid  75 mcg daily Follow tsh yearly

## 2023-10-13 NOTE — Assessment & Plan Note (Signed)
 Stable at this time, continue lifestyle modifications

## 2023-10-13 NOTE — Assessment & Plan Note (Signed)
 Recommended to get hearing aids however due to cost did not obtain She is still able to hear well in non-crowded settings.

## 2023-10-26 ENCOUNTER — Other Ambulatory Visit: Payer: Self-pay | Admitting: Nurse Practitioner

## 2023-10-26 DIAGNOSIS — E7800 Pure hypercholesterolemia, unspecified: Secondary | ICD-10-CM

## 2024-01-27 DIAGNOSIS — Z124 Encounter for screening for malignant neoplasm of cervix: Secondary | ICD-10-CM | POA: Diagnosis not present

## 2024-01-27 DIAGNOSIS — Z01419 Encounter for gynecological examination (general) (routine) without abnormal findings: Secondary | ICD-10-CM | POA: Diagnosis not present

## 2024-01-27 DIAGNOSIS — Z1151 Encounter for screening for human papillomavirus (HPV): Secondary | ICD-10-CM | POA: Diagnosis not present

## 2024-01-27 DIAGNOSIS — Z6829 Body mass index (BMI) 29.0-29.9, adult: Secondary | ICD-10-CM | POA: Diagnosis not present

## 2024-02-26 ENCOUNTER — Encounter: Payer: Self-pay | Admitting: Nurse Practitioner

## 2024-02-26 DIAGNOSIS — Z23 Encounter for immunization: Secondary | ICD-10-CM | POA: Diagnosis not present

## 2024-03-04 LAB — CBC WITH DIFFERENTIAL/PLATELET
Absolute Lymphocytes: 1910 {cells}/uL (ref 850–3900)
Absolute Monocytes: 388 {cells}/uL (ref 200–950)
Basophils Absolute: 40 {cells}/uL (ref 0–200)
Basophils Relative: 0.7 %
Eosinophils Absolute: 40 {cells}/uL (ref 15–500)
Eosinophils Relative: 0.7 %
HCT: 42.6 % (ref 35.0–45.0)
Hemoglobin: 14.3 g/dL (ref 11.7–15.5)
MCH: 31.4 pg (ref 27.0–33.0)
MCHC: 33.6 g/dL (ref 32.0–36.0)
MCV: 93.4 fL (ref 80.0–100.0)
MPV: 9.6 fL (ref 7.5–12.5)
Monocytes Relative: 6.8 %
Neutro Abs: 3323 {cells}/uL (ref 1500–7800)
Neutrophils Relative %: 58.3 %
Platelets: 239 Thousand/uL (ref 140–400)
RBC: 4.56 Million/uL (ref 3.80–5.10)
RDW: 13 % (ref 11.0–15.0)
Total Lymphocyte: 33.5 %
WBC: 5.7 Thousand/uL (ref 3.8–10.8)

## 2024-03-04 LAB — COMPLETE METABOLIC PANEL WITHOUT GFR
AG Ratio: 1.8 (calc) (ref 1.0–2.5)
ALT: 12 U/L (ref 6–29)
AST: 17 U/L (ref 10–35)
Albumin: 4.5 g/dL (ref 3.6–5.1)
Alkaline phosphatase (APISO): 54 U/L (ref 37–153)
BUN: 18 mg/dL (ref 7–25)
CO2: 23 mmol/L (ref 20–32)
Calcium: 8.9 mg/dL (ref 8.6–10.4)
Chloride: 105 mmol/L (ref 98–110)
Creat: 0.77 mg/dL (ref 0.60–1.00)
Globulin: 2.5 g/dL (ref 1.9–3.7)
Glucose, Bld: 79 mg/dL (ref 65–139)
Potassium: 4.2 mmol/L (ref 3.5–5.3)
Sodium: 138 mmol/L (ref 135–146)
Total Bilirubin: 0.5 mg/dL (ref 0.2–1.2)
Total Protein: 7 g/dL (ref 6.1–8.1)

## 2024-03-04 LAB — LIPID PANEL
Cholesterol: 177 mg/dL (ref <200)
HDL: 67 mg/dL (ref >=50)
LDL Cholesterol (Calc): 89 mg/dL
Non-HDL Cholesterol (Calc): 110 mg/dL (ref <130)
Total CHOL/HDL Ratio: 2.6 (calc) (ref <5.0)
Triglycerides: 111 mg/dL (ref <150)

## 2024-03-17 ENCOUNTER — Other Ambulatory Visit: Payer: Self-pay | Admitting: Nurse Practitioner

## 2024-03-17 DIAGNOSIS — E039 Hypothyroidism, unspecified: Secondary | ICD-10-CM

## 2024-04-12 ENCOUNTER — Ambulatory Visit: Payer: Self-pay | Admitting: Nurse Practitioner

## 2024-04-12 ENCOUNTER — Encounter: Payer: Self-pay | Admitting: Nurse Practitioner

## 2024-04-12 VITALS — BP 126/72 | HR 76 | Temp 97.7°F | Resp 18 | Ht 63.0 in | Wt 159.0 lb

## 2024-04-12 DIAGNOSIS — E7800 Pure hypercholesterolemia, unspecified: Secondary | ICD-10-CM

## 2024-04-12 DIAGNOSIS — E039 Hypothyroidism, unspecified: Secondary | ICD-10-CM | POA: Diagnosis not present

## 2024-04-12 DIAGNOSIS — G4701 Insomnia due to medical condition: Secondary | ICD-10-CM

## 2024-04-12 DIAGNOSIS — G629 Polyneuropathy, unspecified: Secondary | ICD-10-CM | POA: Diagnosis not present

## 2024-04-12 DIAGNOSIS — M810 Age-related osteoporosis without current pathological fracture: Secondary | ICD-10-CM

## 2024-04-12 MED ORDER — PREGABALIN 50 MG PO CAPS: 50.0000 mg | ORAL_CAPSULE | Freq: Every day | ORAL | 1 refills | Status: AC

## 2024-04-12 NOTE — Progress Notes (Signed)
 Careteam: Patient Care Team: Caro Harlene POUR, NP as PCP - General (Geriatric Medicine) Porter Andrez SAUNDERS, PA-C (Inactive) as Physician Assistant (Dermatology) Dianna Specking, MD as Consulting Physician (Gastroenterology) Elma Zachary RAMAN [facility])  PLACE OF SERVICE:  [facility]  Advanced Directive information    Allergies[1]  Chief Complaint  Patient presents with   Medical Management of Chronic Issues    Six Month Follow-up, needs mammogram last one was done Apr 17, 2023 at [facility]. Patient has a mammogram appointment on next Monday.    HPI:  Discussed the use of AI scribe software for clinical note transcription with the patient, who gave verbal consent to proceed.  History of Present Illness Sylvia [name] is a 78 year old female who presents with sleep disturbances and burning feet.  She experiences burning sensations on the bottoms of her feet at night, which are painful and disrupt her sleep. The burning does not affect the tops of her feet and is not noticeable during the day. She has tried various treatments without relief, including gabapentin , and is hesitant to increase the dosage, she has tried OTC patches, magnesium spray  She has difficulty sleeping due to the burning feet and if she gets a good night sleep will sleep until 10 AM or later, which concerns her daughter. She recalls trying a medication a few years ago that left her feeling groggy in the morning and avoids taking Benadryl due to its side effects. Trazodone  was previously used for sleep, but she does not recall its effectiveness.  She has a history of breast cancer treated with tamoxifen  for five years, but no chemotherapy. She does not havediabetes.   She is currently on Synthroid  75 mcg for thyroid  management.   On Zetia  for cholesterol.   She is not taking calcium supplements due to gastrointestinal side effects but tries to incorporate calcium into her diet. She has a  history of osteoporosis and is not currently on any specific osteoporosis medication due to past side effects from Boniva. A repeat bone density scan is scheduled for March 2026.  She engages in regular physical activity, including walking with a friend, and is interested in joining a senior program at [employer] for indoor walking. She is trying to manage her weight but finds it challenging.   No chest pain, palpitations, cough, congestion, abnormal bowel movements, blood in urine, or vaginal bleeding. Reports arthritis pain managed occasionally with Tylenol .    Review of Systems:  Review of Systems  Constitutional:  Negative for chills, fever and weight loss.  HENT:  Negative for tinnitus.   Respiratory:  Negative for cough, sputum production and shortness of breath.   Cardiovascular:  Negative for chest pain, palpitations and leg swelling.  Gastrointestinal:  Negative for abdominal pain, constipation, diarrhea and heartburn.  Genitourinary:  Negative for dysuria, frequency and urgency.  Musculoskeletal:  Negative for back pain, falls, joint pain and myalgias.  Skin: Negative.   Neurological:  Positive for sensory change. Negative for dizziness and headaches.  Psychiatric/Behavioral:  Negative for depression and memory loss. The patient does not have insomnia.     Past Medical History:  Diagnosis Date   Aphasia    Arthritis    OA- fingers, toes, back pain, hip pain    Breast cancer of upper-outer quadrant of left female breast (HCC)    left breast cancer   Complication of anesthesia    slow to wake up   Disorder of bone and cartilage,  unspecified    Diverticulitis    Hypothyroid    Lumbago    Malignant neoplasm of breast (female), unspecified site    Normal nuclear stress test    [facility], 07/18/2011   Osteoarthritis (arthritis due to wear and tear of joints)    Pain in joint, pelvic region and thigh    Reflux esophagitis    pt. knowledgeable about dietary intake  relative to reflux   S/P bilateral breast biopsy 2013   Shortness of breath    /w housework   Symptomatic menopausal or female climacteric states    Past Surgical History:  Procedure Laterality Date   APPENDECTOMY     BREAST BIOPSY  07/26/2011   Procedure: BREAST BIOPSY WITH NEEDLE LOCALIZATION;  Surgeon: Donnice Bury, MD;  Location: [facility] OR;  Service: General;  Laterality: Right;  Right breast wire guided biospy Needle localization @ [facility]  7:30   BREAST SURGERY     Procedure: BREAST BIOPSY WITH NEEDLE LOCALIZATION;  Sur   CATARACT EXTRACTION  2012   DILATION AND CURETTAGE OF UTERUS  1978   ECTOPIC PREGNANCY SURGERY     1980   EYE SURGERY     R cat. extracted /w IOL   LAPAROSCOPIC APPENDECTOMY N/A 03/03/2019   Procedure: APPENDECTOMY LAPAROSCOPIC;  Surgeon: Eletha Boas, MD;  Location: [facility] ORS;  Service: General;  Laterality: N/A;   OVARIAN CYST SURGERY     TONSILLECTOMY     as a child   Social History:   reports that she quit smoking about 43 years ago. Her smoking use included cigarettes. She smoked an average of 0.3 packs per day. She has never used smokeless tobacco. She reports that she does not drink alcohol and does not use drugs.  Family History  Problem Relation Age of Onset   Cancer Sister        colon   Cancer Brother        renal   Heart disease Brother    Cancer Mother        leukemia   Peripheral vascular disease Mother    Cancer Father        lung   Heart attack Brother    Cancer Maternal Aunt        breast   Cancer Cousin    Cancer Cousin    Anesthesia problems Neg Hx     Medications: Patient's Medications  New Prescriptions   No medications on file  Previous Medications   ACETAMINOPHEN  (TYLENOL ) 500 MG TABLET    Take 2 tablets (1,000 mg total) by mouth every 8 (eight) hours as needed for mild pain.   CA PHOSPHATE-CHOLECALCIFEROL (CALCIUM WITH D3) 250-12.5 MG-MCG CHEW    Chew 1 Chip by mouth daily.   CHOLECALCIFEROL (D3 5000) 125 MCG (5000 UT)  CAPSULE    Take 5,000 Units by mouth daily.   CYANOCOBALAMIN (VITAMIN B-12) 1000 MCG SUBL    Place 1 tablet under the tongue daily. At breakfast   DIPHENHYDRAMINE-ACETAMINOPHEN  (TYLENOL  PM) 25-500 MG TABS TABLET    Take 1 tablet by mouth at bedtime as needed.   EZETIMIBE  (ZETIA ) 10 MG TABLET    TAKE 1 TABLET BY MOUTH EVERY DAY   LEVOTHYROXINE  (SYNTHROID ) 75 MCG TABLET    TAKE 1 TABLET BY MOUTH EVERY DAY BEFORE BREAKFAST   MAGNESIUM 250 MG TABS    Take by mouth daily at 6 (six) AM.   OVER THE COUNTER MEDICATION    Take 1 capsule by mouth daily. Serotonin  Mood Support   VITAMIN E 400 UNIT CAPSULE    Take 800 Units by mouth daily.   Modified Medications   No medications on file  Discontinued Medications   No medications on file    Physical Exam:  Vitals:   04/12/24 1310  BP: 126/72  Pulse: 76  Resp: 18  Temp: 97.7 F (36.5 C)  SpO2: 97%  Weight: 159 lb (72.1 kg)  Height: 5' 3 (1.6 m)   Body mass index is 28.17 kg/m. Wt Readings from Last 3 Encounters:  04/12/24 159 lb (72.1 kg)  10/06/23 158 lb 9.6 oz (71.9 kg)  06/02/23 159 lb (72.1 kg)    Physical Exam Constitutional:      General: She is not in acute distress.    Appearance: She is well-developed. She is not diaphoretic.  HENT:     Head: Normocephalic and atraumatic.     Mouth/Throat:     Pharynx: No oropharyngeal exudate.  Eyes:     Conjunctiva/sclera: Conjunctivae normal.     Pupils: Pupils are equal, round, and reactive to light.  Cardiovascular:     Rate and Rhythm: Normal rate and regular rhythm.     Heart sounds: Normal heart sounds.  Pulmonary:     Effort: Pulmonary effort is normal.     Breath sounds: Normal breath sounds.  Abdominal:     General: Bowel sounds are normal.     Palpations: Abdomen is soft.  Musculoskeletal:     Cervical back: Normal range of motion and neck supple.     Right lower leg: No edema.     Left lower leg: No edema.  Skin:    General: Skin is warm and dry.  Neurological:      Mental Status: She is alert.  Psychiatric:        Mood and Affect: Mood normal.     Labs reviewed: Basic Metabolic Panel: Recent Labs    10/06/23 1403  NA 138  K 4.2  CL 105  CO2 23  GLUCOSE 79  BUN 18  CREATININE 0.77  CALCIUM 8.9   Liver Function Tests: Recent Labs    10/06/23 1403  AST 17  ALT 12  BILITOT 0.5  PROT 7.0   No results for input(s): LIPASE, AMYLASE in the last 8760 hours. No results for input(s): AMMONIA in the last 8760 hours. CBC: Recent Labs    10/06/23 1403  WBC 5.7  NEUTROABS 3,323  HGB 14.3  HCT 42.6  MCV 93.4  PLT 239   Lipid Panel: Recent Labs    10/06/23 1403  CHOL 177  HDL 67  LDLCALC 89  TRIG 111  CHOLHDL 2.6   TSH: No results for input(s): TSH in the last 8760 hours. A1C: No results found for: HGBA1C   Assessment/Plan  Assessment & Plan polyneuropathy Burning sensation in feet at night, suggestive of neuropathy. No diabetes or chemotherapy history. Previous gabapentin  ineffective. - Prescribed Lyrica  50 mg at night, option to increase to 100 mg if needed. - Instructed to monitor for side effects: dizziness, somnolence, weight gain, edema. -plans to mychart with questions or concerns  Insomnia due to medical condition Insomnia likely secondary to neuropathic pain. Previous trazodone  caused morning grogginess. Lyrica  may aid sleep by alleviating neuropathic pain. - Prescribed Lyrica  as above to address neuropathic pain and potentially improve sleep.  Acquired hypothyroidism Currently on Synthroid  75 mcg. Thyroid  function to be monitored with upcoming labs. - Ordered thyroid  function tests.  Pure hypercholesterolemia Currently on Zetia .  Recent fasting cholesterol checked in June. - Ordered routine labs including electrolytes, kidney, and liver function tests.  Osteoporosis Not taking calcium due to constipation. Previous Boniva side effects. Bone density scan scheduled for March 2026. - Continue  monitoring osteoporosis with dietary calcium intake.     Return in about 6 months (around 10/11/2024) for routine follow up.  Marvelyn Bouchillon K. Caro BODILY [facility] [phone]      [1]  Allergies Allergen Reactions   Benadryl [Diphenhydramine Hcl] Other (See Comments)    Super hyper   Tape     Skin breakdown/redness    Boniva [Ibandronate Sodium] Other (See Comments)    Neck, shoulder limited mobility    Peridin-C [Ascorbic Acid] Diarrhea   Venlafaxine  Nausea Only   Chlorhexidine  Gluconate Itching   Codeine Other (See Comments)    can't wake up from it.   Sulfa Antibiotics Rash    Patient unsure of reaction

## 2024-04-13 ENCOUNTER — Other Ambulatory Visit: Payer: Self-pay | Admitting: Nurse Practitioner

## 2024-04-13 ENCOUNTER — Ambulatory Visit: Payer: Self-pay | Admitting: Nurse Practitioner

## 2024-04-13 DIAGNOSIS — E7800 Pure hypercholesterolemia, unspecified: Secondary | ICD-10-CM

## 2024-04-13 LAB — COMPREHENSIVE METABOLIC PANEL WITH GFR
AG Ratio: 1.7 (calc) (ref 1.0–2.5)
ALT: 11 U/L (ref 6–29)
AST: 15 U/L (ref 10–35)
Albumin: 4.5 g/dL (ref 3.6–5.1)
Alkaline phosphatase (APISO): 50 U/L (ref 37–153)
BUN: 11 mg/dL (ref 7–25)
CO2: 28 mmol/L (ref 20–32)
Calcium: 9.4 mg/dL (ref 8.6–10.4)
Chloride: 106 mmol/L (ref 98–110)
Creat: 0.82 mg/dL (ref 0.60–1.00)
Globulin: 2.7 g/dL (ref 1.9–3.7)
Glucose, Bld: 81 mg/dL (ref 65–139)
Potassium: 5 mmol/L (ref 3.5–5.3)
Sodium: 141 mmol/L (ref 135–146)
Total Bilirubin: 0.6 mg/dL (ref 0.2–1.2)
Total Protein: 7.2 g/dL (ref 6.1–8.1)
eGFR: 73 mL/min/1.73m2 (ref >=60)

## 2024-04-13 LAB — CBC WITH DIFFERENTIAL/PLATELET
Absolute Lymphocytes: 1382 {cells}/uL (ref 850–3900)
Absolute Monocytes: 352 {cells}/uL (ref 200–950)
Basophils Absolute: 40 {cells}/uL (ref 0–200)
Basophils Relative: 0.9 %
Eosinophils Absolute: 79 {cells}/uL (ref 15–500)
Eosinophils Relative: 1.8 %
HCT: 43.1 % (ref 35.9–46.0)
Hemoglobin: 14.2 g/dL (ref 11.7–15.5)
MCH: 30.5 pg (ref 27.0–33.0)
MCHC: 32.9 g/dL (ref 31.6–35.4)
MCV: 92.5 fL (ref 81.4–101.7)
MPV: 9.6 fL (ref 7.5–12.5)
Monocytes Relative: 8 %
Neutro Abs: 2548 {cells}/uL (ref 1500–7800)
Neutrophils Relative %: 57.9 %
Platelets: 257 Thousand/uL (ref 140–400)
RBC: 4.66 Million/uL (ref 3.80–5.10)
RDW: 13.3 % (ref 11.0–15.0)
Total Lymphocyte: 31.4 %
WBC: 4.4 Thousand/uL (ref 3.8–10.8)

## 2024-04-13 LAB — TSH: TSH: 6.18 m[IU]/L — ABNORMAL HIGH (ref 0.40–4.50)

## 2024-04-26 LAB — HM MAMMOGRAPHY

## 2024-05-03 ENCOUNTER — Encounter: Payer: Self-pay | Admitting: Nurse Practitioner

## 2024-06-04 ENCOUNTER — Encounter: Payer: Medicare Other | Admitting: Nurse Practitioner

## 2024-06-28 ENCOUNTER — Ambulatory Visit: Admitting: Nurse Practitioner

## 2024-09-24 ENCOUNTER — Ambulatory Visit: Admitting: Nurse Practitioner

## 2024-09-27 ENCOUNTER — Ambulatory Visit: Admitting: Nurse Practitioner
